# Patient Record
Sex: Male | Born: 1965 | Race: White | Hispanic: No | Marital: Single | Smoking: Never smoker
Health system: Southern US, Community
[De-identification: ages and names within clinical notes are randomized; demographics above are authoritative.]

## PROBLEM LIST (undated history)

## (undated) DIAGNOSIS — I10 Essential (primary) hypertension: Secondary | ICD-10-CM

## (undated) DIAGNOSIS — R569 Unspecified convulsions: Secondary | ICD-10-CM

## (undated) DIAGNOSIS — I639 Cerebral infarction, unspecified: Secondary | ICD-10-CM

## (undated) HISTORY — PX: NERVE SURGERY: SHX1016

## (undated) HISTORY — PX: FRACTURE SURGERY: SHX138

---

## 2006-12-08 ENCOUNTER — Ambulatory Visit: Payer: Self-pay | Admitting: Family Medicine

## 2006-12-13 ENCOUNTER — Ambulatory Visit: Payer: Self-pay | Admitting: Family Medicine

## 2009-11-28 ENCOUNTER — Ambulatory Visit: Payer: Self-pay | Admitting: Neurology

## 2009-12-11 ENCOUNTER — Ambulatory Visit: Payer: Self-pay | Admitting: Neurology

## 2010-07-16 ENCOUNTER — Emergency Department: Payer: Self-pay | Admitting: Unknown Physician Specialty

## 2010-09-09 ENCOUNTER — Ambulatory Visit: Payer: Self-pay | Admitting: Internal Medicine

## 2010-09-10 ENCOUNTER — Ambulatory Visit: Payer: Self-pay | Admitting: Internal Medicine

## 2011-09-06 ENCOUNTER — Emergency Department: Payer: Self-pay | Admitting: Emergency Medicine

## 2011-09-06 LAB — CARBAMAZEPINE LEVEL, TOTAL: Carbamazepine: 10.6 ug/mL (ref 4.0–12.0)

## 2011-12-21 ENCOUNTER — Emergency Department: Payer: Self-pay | Admitting: Emergency Medicine

## 2012-08-05 ENCOUNTER — Ambulatory Visit: Payer: Self-pay | Admitting: Internal Medicine

## 2015-01-27 ENCOUNTER — Other Ambulatory Visit: Payer: Self-pay | Admitting: Neurosurgery

## 2015-01-30 ENCOUNTER — Encounter (HOSPITAL_COMMUNITY): Payer: Self-pay

## 2015-01-30 ENCOUNTER — Encounter (HOSPITAL_COMMUNITY)
Admission: RE | Admit: 2015-01-30 | Discharge: 2015-01-30 | Disposition: A | Discharge status: Home / Self Care | Payer: Medicare Other | Source: Ambulatory Visit | Attending: Neurosurgery | Admitting: Neurosurgery

## 2015-01-30 DIAGNOSIS — Z79899 Other long term (current) drug therapy: Secondary | ICD-10-CM | POA: Diagnosis not present

## 2015-01-30 DIAGNOSIS — Z01818 Encounter for other preprocedural examination: Secondary | ICD-10-CM | POA: Diagnosis present

## 2015-01-30 DIAGNOSIS — G40209 Localization-related (focal) (partial) symptomatic epilepsy and epileptic syndromes with complex partial seizures, not intractable, without status epilepticus: Secondary | ICD-10-CM | POA: Insufficient documentation

## 2015-01-30 DIAGNOSIS — Z01812 Encounter for preprocedural laboratory examination: Secondary | ICD-10-CM | POA: Diagnosis not present

## 2015-01-30 HISTORY — DX: Unspecified convulsions: R56.9

## 2015-01-30 LAB — BASIC METABOLIC PANEL
Anion gap: 7 (ref 5–15)
BUN: 5 mg/dL — ABNORMAL LOW (ref 6–20)
CALCIUM: 9.3 mg/dL (ref 8.9–10.3)
CO2: 30 mmol/L (ref 22–32)
CREATININE: 0.69 mg/dL (ref 0.61–1.24)
Chloride: 91 mmol/L — ABNORMAL LOW (ref 101–111)
GFR calc non Af Amer: >60 mL/min (ref >60)
Glucose, Bld: 96 mg/dL (ref 65–99)
Potassium: 4.1 mmol/L (ref 3.5–5.1)
SODIUM: 128 mmol/L — AB (ref 135–145)

## 2015-01-30 LAB — CBC
HEMATOCRIT: 40.7 % (ref 39.0–52.0)
Hemoglobin: 13.8 g/dL (ref 13.0–17.0)
MCH: 29.7 pg (ref 26.0–34.0)
MCHC: 33.9 g/dL (ref 30.0–36.0)
MCV: 87.5 fL (ref 78.0–100.0)
PLATELETS: 354 10*3/uL (ref 150–400)
RBC: 4.65 MIL/uL (ref 4.22–5.81)
RDW: 13.1 % (ref 11.5–15.5)
WBC: 6.5 10*3/uL (ref 4.0–10.5)

## 2015-01-30 NOTE — Pre-Procedure Instructions (Signed)
    JARE BIGOS  01/30/2015      [facility], [place] [address] Phone: [phone] Fax: [phone]    Your procedure is scheduled on February 03, 2015  Report to [facility] Admitting at 9:30 A.M.  Call this number if you have problems the morning of surgery:  [phone]   Remember:  Do not eat food or drink liquids after midnight.  Take these medicines the morning of surgery with A SIP OF WATER :carbamazepine (TEGRETOL XR), lamoTRIgine (LAMICTAL)   STOP ASPIRIN, NSAIDS (ALEVE, ADVIL, IBUPROFEN) HERBAL MEDICATIONS ONE WEEK PRIOR TO SURGERY   Do not wear jewelry.  Do not wear lotions, powders, or colognes.  You may NOT wear deodorant.  Men may shave face and neck.  Do not bring valuables to the hospital.  [facility] is not responsible for any belongings or valuables.  Contacts, dentures or bridgework may not be worn into surgery.  Leave your suitcase in the car.  After surgery it may be brought to your room.  For patients admitted to the hospital, discharge time will be determined by your treatment team.  Patients discharged the day of surgery will not be allowed to drive home.   Name and phone number of your driver:    Special instructions:  "PREPARING FOR SURGERY"  Please read over the following fact sheets that you were given. Pain Booklet, Coughing and Deep Breathing and Surgical Site Infection Prevention
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
## 2015-02-03 ENCOUNTER — Ambulatory Visit (HOSPITAL_COMMUNITY): Payer: Medicare Other | Admitting: Vascular Surgery

## 2015-02-03 ENCOUNTER — Ambulatory Visit (HOSPITAL_COMMUNITY)
Admission: RE | Admit: 2015-02-03 | Discharge: 2015-02-03 | Disposition: A | Discharge status: Home / Self Care | Payer: Medicare Other | Source: Ambulatory Visit | Attending: Neurosurgery | Admitting: Neurosurgery

## 2015-02-03 ENCOUNTER — Encounter (HOSPITAL_COMMUNITY)
Admission: RE | Disposition: A | Discharge status: Home / Self Care | Payer: Self-pay | Source: Ambulatory Visit | Attending: Neurosurgery

## 2015-02-03 DIAGNOSIS — Z79899 Other long term (current) drug therapy: Secondary | ICD-10-CM | POA: Insufficient documentation

## 2015-02-03 DIAGNOSIS — G40919 Epilepsy, unspecified, intractable, without status epilepticus: Secondary | ICD-10-CM | POA: Insufficient documentation

## 2015-02-03 HISTORY — PX: VAGUS NERVE STIMULATOR INSERTION: SHX348

## 2015-02-03 LAB — POCT I-STAT 4, (NA,K, GLUC, HGB,HCT)
GLUCOSE: 94 mg/dL (ref 65–99)
HEMATOCRIT: 47 % (ref 39.0–52.0)
HEMOGLOBIN: 16 g/dL (ref 13.0–17.0)
Potassium: 3.7 mmol/L (ref 3.5–5.1)
Sodium: 129 mmol/L — ABNORMAL LOW (ref 135–145)

## 2015-02-03 SURGERY — VAGAL NERVE STIMULATOR IMPLANT
Anesthesia: General

## 2015-02-03 MED ORDER — PHENYLEPHRINE HCL 10 MG/ML IJ SOLN
10.0000 mg | INTRAMUSCULAR | Status: DC | PRN
  Administered 2015-02-03: 15 ug/min via INTRAVENOUS

## 2015-02-03 MED ORDER — BUPIVACAINE HCL 0.5 % IJ SOLN
INTRAMUSCULAR | Status: DC | PRN
  Administered 2015-02-03: 18 mL

## 2015-02-03 MED ORDER — OXYCODONE-ACETAMINOPHEN 5-325 MG PO TABS: 1.0000 | ORAL_TABLET | Freq: Four times a day (QID) | ORAL | Status: DC | PRN

## 2015-02-03 MED ORDER — THROMBIN 5000 UNITS EX SOLR
CUTANEOUS | Status: DC | PRN
  Administered 2015-02-03 (×2): 5000 [IU] via TOPICAL

## 2015-02-03 MED ORDER — ROCURONIUM BROMIDE 100 MG/10ML IV SOLN
INTRAVENOUS | Status: DC | PRN
  Administered 2015-02-03: 50 mg via INTRAVENOUS

## 2015-02-03 MED ORDER — CEFAZOLIN SODIUM-DEXTROSE 2-3 GM-% IV SOLR
2.0000 g | INTRAVENOUS | Status: AC
  Administered 2015-02-03: 2 g via INTRAVENOUS

## 2015-02-03 MED ORDER — SUGAMMADEX SODIUM 200 MG/2ML IV SOLN
2.0000 mg/kg | Freq: Once | INTRAVENOUS | Status: DC
  Filled 2015-02-03: qty 2

## 2015-02-03 MED ORDER — SUGAMMADEX SODIUM 200 MG/2ML IV SOLN
INTRAVENOUS | Status: DC | PRN
  Administered 2015-02-03: 180 mg via INTRAVENOUS

## 2015-02-03 MED ORDER — PROPOFOL 10 MG/ML IV BOLUS
INTRAVENOUS | Status: AC
  Filled 2015-02-03: qty 40

## 2015-02-03 MED ORDER — ARTIFICIAL TEARS OP OINT
TOPICAL_OINTMENT | OPHTHALMIC | Status: DC | PRN
  Administered 2015-02-03: 1 via OPHTHALMIC

## 2015-02-03 MED ORDER — PROPOFOL 10 MG/ML IV BOLUS
INTRAVENOUS | Status: DC | PRN
  Administered 2015-02-03: 160 mg via INTRAVENOUS

## 2015-02-03 MED ORDER — CEFAZOLIN SODIUM-DEXTROSE 2-3 GM-% IV SOLR
INTRAVENOUS | Status: AC
  Filled 2015-02-03: qty 50

## 2015-02-03 MED ORDER — OXYCODONE-ACETAMINOPHEN 5-325 MG PO TABS
ORAL_TABLET | ORAL | Status: AC
  Filled 2015-02-03: qty 1

## 2015-02-03 MED ORDER — FENTANYL CITRATE (PF) 100 MCG/2ML IJ SOLN
INTRAMUSCULAR | Status: DC | PRN
  Administered 2015-02-03: 150 ug via INTRAVENOUS
  Administered 2015-02-03 (×2): 50 ug via INTRAVENOUS

## 2015-02-03 MED ORDER — HEMOSTATIC AGENTS (NO CHARGE) OPTIME
TOPICAL | Status: DC | PRN
  Administered 2015-02-03: 1 via TOPICAL

## 2015-02-03 MED ORDER — SODIUM CHLORIDE 0.9 % IR SOLN
Status: DC | PRN
  Administered 2015-02-03: 13:00:00

## 2015-02-03 MED ORDER — FENTANYL CITRATE (PF) 250 MCG/5ML IJ SOLN
INTRAMUSCULAR | Status: AC
  Filled 2015-02-03: qty 5

## 2015-02-03 MED ORDER — MIDAZOLAM HCL 2 MG/2ML IJ SOLN
INTRAMUSCULAR | Status: AC
  Filled 2015-02-03: qty 2

## 2015-02-03 MED ORDER — LACTATED RINGERS IV SOLN
INTRAVENOUS | Status: DC
  Administered 2015-02-03 (×2): via INTRAVENOUS

## 2015-02-03 MED ORDER — PHENYLEPHRINE HCL 10 MG/ML IJ SOLN
INTRAMUSCULAR | Status: DC | PRN
  Administered 2015-02-03: 40 ug via INTRAVENOUS

## 2015-02-03 MED ORDER — LIDOCAINE-EPINEPHRINE 1 %-1:100000 IJ SOLN
INTRAMUSCULAR | Status: DC | PRN
  Administered 2015-02-03: 18 mL

## 2015-02-03 MED ORDER — ONDANSETRON HCL 4 MG/2ML IJ SOLN
INTRAMUSCULAR | Status: DC | PRN
  Administered 2015-02-03: 4 mg via INTRAVENOUS

## 2015-02-03 SURGICAL SUPPLY — 56 items
BAG DECANTER FOR FLEXI CONT (MISCELLANEOUS) ×3 IMPLANT
BENZOIN TINCTURE PRP APPL 2/3 (GAUZE/BANDAGES/DRESSINGS) IMPLANT
BLADE CLIPPER SURG (BLADE) IMPLANT
BLADE SURG 11 STRL SS (BLADE) ×3 IMPLANT
CANISTER SUCT 3000ML PPV (MISCELLANEOUS) ×3 IMPLANT
CLOSURE WOUND 1/2 X4 (GAUZE/BANDAGES/DRESSINGS)
DECANTER SPIKE VIAL GLASS SM (MISCELLANEOUS) ×3 IMPLANT
DRAPE LAPAROTOMY 100X72 PEDS (DRAPES) ×3 IMPLANT
DRAPE MICROSCOPE LEICA (MISCELLANEOUS) IMPLANT
DRAPE POUCH INSTRU U-SHP 10X18 (DRAPES) ×3 IMPLANT
DRAPE PROXIMA HALF (DRAPES) IMPLANT
DRSG OPSITE POSTOP 3X4 (GAUZE/BANDAGES/DRESSINGS) ×3 IMPLANT
DRSG OPSITE POSTOP 4X6 (GAUZE/BANDAGES/DRESSINGS) ×6 IMPLANT
DRSG TEGADERM 4X4.75 (GAUZE/BANDAGES/DRESSINGS) ×3 IMPLANT
DURAPREP 6ML APPLICATOR 50/CS (WOUND CARE) ×3 IMPLANT
ELECT COATED BLADE 2.86 ST (ELECTRODE) ×3 IMPLANT
ELECT REM PT RETURN 9FT ADLT (ELECTROSURGICAL) ×3
ELECTRODE REM PT RTRN 9FT ADLT (ELECTROSURGICAL) ×1 IMPLANT
GAUZE SPONGE 4X4 16PLY XRAY LF (GAUZE/BANDAGES/DRESSINGS) IMPLANT
GENERATOR MODEL 106 ASPIRE (Neuro Prosthesis/Implant) ×3 IMPLANT
GLOVE BIOGEL PI IND STRL 7.5 (GLOVE) ×1 IMPLANT
GLOVE BIOGEL PI INDICATOR 7.5 (GLOVE) ×2
GLOVE ECLIPSE 7.0 STRL STRAW (GLOVE) ×3 IMPLANT
GLOVE EXAM NITRILE LRG STRL (GLOVE) IMPLANT
GLOVE EXAM NITRILE MD LF STRL (GLOVE) IMPLANT
GLOVE EXAM NITRILE XL STR (GLOVE) IMPLANT
GLOVE EXAM NITRILE XS STR PU (GLOVE) IMPLANT
GOWN STRL REUS W/ TWL LRG LVL3 (GOWN DISPOSABLE) ×2 IMPLANT
GOWN STRL REUS W/ TWL XL LVL3 (GOWN DISPOSABLE) IMPLANT
GOWN STRL REUS W/TWL 2XL LVL3 (GOWN DISPOSABLE) IMPLANT
GOWN STRL REUS W/TWL LRG LVL3 (GOWN DISPOSABLE) ×4
GOWN STRL REUS W/TWL XL LVL3 (GOWN DISPOSABLE)
KIT BASIN OR (CUSTOM PROCEDURE TRAY) ×3 IMPLANT
KIT ROOM TURNOVER OR (KITS) ×3 IMPLANT
LEAD PERENNIAFLEX 2-3 304 (Neuro Prosthesis/Implant) ×3 IMPLANT
LIQUID BAND (GAUZE/BANDAGES/DRESSINGS) ×3 IMPLANT
LOOP VESSEL MAXI BLUE (MISCELLANEOUS) IMPLANT
LOOP VESSEL MINI RED (MISCELLANEOUS) ×6 IMPLANT
NEEDLE HYPO 25X1 1.5 SAFETY (NEEDLE) ×3 IMPLANT
NS IRRIG 1000ML POUR BTL (IV SOLUTION) ×3 IMPLANT
PACK LAMINECTOMY NEURO (CUSTOM PROCEDURE TRAY) ×3 IMPLANT
PAD ARMBOARD 7.5X6 YLW CONV (MISCELLANEOUS) ×9 IMPLANT
RUBBERBAND STERILE (MISCELLANEOUS) IMPLANT
SPONGE INTESTINAL PEANUT (DISPOSABLE) IMPLANT
SPONGE SURGIFOAM ABS GEL SZ50 (HEMOSTASIS) ×3 IMPLANT
STRIP CLOSURE SKIN 1/2X4 (GAUZE/BANDAGES/DRESSINGS) IMPLANT
SUT ETHILON 3 0 FSL (SUTURE) IMPLANT
SUT NURALON 4 0 TR CR/8 (SUTURE) ×3 IMPLANT
SUT SILK 2 0 (SUTURE)
SUT SILK 2-0 18XBRD TIE 12 (SUTURE) IMPLANT
SUT VIC AB 3-0 SH 8-18 (SUTURE) ×6 IMPLANT
SUT VICRYL 3-0 RB1 18 ABS (SUTURE) ×9 IMPLANT
TOWEL OR 17X24 6PK STRL BLUE (TOWEL DISPOSABLE) ×3 IMPLANT
TOWEL OR 17X26 10 PK STRL BLUE (TOWEL DISPOSABLE) ×3 IMPLANT
TUNNELING TOOL (MISCELLANEOUS) ×3 IMPLANT
WATER STERILE IRR 1000ML POUR (IV SOLUTION) ×3 IMPLANT

## 2015-02-03 NOTE — Anesthesia Procedure Notes (Signed)
Procedure Name: Intubation Date/Time: 02/03/2015 1:26 PM Performed by: Merdis Delay Pre-anesthesia Checklist: Patient identified, Timeout performed, Emergency Drugs available, Suction available and Patient being monitored Patient Re-evaluated:Patient Re-evaluated prior to inductionOxygen Delivery Method: Circle system utilized Preoxygenation: Pre-oxygenation with 100% oxygen Intubation Type: IV induction Ventilation: Mask ventilation without difficulty Laryngoscope Size: Mac and 3 Grade View: Grade I Tube type: Oral Tube size: 7.0 mm Number of attempts: 1 Airway Equipment and Method: Stylet Placement Confirmation: ETT inserted through vocal cords under direct vision,  breath sounds checked- equal and bilateral,  positive ETCO2 and CO2 detector Secured at: 22 cm Tube secured with: Tape Dental Injury: Teeth and Oropharynx as per pre-operative assessment

## 2015-02-03 NOTE — Transfer of Care (Signed)
Immediate Anesthesia Transfer of Care Note  Patient: Jared Grimes  Procedure(s) Performed: Procedure(s): VAGAL NERVE STIMULATOR IMPLANT LEFT SIDED (N/A)  Patient Location: PACU  Anesthesia Type:General  Level of Consciousness: awake, oriented, sedated, patient cooperative and responds to stimulation  Airway & Oxygen Therapy: Patient Spontanous Breathing and Patient connected to nasal cannula oxygen  Post-op Assessment: Report given to RN and Post -op Vital signs reviewed and stable  Post vital signs: Reviewed and stable  Last Vitals:  Filed Vitals:   02/03/15 1023 02/03/15 1024  BP: 0/0 170/103  Pulse:  71  Temp: 36.4 C   Resp:  18    Complications: No apparent anesthesia complications

## 2015-02-03 NOTE — Progress Notes (Signed)
Sodium and BP from today reported to Dr Ermalene Postin.

## 2015-02-03 NOTE — Discharge Summary (Signed)
  Physician Discharge Summary  Patient ID: Jared Grimes MRN: [id number] DOB/AGE: 1965/06/02 49 y.o.  Admit date: 02/03/2015 Discharge date: 02/03/2015  Admission Diagnoses: Intractable epilepsy  Discharge Diagnoses: Same Active Problems:   * No active hospital problems. *   Discharged Condition: Stable  Hospital Course:  Mrs. Jared Grimes is a 49 y.o. male who underwent uncomplicated placement of a left VNS. He was at baseline postoperatively, was recovered in the PACU, and discharged home in stable condition.  Treatments: Surgery - Placement of Left VNS  Discharge Exam: Blood pressure 149/98, pulse 80, temperature 97.2 F (36.2 C), temperature source Oral, resp. rate 8, weight 87.544 kg (193 lb), SpO2 98 %. Awake, alert, oriented Speech fluent, appropriate CN grossly intact 5/5 BUE/BLE Wound c/d/i  Disposition: 01-Home or Self Care     Medication List    TAKE these medications        calcium-vitamin D 250-100 MG-UNIT tablet  Take 1 tablet by mouth 2 (two) times daily.     carbamazepine 200 MG 12 hr tablet  Commonly known as:  TEGRETOL XR  Take 600 mg by mouth 2 (two) times daily.     lamoTRIgine 200 MG tablet  Commonly known as:  LAMICTAL  Take 200 mg by mouth 2 (two) times daily.     oxyCODONE-acetaminophen 5-325 MG tablet  Commonly known as:  ROXICET  Take 1 tablet by mouth every 6 (six) hours as needed for severe pain.           Follow-up Information    Follow up with [facility], Tomma Ehinger, C, MD In 2 weeks.   Specialty:  Neurosurgery   Contact information:   [address] [place] [address] [phone]       Signed: Jairo Ben 02/03/2015, 5:20 PM

## 2015-02-03 NOTE — Anesthesia Preprocedure Evaluation (Signed)
Anesthesia Evaluation  Patient identified by MRN, date of birth, ID band Patient awake    Reviewed: Allergy & Precautions, NPO status , Patient's Chart, lab work & pertinent test results  History of Anesthesia Complications Negative for: history of anesthetic complications  Airway Mallampati: II  TM Distance: >3 FB Neck ROM: Full    Dental no notable dental hx.    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   Neuromuscular disease negative psych ROS   GI/Hepatic negative GI ROS, Neg liver ROS,   Endo/Other  negative endocrine ROS  Renal/GU negative Renal ROS     Musculoskeletal negative musculoskeletal ROS (+)   Abdominal   Peds  Hematology negative hematology ROS (+)   Anesthesia Other Findings Healed trach site  Reproductive/Obstetrics                             Anesthesia Physical Anesthesia Plan  ASA: II  Anesthesia Plan: General   Post-op Pain Management:    Induction: Intravenous  Airway Management Planned: Oral ETT  Additional Equipment: None  Intra-op Plan:   Post-operative Plan: Extubation in OR  Informed Consent: I have reviewed the patients History and Physical, chart, labs and discussed the procedure including the risks, benefits and alternatives for the proposed anesthesia with the patient or authorized representative who has indicated his/her understanding and acceptance.   Dental advisory given  Plan Discussed with: CRNA and Surgeon  Anesthesia Plan Comments:         Anesthesia Quick Evaluation

## 2015-02-03 NOTE — Discharge Instructions (Signed)
Ok to Games developer. Remove transparent dressing after 48 hours. Slowly resume normal activities

## 2015-02-03 NOTE — Op Note (Signed)
PREOP DIAGNOSIS: Medically intractable epilepsy   POSTOP DIAGNOSIS: Same  PROCEDURE: 1. Placement of left Vagal nerve stimulator  SURGEON: Dr. Consuella Lose, MD  ASSISTANT: Dr. Cyndy Freeze, MD  ANESTHESIA: General Endotracheal  EBL: 50cc  SPECIMENS: None  DRAINS: None  COMPLICATIONS: None immediate  CONDITION: Hemodynamically stable to PACU  HISTORY: Jared Grimes is a 49 y.o. male who was initially seen in the outpatient clinic as a referral from the neurologist for medically intractable epilepsy. The patient appeared to be a good candidate for the procedure. The risks and benefits of the surgery were reviewed in detail. After all questions were answered, informed consent was obtained.  PROCEDURE IN DETAIL: After informed consent was obtained and witnessed, the patient was brought to the operating room. After induction of general anesthesia, the patient was positioned on the operative table in the supine position. All pressure points were meticulously padded. Skin incisions were then marked out and prepped and draped in the usual sterile fashion.  After timeout was conducted, skin incision was infiltrated with local anesthetic with epinephrine. Skin incision was then made sharply, and Bovie electrocautery was used to dissect the subcutaneous tissue. The platysma muscle was identified and incised. The platysma was then undermined. The medial border of the sternocleidomastoid muscle was identified, and dissection was carried out utilizing natural tissue planes of the neck until the carotid sheath was identified. The jugular vein was initially identified and the carotid sheath was incised. The vagus nerve was then identified running between the carotid and jugular. The vagus nerve was circumferentially dissected for length of a proximally 3-1/2 cm. The infraclavicular incision was then made, and Bovie electrocautery was used to dissect the subcutaneous tissue. A subcutaneous pocket  was then made. A subcutaneous tunneler was then passed from the neck to the infraclavicular incision. The vagal stimulator leads were then tunneled. The leads were then placed on the vagus nerve. A relaxing curl was then placed, and the leaves were tacked to the sternocleidomastoid muscle. The generator was then connected to the leads, and testing was done to confirm normal impedance, and good placement. The generator was then placed in the subcutaneous pocket. Wounds were then irrigated with copious amounts of normal saline irrigation. The platysma was closed using interrupted 3-0 Vicryl stitches. Subcutaneous layer in the infraclavicular incision was closed using interrupted 3-0 Vicryl stitches. Skin was then closed using interrupted 3-0 Vicryl, and a layer of Dermabond was applied. A final check of the system was again carried out which showed the system to be functioning normally, with good lead placement, and normal impedance.  At the end of the case all sponge, needle, and instrument counts were correct. The patient was then extubated, transferred to the stretcher, and taken to the postanesthesia care unit in stable hemodynamic condition.

## 2015-02-03 NOTE — Anesthesia Postprocedure Evaluation (Signed)
Anesthesia Post Note  Patient: Jared Grimes  Procedure(s) Performed: Procedure(s) (LRB): VAGAL NERVE STIMULATOR IMPLANT LEFT SIDED (N/A)  Patient location during evaluation: PACU Anesthesia Type: General Level of consciousness: awake Pain management: pain level controlled Vital Signs Assessment: post-procedure vital signs reviewed and stable Respiratory status: spontaneous breathing Cardiovascular status: stable Postop Assessment: no signs of nausea or vomiting Anesthetic complications: no    Last Vitals:  Filed Vitals:   02/03/15 1524 02/03/15 1530  BP:    Pulse: 97 83  Temp:    Resp: 10 11    Last Pain:  Filed Vitals:   02/03/15 1534  PainSc: 0-No pain                 Jared Grimes

## 2015-02-03 NOTE — H&P (Signed)
CC:  Medically intractable epilepsy  HPI: Jared Grimes is a 49 year old man I'm seeing at the request of Dr. Manuella Ghazi with medically intractable epilepsy. Unfortunately, the patient is not able to provide detailed history regarding his seizures, states that he has not had a seizure in more than a year. He is also unable to provide any real estimate of his seizure frequency. He does come in today with a friend, and history is obtained from him as well as records received from Dr. Trena Platt office. He apparently has recently undergone EEG which demonstrated multiple episodes of spike and wave activity during the short period of recording. He is currently maintained on Tegretol and lamotrigine. He does state that he has previously been seen at [facility], and was told that he was not really a candidate for an operation to cure his seizures.   PMH: Past Medical History  Diagnosis Date  . Seizures (HCC)     PSH: Past Surgical History  Procedure Laterality Date  . Nerve surgery      SH: Social History  Substance Use Topics  . Smoking status: Never Smoker   . Smokeless tobacco: Not on file  . Alcohol Use: No    MEDS: Prior to Admission medications   Medication Sig Start Date End Date Taking? Authorizing Provider  calcium-vitamin D 250-100 MG-UNIT tablet Take 1 tablet by mouth 2 (two) times daily.   Yes Historical Provider, MD  carbamazepine (TEGRETOL XR) 200 MG 12 hr tablet Take 600 mg by mouth 2 (two) times daily. 01/23/15  Yes Historical Provider, MD  lamoTRIgine (LAMICTAL) 200 MG tablet Take 200 mg by mouth 2 (two) times daily.   Yes Historical Provider, MD    ALLERGY: No Known Allergies  ROS: ROS  NEUROLOGIC EXAM: Awake, alert, oriented Memory and concentration grossly intact Speech fluent, appropriate CN grossly intact Motor exam: Upper Extremities Deltoid Bicep Tricep Grip  Right 5/5 5/5 5/5 5/5  Left 5/5 5/5 5/5 5/5   Lower Extremity IP Quad PF DF EHL  Right 5/5 5/5 5/5 5/5 5/5   Left 5/5 5/5 5/5 5/5 5/5   Sensation grossly intact to LT  IMPRESSION: 49 year old man with medically intractable complex partial seizures. He would be a candidate for vagal nerve similar replacement  PLAN: We will plan on proceeding with placement of a left vagal nerve stimulator   I have reviewed the procedure with the patient and his friend. Risks and benefits of the surgery were explained in detail including the risk of infection, vascular injury, neck hematoma, and nerve injury. With an understanding of these risks, the patient agreed to proceed. All questions were answered.

## 2015-02-04 ENCOUNTER — Encounter (HOSPITAL_COMMUNITY): Payer: Self-pay | Admitting: Neurosurgery

## 2015-10-21 ENCOUNTER — Encounter: Payer: Self-pay | Admitting: *Deleted

## 2015-10-22 ENCOUNTER — Ambulatory Visit: Payer: Medicare Other | Admitting: Certified Registered"

## 2015-10-22 ENCOUNTER — Ambulatory Visit
Admission: RE | Admit: 2015-10-22 | Discharge: 2015-10-22 | Disposition: A | Discharge status: Home / Self Care | Payer: Medicare Other | Source: Ambulatory Visit | Attending: Gastroenterology | Admitting: Gastroenterology

## 2015-10-22 ENCOUNTER — Encounter: Payer: Self-pay | Admitting: *Deleted

## 2015-10-22 ENCOUNTER — Encounter
Admission: RE | Disposition: A | Discharge status: Home / Self Care | Payer: Self-pay | Source: Ambulatory Visit | Attending: Gastroenterology

## 2015-10-22 DIAGNOSIS — Z79891 Long term (current) use of opiate analgesic: Secondary | ICD-10-CM | POA: Insufficient documentation

## 2015-10-22 DIAGNOSIS — Z791 Long term (current) use of non-steroidal anti-inflammatories (NSAID): Secondary | ICD-10-CM | POA: Diagnosis not present

## 2015-10-22 DIAGNOSIS — Z1211 Encounter for screening for malignant neoplasm of colon: Secondary | ICD-10-CM | POA: Insufficient documentation

## 2015-10-22 DIAGNOSIS — R569 Unspecified convulsions: Secondary | ICD-10-CM | POA: Insufficient documentation

## 2015-10-22 DIAGNOSIS — G540 Brachial plexus disorders: Secondary | ICD-10-CM | POA: Diagnosis not present

## 2015-10-22 DIAGNOSIS — Z87820 Personal history of traumatic brain injury: Secondary | ICD-10-CM | POA: Insufficient documentation

## 2015-10-22 DIAGNOSIS — D123 Benign neoplasm of transverse colon: Secondary | ICD-10-CM | POA: Insufficient documentation

## 2015-10-22 DIAGNOSIS — Z79899 Other long term (current) drug therapy: Secondary | ICD-10-CM | POA: Insufficient documentation

## 2015-10-22 DIAGNOSIS — I10 Essential (primary) hypertension: Secondary | ICD-10-CM | POA: Diagnosis not present

## 2015-10-22 HISTORY — PX: COLONOSCOPY WITH PROPOFOL: SHX5780

## 2015-10-22 HISTORY — DX: Essential (primary) hypertension: I10

## 2015-10-22 SURGERY — COLONOSCOPY WITH PROPOFOL
Anesthesia: General

## 2015-10-22 MED ORDER — PROPOFOL 10 MG/ML IV BOLUS
INTRAVENOUS | Status: DC | PRN
  Administered 2015-10-22: 30 mg via INTRAVENOUS
  Administered 2015-10-22: 70 mg via INTRAVENOUS

## 2015-10-22 MED ORDER — LIDOCAINE 2% (20 MG/ML) 5 ML SYRINGE
INTRAMUSCULAR | Status: DC | PRN
  Administered 2015-10-22: 50 mg via INTRAVENOUS

## 2015-10-22 MED ORDER — PROPOFOL 500 MG/50ML IV EMUL
INTRAVENOUS | Status: DC | PRN
  Administered 2015-10-22: 120 ug/kg/min via INTRAVENOUS

## 2015-10-22 MED ORDER — SODIUM CHLORIDE 0.9 % IV SOLN
INTRAVENOUS | Status: DC
  Administered 2015-10-22: 09:00:00 via INTRAVENOUS

## 2015-10-22 MED ORDER — SODIUM CHLORIDE 0.9 % IV SOLN: INTRAVENOUS | Status: DC

## 2015-10-22 MED ORDER — MIDAZOLAM HCL 5 MG/5ML IJ SOLN
INTRAMUSCULAR | Status: DC | PRN
  Administered 2015-10-22: 1 mg via INTRAVENOUS

## 2015-10-22 NOTE — H&P (Signed)
Outpatient short stay form Pre-procedure 10/22/2015 11:06 AM Lollie Sails MD  Primary Physician: Dr. Juluis Pitch  Reason for visit:  Colonoscopy  History of present illness:  Patient is a 50 year old male presenting today for his initial screening colonoscopy. He tolerated his prep well. He denies use of any aspirin or blood thinning agents.    Current Facility-Administered Medications:  .  0.9 %  sodium chloride infusion, , Intravenous, Continuous, Lollie Sails, MD, Last Rate: 20 mL/hr at 10/22/15 0924 .  0.9 %  sodium chloride infusion, , Intravenous, Continuous, Lollie Sails, MD  Prescriptions Prior to Admission  Medication Sig Dispense Refill Last Dose  . atorvastatin (LIPITOR) 20 MG tablet Take 20 mg by mouth daily.   10/21/2015 at Unknown time  . calcium-vitamin D 250-100 MG-UNIT tablet Take 1 tablet by mouth 2 (two) times daily.   10/21/2015 at Unknown time  . carbamazepine (TEGRETOL XR) 200 MG 12 hr tablet Take 600 mg by mouth 2 (two) times daily.  0 10/22/2015 at 0800  . HYDROcodone-acetaminophen (NORCO/VICODIN) 5-325 MG tablet Take 1 tablet by mouth every 8 (eight) hours as needed for moderate pain.     . meloxicam (MOBIC) 15 MG tablet Take 15 mg by mouth daily.   Past Week at Unknown time  . metoprolol (LOPRESSOR) 50 MG tablet Take 50 mg by mouth 2 (two) times daily.   10/21/2015 at Unknown time  . lamoTRIgine (LAMICTAL) 200 MG tablet Take 200 mg by mouth 2 (two) times daily.   02/03/2015 at Unknown time  . oxyCODONE-acetaminophen (ROXICET) 5-325 MG tablet Take 1 tablet by mouth every 6 (six) hours as needed for severe pain. 30 tablet 0      No Known Allergies   Past Medical History:  Diagnosis Date  . Hypertension   . Seizures ([place])     Review of systems:      Physical Exam    Heart and lungs: Regular rate and rhythm without rub or gallop, lungs are bilaterally clear.    HEENT: Normocephalic atraumatic eyes are anicteric    Other:    Pertinant exam for procedure: Soft nontender nondistended bowel sounds positive normoactive.    Planned proceedures: Colonoscopy and indicated procedures. I have discussed the risks benefits and complications of procedures to include not limited to bleeding, infection, perforation and the risk of sedation and the patient wishes to proceed.    Lollie Sails, MD Gastroenterology 10/22/2015  11:06 AM

## 2015-10-22 NOTE — Anesthesia Preprocedure Evaluation (Signed)
Anesthesia Evaluation  Patient identified by MRN, date of birth, ID band Patient awake    Reviewed: Allergy & Precautions, NPO status , Patient's Chart, lab work & pertinent test results, reviewed documented beta blocker date and time   History of Anesthesia Complications Negative for: history of anesthetic complications  Airway Mallampati: II  TM Distance: >3 FB Neck ROM: Full    Dental no notable dental hx.    Pulmonary neg pulmonary ROS,    breath sounds clear to auscultation       Cardiovascular hypertension, Pt. on medications and Pt. 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   Neuromuscular disease negative psych ROS   GI/Hepatic negative GI ROS, Neg liver ROS,   Endo/Other  negative endocrine ROS  Renal/GU negative Renal ROS  negative genitourinary   Musculoskeletal negative musculoskeletal ROS (+)   Abdominal   Peds negative pediatric ROS (+)  Hematology negative hematology ROS (+)   Anesthesia Other Findings Healed trach site  Reproductive/Obstetrics negative OB ROS                             Anesthesia Physical  Anesthesia Plan  ASA: II  Anesthesia Plan: General   Post-op Pain Management:    Induction: Intravenous  Airway Management Planned: Nasal Cannula  Additional Equipment: None  Intra-op Plan:   Post-operative Plan:   Informed Consent: I have reviewed the patients History and Physical, chart, labs and discussed the procedure including the risks, benefits and alternatives for the proposed anesthesia with the patient or authorized representative who has indicated his/her understanding and acceptance.   Dental advisory given  Plan Discussed with: CRNA and Surgeon  Anesthesia Plan Comments:          Anesthesia Quick Evaluation

## 2015-10-22 NOTE — Transfer of Care (Signed)
Immediate Anesthesia Transfer of Care Note  Patient: Jared Grimes  Procedure(s) Performed: Procedure(s): COLONOSCOPY WITH PROPOFOL (N/A)  Patient Location: Endoscopy Unit  Anesthesia Type:General  Level of Consciousness: awake and alert   Airway & Oxygen Therapy: Patient Spontanous Breathing and Patient connected to nasal cannula oxygen  Post-op Assessment: Report given to RN and Post -op Vital signs reviewed and stable  Post vital signs: Reviewed  Last Vitals:  Vitals:   10/22/15 0902 10/22/15 1140  BP: (!) 142/87 117/78  Pulse: 67 70  Resp: 18 12  Temp: (!) 35.6 C (!) 35.9 C    Last Pain:  Vitals:   10/22/15 0902  TempSrc: Tympanic         Complications: No apparent anesthesia complications

## 2015-10-22 NOTE — Anesthesia Postprocedure Evaluation (Signed)
Anesthesia Post Note  Patient: EURAL HOVORKA  Procedure(s) Performed: Procedure(s) (LRB): COLONOSCOPY WITH PROPOFOL (N/A)  Patient location during evaluation: PACU Anesthesia Type: General Level of consciousness: awake and alert and oriented Pain management: pain level controlled Vital Signs Assessment: post-procedure vital signs reviewed and stable Respiratory status: spontaneous breathing Cardiovascular status: blood pressure returned to baseline Anesthetic complications: no    Last Vitals:  Vitals:   10/22/15 1200 10/22/15 1210  BP: (!) 167/100 (!) 148/94  Pulse: 64 (!) 58  Resp: 12 18  Temp:      Last Pain:  Vitals:   10/22/15 1139  TempSrc: Tympanic                 Mayte Diers

## 2015-10-22 NOTE — Op Note (Signed)
[facility] Gastroenterology Patient Name: Jared Grimes Procedure Date: 10/22/2015 11:07 AM MRN: [id number] Account #: [id number] Date of Birth: 11-25-1965 Admit Type: Outpatient Age: 50 Room: [facility] ENDO ROOM 1 Gender: Male Note Status: Finalized Procedure:            Colonoscopy Indications:          Screening for colorectal malignant neoplasm, This is                        the patient's first colonoscopy Providers:            Lollie Sails, MD Referring MD:         Youlanda Roys. Lovie Macadamia, MD (Referring MD) Medicines:            Monitored Anesthesia Care Complications:        No immediate complications. Procedure:            Pre-Anesthesia Assessment:                       - ASA Grade Assessment: III - A patient with severe                        systemic disease.                       After obtaining informed consent, the colonoscope was                        passed under direct vision. Throughout the procedure,                        the patient's blood pressure, pulse, and oxygen                        saturations were monitored continuously. The                        Colonoscope was introduced through the anus and                        advanced to the the cecum, identified by appendiceal                        orifice and ileocecal valve. The colonoscopy was                        performed with moderate difficulty. Successful                        completion of the procedure was aided by using manual                        pressure. Findings:      A 3 mm polyp was found in the distal transverse colon. The polyp was       sessile. The polyp was removed with a cold biopsy forceps. Resection and       retrieval were complete.      A 5 mm polyp was found in the transverse colon. The polyp was       pedunculated. The polyp was removed with a cold snare. Resection and  retrieval were complete.      A 2 mm polyp was found in the transverse colon. The  polyp was sessile.       The polyp was removed with a cold biopsy forceps. Resection and       retrieval were complete.      The digital rectal exam was normal.      The retroflexed view of the distal rectum and anal verge was normal and       showed no anal or rectal abnormalities.      perirectal skin depigmentation, no overt rash or other abnormality noted Impression:           - One 3 mm polyp in the distal transverse colon,                        removed with a cold biopsy forceps. Resected and                        retrieved.                       - One 5 mm polyp in the transverse colon, removed with                        a cold snare. Resected and retrieved.                       - One 2 mm polyp in the transverse colon, removed with                        a cold biopsy forceps. Resected and retrieved.                       - The distal rectum and anal verge are normal on                        retroflexion view.                       - perirectal depigmentation Recommendation:       - Discharge patient to home.                       - Telephone GI clinic for pathology results in 1 week. Procedure Code(s):    --- Professional ---                       [phone], Colonoscopy, flexible; with removal of tumor(s),                        polyp(s), or other lesion(s) by snare technique                       45380, 43, Colonoscopy, flexible; with biopsy, single                        or multiple Diagnosis Code(s):    --- Professional ---                       Z12.11, Encounter for screening for malignant neoplasm  of colon                       D12.3, Benign neoplasm of transverse colon (hepatic                        flexure or splenic flexure) CPT copyright 2016 American Medical Association. All rights reserved. The codes documented in this report are preliminary and upon coder review may  be revised to meet current compliance requirements. Lollie Sails,  MD 10/22/2015 11:43:22 AM This report has been signed electronically. Number of Addenda: 0 Note Initiated On: 10/22/2015 11:07 AM Scope Withdrawal Time: 0 hours 8 minutes 12 seconds  Total Procedure Duration: 0 hours 23 minutes 25 seconds       [facility]

## 2015-10-23 ENCOUNTER — Encounter: Payer: Self-pay | Admitting: Gastroenterology

## 2015-10-23 LAB — SURGICAL PATHOLOGY

## 2016-02-07 ENCOUNTER — Encounter: Payer: Self-pay | Admitting: Medical Oncology

## 2016-02-07 ENCOUNTER — Emergency Department: Payer: Medicare Other

## 2016-02-07 ENCOUNTER — Observation Stay: Payer: Medicare Other

## 2016-02-07 ENCOUNTER — Observation Stay
Admission: EM | Admit: 2016-02-07 | Discharge: 2016-02-09 | Disposition: A | Discharge status: Home / Self Care | Payer: Medicare Other | Attending: Internal Medicine | Admitting: Internal Medicine

## 2016-02-07 DIAGNOSIS — E1165 Type 2 diabetes mellitus with hyperglycemia: Secondary | ICD-10-CM | POA: Insufficient documentation

## 2016-02-07 DIAGNOSIS — Z87820 Personal history of traumatic brain injury: Secondary | ICD-10-CM | POA: Diagnosis not present

## 2016-02-07 DIAGNOSIS — M6281 Muscle weakness (generalized): Secondary | ICD-10-CM

## 2016-02-07 DIAGNOSIS — Z7902 Long term (current) use of antithrombotics/antiplatelets: Secondary | ICD-10-CM | POA: Insufficient documentation

## 2016-02-07 DIAGNOSIS — I10 Essential (primary) hypertension: Secondary | ICD-10-CM | POA: Diagnosis not present

## 2016-02-07 DIAGNOSIS — G9389 Other specified disorders of brain: Secondary | ICD-10-CM | POA: Diagnosis not present

## 2016-02-07 DIAGNOSIS — I672 Cerebral atherosclerosis: Secondary | ICD-10-CM | POA: Insufficient documentation

## 2016-02-07 DIAGNOSIS — Z79899 Other long term (current) drug therapy: Secondary | ICD-10-CM | POA: Diagnosis not present

## 2016-02-07 DIAGNOSIS — E871 Hypo-osmolality and hyponatremia: Secondary | ICD-10-CM | POA: Diagnosis not present

## 2016-02-07 DIAGNOSIS — R29810 Facial weakness: Secondary | ICD-10-CM | POA: Diagnosis present

## 2016-02-07 DIAGNOSIS — G8314 Monoplegia of lower limb affecting left nondominant side: Secondary | ICD-10-CM | POA: Insufficient documentation

## 2016-02-07 DIAGNOSIS — R29898 Other symptoms and signs involving the musculoskeletal system: Secondary | ICD-10-CM

## 2016-02-07 DIAGNOSIS — I639 Cerebral infarction, unspecified: Secondary | ICD-10-CM

## 2016-02-07 DIAGNOSIS — R262 Difficulty in walking, not elsewhere classified: Secondary | ICD-10-CM

## 2016-02-07 DIAGNOSIS — Z8673 Personal history of transient ischemic attack (TIA), and cerebral infarction without residual deficits: Secondary | ICD-10-CM | POA: Insufficient documentation

## 2016-02-07 DIAGNOSIS — G40909 Epilepsy, unspecified, not intractable, without status epilepticus: Secondary | ICD-10-CM | POA: Diagnosis not present

## 2016-02-07 DIAGNOSIS — G459 Transient cerebral ischemic attack, unspecified: Principal | ICD-10-CM | POA: Insufficient documentation

## 2016-02-07 DIAGNOSIS — R531 Weakness: Secondary | ICD-10-CM | POA: Insufficient documentation

## 2016-02-07 DIAGNOSIS — Z7982 Long term (current) use of aspirin: Secondary | ICD-10-CM | POA: Diagnosis not present

## 2016-02-07 DIAGNOSIS — R739 Hyperglycemia, unspecified: Secondary | ICD-10-CM

## 2016-02-07 LAB — CBC WITH DIFFERENTIAL/PLATELET
BASOS ABS: 0 10*3/uL (ref 0–0.1)
Basophils Relative: 0 %
EOS PCT: 1 %
Eosinophils Absolute: 0.1 10*3/uL (ref 0–0.7)
HEMATOCRIT: 42.9 % (ref 40.0–52.0)
Hemoglobin: 14.7 g/dL (ref 13.0–18.0)
LYMPHS PCT: 12 %
Lymphs Abs: 1.3 10*3/uL (ref 1.0–3.6)
MCH: 31 pg (ref 26.0–34.0)
MCHC: 34.3 g/dL (ref 32.0–36.0)
MCV: 90.4 fL (ref 80.0–100.0)
MONO ABS: 1.4 10*3/uL — AB (ref 0.2–1.0)
MONOS PCT: 13 %
NEUTROS ABS: 7.7 10*3/uL — AB (ref 1.4–6.5)
Neutrophils Relative %: 74 %
PLATELETS: 257 10*3/uL (ref 150–440)
RBC: 4.75 MIL/uL (ref 4.40–5.90)
RDW: 13 % (ref 11.5–14.5)
WBC: 10.5 10*3/uL (ref 3.8–10.6)

## 2016-02-07 LAB — COMPREHENSIVE METABOLIC PANEL
ALBUMIN: 4.5 g/dL (ref 3.5–5.0)
ALK PHOS: 46 U/L (ref 38–126)
ALT: 29 U/L (ref 17–63)
AST: 30 U/L (ref 15–41)
Anion gap: 8 (ref 5–15)
BILIRUBIN TOTAL: 0.4 mg/dL (ref 0.3–1.2)
BUN: 13 mg/dL (ref 6–20)
CO2: 30 mmol/L (ref 22–32)
Calcium: 8.9 mg/dL (ref 8.9–10.3)
Chloride: 93 mmol/L — ABNORMAL LOW (ref 101–111)
Creatinine, Ser: 0.59 mg/dL — ABNORMAL LOW (ref 0.61–1.24)
GFR calc Af Amer: >60 mL/min (ref >60)
GFR calc non Af Amer: >60 mL/min (ref >60)
GLUCOSE: 109 mg/dL — AB (ref 65–99)
POTASSIUM: 4.1 mmol/L (ref 3.5–5.1)
SODIUM: 131 mmol/L — AB (ref 135–145)
TOTAL PROTEIN: 7.7 g/dL (ref 6.5–8.1)

## 2016-02-07 LAB — URINALYSIS, COMPLETE (UACMP) WITH MICROSCOPIC
BACTERIA UA: NONE SEEN
Bilirubin Urine: NEGATIVE
Glucose, UA: NEGATIVE mg/dL
Hgb urine dipstick: NEGATIVE
Ketones, ur: NEGATIVE mg/dL
Leukocytes, UA: NEGATIVE
Nitrite: NEGATIVE
PROTEIN: NEGATIVE mg/dL
SPECIFIC GRAVITY, URINE: 1.013 (ref 1.005–1.030)
SQUAMOUS EPITHELIAL / LPF: NONE SEEN
pH: 8 (ref 5.0–8.0)

## 2016-02-07 LAB — URINE DRUG SCREEN, QUALITATIVE (ARMC ONLY)
Amphetamines, Ur Screen: NOT DETECTED
BARBITURATES, UR SCREEN: NOT DETECTED
Benzodiazepine, Ur Scrn: NOT DETECTED
COCAINE METABOLITE, UR ~~LOC~~: NOT DETECTED
Cannabinoid 50 Ng, Ur ~~LOC~~: NOT DETECTED
MDMA (ECSTASY) UR SCREEN: NOT DETECTED
METHADONE SCREEN, URINE: NOT DETECTED
Opiate, Ur Screen: NOT DETECTED
Phencyclidine (PCP) Ur S: NOT DETECTED
TRICYCLIC, UR SCREEN: NOT DETECTED

## 2016-02-07 LAB — TROPONIN I: Troponin I: <0.03 ng/mL (ref <0.03)

## 2016-02-07 MED ORDER — PANTOPRAZOLE SODIUM 40 MG PO TBEC
40.0000 mg | DELAYED_RELEASE_TABLET | Freq: Every day | ORAL | Status: DC
  Administered 2016-02-07 – 2016-02-09 (×3): 40 mg via ORAL
  Filled 2016-02-07 (×3): qty 1

## 2016-02-07 MED ORDER — CARBAMAZEPINE ER 400 MG PO TB12: 800.0000 mg | ORAL_TABLET | Freq: Every day | ORAL | Status: DC

## 2016-02-07 MED ORDER — ZOLPIDEM TARTRATE 5 MG PO TABS
5.0000 mg | ORAL_TABLET | Freq: Every evening | ORAL | Status: DC | PRN
  Administered 2016-02-07 – 2016-02-08 (×2): 5 mg via ORAL
  Filled 2016-02-07 (×2): qty 1

## 2016-02-07 MED ORDER — CARBAMAZEPINE ER 200 MG PO TB12: 600.0000 mg | ORAL_TABLET | Freq: Two times a day (BID) | ORAL | Status: DC

## 2016-02-07 MED ORDER — ONDANSETRON HCL 4 MG PO TABS: 4.0000 mg | ORAL_TABLET | Freq: Four times a day (QID) | ORAL | Status: DC | PRN

## 2016-02-07 MED ORDER — ATORVASTATIN CALCIUM 20 MG PO TABS
20.0000 mg | ORAL_TABLET | Freq: Every day | ORAL | Status: DC
  Administered 2016-02-07: 20 mg via ORAL
  Filled 2016-02-07: qty 1

## 2016-02-07 MED ORDER — DOCUSATE SODIUM 100 MG PO CAPS
100.0000 mg | ORAL_CAPSULE | Freq: Two times a day (BID) | ORAL | Status: DC
  Administered 2016-02-07 – 2016-02-09 (×4): 100 mg via ORAL
  Filled 2016-02-07 (×4): qty 1

## 2016-02-07 MED ORDER — HYDROCODONE-ACETAMINOPHEN 5-325 MG PO TABS: 1.0000 | ORAL_TABLET | ORAL | Status: DC | PRN

## 2016-02-07 MED ORDER — HYDRALAZINE HCL 20 MG/ML IJ SOLN
10.0000 mg | INTRAMUSCULAR | Status: DC | PRN
  Administered 2016-02-07: 10 mg via INTRAVENOUS
  Filled 2016-02-07: qty 1

## 2016-02-07 MED ORDER — SODIUM CHLORIDE 0.9 % IV SOLN
INTRAVENOUS | Status: DC
  Administered 2016-02-07 – 2016-02-08 (×2): via INTRAVENOUS

## 2016-02-07 MED ORDER — ACETAMINOPHEN 325 MG PO TABS: 650.0000 mg | ORAL_TABLET | Freq: Four times a day (QID) | ORAL | Status: DC | PRN

## 2016-02-07 MED ORDER — ASPIRIN EC 81 MG PO TBEC
81.0000 mg | DELAYED_RELEASE_TABLET | Freq: Every day | ORAL | Status: DC
  Administered 2016-02-07 – 2016-02-09 (×3): 81 mg via ORAL
  Filled 2016-02-07 (×2): qty 1

## 2016-02-07 MED ORDER — ASPIRIN 81 MG PO CHEW
81.0000 mg | CHEWABLE_TABLET | Freq: Once | ORAL | Status: AC
  Administered 2016-02-07: 81 mg via ORAL
  Filled 2016-02-07: qty 1

## 2016-02-07 MED ORDER — CARBAMAZEPINE ER 200 MG PO TB12
600.0000 mg | ORAL_TABLET | Freq: Every day | ORAL | Status: DC
  Administered 2016-02-07 – 2016-02-08 (×2): 600 mg via ORAL
  Filled 2016-02-07 (×3): qty 3

## 2016-02-07 MED ORDER — ACETAMINOPHEN 650 MG RE SUPP: 650.0000 mg | Freq: Four times a day (QID) | RECTAL | Status: DC | PRN

## 2016-02-07 MED ORDER — LOSARTAN POTASSIUM 50 MG PO TABS
50.0000 mg | ORAL_TABLET | Freq: Every day | ORAL | Status: DC
  Administered 2016-02-07 – 2016-02-08 (×2): 50 mg via ORAL
  Filled 2016-02-07 (×2): qty 1

## 2016-02-07 MED ORDER — BISACODYL 10 MG RE SUPP: 10.0000 mg | Freq: Every day | RECTAL | Status: DC | PRN

## 2016-02-07 MED ORDER — HEPARIN SODIUM (PORCINE) 5000 UNIT/ML IJ SOLN
5000.0000 [IU] | Freq: Three times a day (TID) | INTRAMUSCULAR | Status: DC
  Administered 2016-02-07 – 2016-02-09 (×6): 5000 [IU] via SUBCUTANEOUS
  Filled 2016-02-07 (×6): qty 1

## 2016-02-07 MED ORDER — CARBAMAZEPINE ER 200 MG PO TB12
800.0000 mg | ORAL_TABLET | Freq: Every day | ORAL | Status: DC
  Administered 2016-02-08 – 2016-02-09 (×2): 800 mg via ORAL
  Filled 2016-02-07 (×2): qty 4

## 2016-02-07 MED ORDER — CARBAMAZEPINE ER 400 MG PO TB12
600.0000 mg | ORAL_TABLET | Freq: Every day | ORAL | Status: DC
  Filled 2016-02-07: qty 1

## 2016-02-07 MED ORDER — ONDANSETRON HCL 4 MG/2ML IJ SOLN: 4.0000 mg | Freq: Four times a day (QID) | INTRAMUSCULAR | Status: DC | PRN

## 2016-02-07 MED ORDER — METHYLPREDNISOLONE SODIUM SUCC 125 MG IJ SOLR
125.0000 mg | Freq: Once | INTRAMUSCULAR | Status: AC
  Administered 2016-02-07: 125 mg via INTRAVENOUS
  Filled 2016-02-07: qty 2

## 2016-02-07 MED ORDER — LAMOTRIGINE 100 MG PO TABS
200.0000 mg | ORAL_TABLET | Freq: Two times a day (BID) | ORAL | Status: DC
  Administered 2016-02-07 – 2016-02-09 (×4): 200 mg via ORAL
  Filled 2016-02-07 (×4): qty 2

## 2016-02-07 NOTE — ED Notes (Signed)
Patient transported to CT 

## 2016-02-07 NOTE — ED Notes (Signed)
Attending MD notified of BP 168/103. Orders received for 10mg  IV hydralazine Q4H PRN.

## 2016-02-07 NOTE — Progress Notes (Signed)
Pt c/o new numbness in left side of his face and was also unable to move the left leg. MD notified, received orders for Solu-Medrol IV. This RN gave the Solu-Medrol and about 15 minutes later the patient stated that numbness was better and could move his left leg again. Ammie Dalton, RN
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
## 2016-02-07 NOTE — ED Notes (Signed)
MRI assessment form completed. MRI called, notified pt has vagal nerve stimulator placed 01/2015. MRI unsure if pt will be able to receive test at this time. Tech states she will try to look up the specific implant device and call back.
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
## 2016-02-07 NOTE — ED Notes (Signed)
MRI tech called back and stated they cannot do the test with pt's stimulator implant. MD notified.
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
## 2016-02-07 NOTE — Care Management Obs Status (Signed)
[place] NOTIFICATION   Patient Details  Name: Jared Grimes MRN: [id number] Date of Birth: 09-10-1965   Medicare Observation Status Notification Given:  Yes    Ival Bible, RN 02/07/2016, 12:30 PM

## 2016-02-07 NOTE — Progress Notes (Signed)
Chaplain received a consult to visit pt in room 113. Provided information regarding an advanced directive.    02/07/16 1900  Clinical Encounter Type  Visited With Patient;Patient and family together  Visit Type Initial  Referral From Nurse  Consult/Referral To Chaplain  Spiritual Encounters  Spiritual Needs Other (Comment)
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
## 2016-02-08 ENCOUNTER — Observation Stay: Payer: Medicare Other

## 2016-02-08 ENCOUNTER — Observation Stay (HOSPITAL_BASED_OUTPATIENT_CLINIC_OR_DEPARTMENT_OTHER)
Admit: 2016-02-08 | Discharge: 2016-02-08 | Disposition: A | Discharge status: Home / Self Care | Payer: Medicare Other | Attending: Family Medicine | Admitting: Family Medicine

## 2016-02-08 DIAGNOSIS — G459 Transient cerebral ischemic attack, unspecified: Secondary | ICD-10-CM

## 2016-02-08 DIAGNOSIS — G40909 Epilepsy, unspecified, not intractable, without status epilepticus: Secondary | ICD-10-CM | POA: Diagnosis not present

## 2016-02-08 DIAGNOSIS — E871 Hypo-osmolality and hyponatremia: Secondary | ICD-10-CM

## 2016-02-08 DIAGNOSIS — R29898 Other symptoms and signs involving the musculoskeletal system: Secondary | ICD-10-CM | POA: Diagnosis not present

## 2016-02-08 DIAGNOSIS — R739 Hyperglycemia, unspecified: Secondary | ICD-10-CM

## 2016-02-08 LAB — COMPREHENSIVE METABOLIC PANEL
ALBUMIN: 3.6 g/dL (ref 3.5–5.0)
ALK PHOS: 44 U/L (ref 38–126)
ALT: 25 U/L (ref 17–63)
AST: 22 U/L (ref 15–41)
Anion gap: 7 (ref 5–15)
BILIRUBIN TOTAL: 0.5 mg/dL (ref 0.3–1.2)
BUN: 15 mg/dL (ref 6–20)
CALCIUM: 8.1 mg/dL — AB (ref 8.9–10.3)
CO2: 24 mmol/L (ref 22–32)
CREATININE: 0.53 mg/dL — AB (ref 0.61–1.24)
Chloride: 99 mmol/L — ABNORMAL LOW (ref 101–111)
GFR calc Af Amer: >60 mL/min (ref >60)
GLUCOSE: 105 mg/dL — AB (ref 65–99)
POTASSIUM: 3.9 mmol/L (ref 3.5–5.1)
Sodium: 130 mmol/L — ABNORMAL LOW (ref 135–145)
TOTAL PROTEIN: 6.7 g/dL (ref 6.5–8.1)

## 2016-02-08 LAB — LIPID PANEL
Cholesterol: 165 mg/dL (ref 0–200)
HDL: 62 mg/dL (ref >40)
LDL CALC: 87 mg/dL (ref 0–99)
Total CHOL/HDL Ratio: 2.7 RATIO
Triglycerides: 81 mg/dL (ref <150)
VLDL: 16 mg/dL (ref 0–40)

## 2016-02-08 LAB — CBC
HEMATOCRIT: 39.8 % — AB (ref 40.0–52.0)
Hemoglobin: 13.7 g/dL (ref 13.0–18.0)
MCH: 30.5 pg (ref 26.0–34.0)
MCHC: 34.3 g/dL (ref 32.0–36.0)
MCV: 88.9 fL (ref 80.0–100.0)
PLATELETS: 270 10*3/uL (ref 150–440)
RBC: 4.48 MIL/uL (ref 4.40–5.90)
RDW: 13.1 % (ref 11.5–14.5)
WBC: 6.9 10*3/uL (ref 3.8–10.6)

## 2016-02-08 LAB — OSMOLALITY, URINE: OSMOLALITY UR: 362 mosm/kg (ref 300–900)

## 2016-02-08 MED ORDER — ASPIRIN 81 MG PO TBEC: 81.0000 mg | DELAYED_RELEASE_TABLET | Freq: Every day | ORAL | 5 refills | Status: DC

## 2016-02-08 MED ORDER — ATORVASTATIN CALCIUM 20 MG PO TABS
40.0000 mg | ORAL_TABLET | Freq: Every day | ORAL | Status: DC
  Administered 2016-02-08: 40 mg via ORAL
  Filled 2016-02-08: qty 2
  Filled 2016-02-08: qty 1

## 2016-02-08 MED ORDER — DOCUSATE SODIUM 100 MG PO CAPS: 100.0000 mg | ORAL_CAPSULE | Freq: Two times a day (BID) | ORAL | 0 refills | Status: DC

## 2016-02-08 MED ORDER — CLOPIDOGREL BISULFATE 75 MG PO TABS
75.0000 mg | ORAL_TABLET | Freq: Every day | ORAL | Status: DC
  Administered 2016-02-08 – 2016-02-09 (×2): 75 mg via ORAL
  Filled 2016-02-08 (×2): qty 1

## 2016-02-08 MED ORDER — HYDRALAZINE HCL 20 MG/ML IJ SOLN: 10.0000 mg | INTRAMUSCULAR | Status: DC | PRN

## 2016-02-08 MED ORDER — ATORVASTATIN CALCIUM 40 MG PO TABS: 40.0000 mg | ORAL_TABLET | Freq: Every day | ORAL | 5 refills | Status: DC

## 2016-02-08 MED ORDER — LOSARTAN POTASSIUM 50 MG PO TABS: 50.0000 mg | ORAL_TABLET | Freq: Every day | ORAL | 6 refills | Status: DC
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
## 2016-02-08 NOTE — Care Management (Signed)
Admitted to this facility with the diagnosis of left leg weakness. Son Unknown Foley (age 51 ) is in the home. Mother is Assunta Gambles [phone]). Home Health in the past, doesn't remember name of agency. No skilled nursing. No home oxygen. Uses no aids for ambulation. Takes care of all basic activities of daily living himself, doesn't drive. Parents and son helps with errands. No falls. Good appetite. Prescriptions are filled at [facility] in [place].  Physical therapy evaluation completed. Recommends home with home health and physical therapy. Discussed home health agencies. [place]. Floydene Flock, Advanced representative updated. No speech needs Possible discharge to home today per Dr. Ether Griffins. Shelbie Ammons RN MSN CCM Care Management
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
## 2016-02-08 NOTE — Progress Notes (Signed)
SLP Cancellation Note  Patient Details Name: Jared Grimes MRN: [id number] DOB: [birth year]   Cancelled treatment:       Reason Eval/Treat Not Completed: SLP screened, no needs identified, will sign off (reviewed chart notes; consulted NSG then met w/ pt.). Pt and family denied any speech or swallowing deficits at this time; pt denied need for any skilled ST services at this time. Encouraged the pt and family to contact NSG if any needs or questions. NSG to reconsult if any change in status while admitted.  Recommend general aspiration precautions w/ current regular diet. Pt agreed.    Orinda Kenner, MS, CCC-SLP [place] Gaul 02/08/2016, 11:27 AM
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
## 2016-02-09 ENCOUNTER — Observation Stay: Payer: Medicare Other

## 2016-02-09 DIAGNOSIS — G40909 Epilepsy, unspecified, not intractable, without status epilepticus: Secondary | ICD-10-CM

## 2016-02-09 DIAGNOSIS — G459 Transient cerebral ischemic attack, unspecified: Secondary | ICD-10-CM | POA: Diagnosis not present

## 2016-02-09 DIAGNOSIS — R29898 Other symptoms and signs involving the musculoskeletal system: Secondary | ICD-10-CM | POA: Diagnosis not present

## 2016-02-09 LAB — ECHOCARDIOGRAM COMPLETE
CHL CUP MV DEC (S): 222
E decel time: 222 msec
E/e' ratio: 8.58
FS: 42 % (ref 28–44)
HEIGHTINCHES: 67 in
IV/PV OW: 1.05
LA ID, A-P, ES: 40 mm
LA diam index: 2.04 cm/m2
LA vol A4C: 41.9 ml
LAVOL: 60.5 mL
LAVOLIN: 30.9 mL/m2
LEFT ATRIUM END SYS DIAM: 40 mm
LV E/e'average: 8.58
LVEEMED: 8.58
LVELAT: 12 cm/s
MV Peak grad: 4 mmHg
MVPKAVEL: 60.1 m/s
MVPKEVEL: 103 m/s
PW: 10.6 mm — AB (ref 0.6–1.1)
RV LATERAL S' VELOCITY: 14 cm/s
TAPSE: 25.2 mm
TDI e' lateral: 12
TDI e' medial: 9.25
WEIGHTICAEL: 2809.6 [oz_av]

## 2016-02-09 LAB — CARBAMAZEPINE LEVEL, TOTAL: Carbamazepine Lvl: 13.4 ug/mL — ABNORMAL HIGH (ref 4.0–12.0)

## 2016-02-09 LAB — HEMOGLOBIN A1C
Hgb A1c MFr Bld: 5 % (ref 4.8–5.6)
Mean Plasma Glucose: 97 mg/dL

## 2016-02-09 LAB — TSH: TSH: 2.431 u[IU]/mL (ref 0.350–4.500)

## 2016-02-09 LAB — SODIUM: Sodium: 129 mmol/L — ABNORMAL LOW (ref 135–145)

## 2016-02-09 MED ORDER — LOSARTAN POTASSIUM 100 MG PO TABS: 100.0000 mg | ORAL_TABLET | Freq: Every day | ORAL | 6 refills | Status: DC

## 2016-02-09 MED ORDER — CLOPIDOGREL BISULFATE 75 MG PO TABS: 75.0000 mg | ORAL_TABLET | Freq: Every day | ORAL | 6 refills | Status: DC

## 2016-02-09 MED ORDER — LOSARTAN POTASSIUM 50 MG PO TABS
100.0000 mg | ORAL_TABLET | Freq: Every day | ORAL | Status: DC
  Administered 2016-02-09: 11:00:00 100 mg via ORAL
  Filled 2016-02-09: qty 2

## 2016-02-09 NOTE — Progress Notes (Signed)
PT Cancellation Note  Patient Details Name: Jared Grimes MRN: [id number] DOB: [birth year]   Cancelled Treatment:    Reason Eval/Treat Not Completed: Patient at procedure or test/unavailable. Treatment attempted this morn; pt unavailable. Re attempt treatment later today.   Larae Grooms, PTA 02/09/2016, 11:19 AM

## 2016-02-09 NOTE — Care Management (Signed)
Discharge to home today per Dr. Ether Griffins. Will be followed by [place]. Family will transport. Varney Baas Brya Simerly RN MSN CCM Care Management
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
## 2016-02-09 NOTE — Progress Notes (Signed)
Call placed to pt's neurologist Dr. Manuella Ghazi at [facility], made them aware of Dr. Ether Griffins would like for Dr. Manuella Ghazi to order outpt MRI that is safe for pt's brain stimulator ASAP and then have pt follow up with Dr. Manuella Ghazi once MRI results are back.  Per receptionist, message sent to Dr. Manuella Ghazi and they will get back in touch with pt.  Clarise Cruz, RN

## 2016-02-09 NOTE — Progress Notes (Signed)
Discussed discharge instructions and medications with pt. IV removed. All questions addressed. Pt transported home via car by his mother.

## 2016-02-16 DIAGNOSIS — Z9689 Presence of other specified functional implants: Secondary | ICD-10-CM

## 2016-02-17 NOTE — Progress Notes (Signed)
[facility]  Patient Status: Patient refused [facility] services, Jared Grimes, CM & Marshell Garfinkel, CM made aware.    If patient discharges after hours, please call [phone].   Jared Grimes 02/17/2016, 3:02 PM

## 2016-02-18 ENCOUNTER — Encounter: Payer: Self-pay | Admitting: Emergency Medicine

## 2016-02-18 ENCOUNTER — Emergency Department: Payer: Medicare Other

## 2016-02-18 ENCOUNTER — Observation Stay
Admission: EM | Admit: 2016-02-18 | Discharge: 2016-02-20 | Disposition: A | Discharge status: Home / Self Care | Payer: Medicare Other | Attending: Internal Medicine | Admitting: Internal Medicine

## 2016-02-18 DIAGNOSIS — Z79899 Other long term (current) drug therapy: Secondary | ICD-10-CM | POA: Insufficient documentation

## 2016-02-18 DIAGNOSIS — Z8673 Personal history of transient ischemic attack (TIA), and cerebral infarction without residual deficits: Secondary | ICD-10-CM | POA: Diagnosis not present

## 2016-02-18 DIAGNOSIS — Z7902 Long term (current) use of antithrombotics/antiplatelets: Secondary | ICD-10-CM | POA: Insufficient documentation

## 2016-02-18 DIAGNOSIS — R531 Weakness: Secondary | ICD-10-CM

## 2016-02-18 DIAGNOSIS — E785 Hyperlipidemia, unspecified: Secondary | ICD-10-CM | POA: Diagnosis not present

## 2016-02-18 DIAGNOSIS — E871 Hypo-osmolality and hyponatremia: Secondary | ICD-10-CM | POA: Insufficient documentation

## 2016-02-18 DIAGNOSIS — I10 Essential (primary) hypertension: Secondary | ICD-10-CM | POA: Diagnosis not present

## 2016-02-18 DIAGNOSIS — R112 Nausea with vomiting, unspecified: Secondary | ICD-10-CM

## 2016-02-18 DIAGNOSIS — G5690 Unspecified mononeuropathy of unspecified upper limb: Secondary | ICD-10-CM | POA: Diagnosis not present

## 2016-02-18 DIAGNOSIS — Z7982 Long term (current) use of aspirin: Secondary | ICD-10-CM | POA: Insufficient documentation

## 2016-02-18 DIAGNOSIS — B349 Viral infection, unspecified: Principal | ICD-10-CM | POA: Insufficient documentation

## 2016-02-18 DIAGNOSIS — G40909 Epilepsy, unspecified, not intractable, without status epilepticus: Secondary | ICD-10-CM | POA: Diagnosis present

## 2016-02-18 DIAGNOSIS — R892 Abnormal level of other drugs, medicaments and biological substances in specimens from other organs, systems and tissues: Secondary | ICD-10-CM | POA: Diagnosis not present

## 2016-02-18 HISTORY — DX: Cerebral infarction, unspecified: I63.9

## 2016-02-18 LAB — COMPREHENSIVE METABOLIC PANEL
ALT: 35 U/L (ref 17–63)
ANION GAP: 11 (ref 5–15)
AST: 30 U/L (ref 15–41)
Albumin: 4 g/dL (ref 3.5–5.0)
Alkaline Phosphatase: 38 U/L (ref 38–126)
BUN: 14 mg/dL (ref 6–20)
CHLORIDE: 90 mmol/L — AB (ref 101–111)
CO2: 22 mmol/L (ref 22–32)
CREATININE: 0.53 mg/dL — AB (ref 0.61–1.24)
Calcium: 8.3 mg/dL — ABNORMAL LOW (ref 8.9–10.3)
Glucose, Bld: 109 mg/dL — ABNORMAL HIGH (ref 65–99)
POTASSIUM: 4 mmol/L (ref 3.5–5.1)
SODIUM: 123 mmol/L — AB (ref 135–145)
Total Bilirubin: <0.1 mg/dL — ABNORMAL LOW (ref 0.3–1.2)
Total Protein: 6.9 g/dL (ref 6.5–8.1)

## 2016-02-18 LAB — CBC WITH DIFFERENTIAL/PLATELET
Basophils Absolute: 0 10*3/uL (ref 0–0.1)
Basophils Relative: 0 %
EOS ABS: 0 10*3/uL (ref 0–0.7)
EOS PCT: 1 %
HCT: 42.4 % (ref 40.0–52.0)
Hemoglobin: 14.7 g/dL (ref 13.0–18.0)
LYMPHS ABS: 0.5 10*3/uL — AB (ref 1.0–3.6)
LYMPHS PCT: 9 %
MCH: 30.5 pg (ref 26.0–34.0)
MCHC: 34.6 g/dL (ref 32.0–36.0)
MCV: 87.9 fL (ref 80.0–100.0)
MONO ABS: 0.6 10*3/uL (ref 0.2–1.0)
Monocytes Relative: 10 %
Neutro Abs: 4.8 10*3/uL (ref 1.4–6.5)
Neutrophils Relative %: 80 %
PLATELETS: 322 10*3/uL (ref 150–440)
RBC: 4.82 MIL/uL (ref 4.40–5.90)
RDW: 13 % (ref 11.5–14.5)
WBC: 5.9 10*3/uL (ref 3.8–10.6)

## 2016-02-18 LAB — URINALYSIS, ROUTINE W REFLEX MICROSCOPIC
Bilirubin Urine: NEGATIVE
GLUCOSE, UA: NEGATIVE mg/dL
Hgb urine dipstick: NEGATIVE
KETONES UR: 5 mg/dL — AB
LEUKOCYTES UA: NEGATIVE
NITRITE: NEGATIVE
PROTEIN: NEGATIVE mg/dL
Specific Gravity, Urine: 1.012 (ref 1.005–1.030)
pH: 7 (ref 5.0–8.0)

## 2016-02-18 LAB — MAGNESIUM: MAGNESIUM: 2 mg/dL (ref 1.7–2.4)

## 2016-02-18 LAB — CARBAMAZEPINE LEVEL, TOTAL: CARBAMAZEPINE LVL: 15.8 ug/mL — AB (ref 4.0–12.0)

## 2016-02-18 LAB — INFLUENZA PANEL BY PCR (TYPE A & B)
INFLBPCR: NEGATIVE
Influenza A By PCR: NEGATIVE

## 2016-02-18 MED ORDER — SODIUM CHLORIDE 0.9 % IV SOLN
INTRAVENOUS | Status: DC
  Administered 2016-02-18 – 2016-02-19 (×3): via INTRAVENOUS

## 2016-02-18 MED ORDER — ACETAMINOPHEN 325 MG PO TABS
650.0000 mg | ORAL_TABLET | Freq: Four times a day (QID) | ORAL | Status: DC | PRN
  Administered 2016-02-19: 08:00:00 650 mg via ORAL
  Filled 2016-02-18: qty 2

## 2016-02-18 MED ORDER — CALCIUM CARBONATE-VITAMIN D 500-200 MG-UNIT PO TABS
1.0000 | ORAL_TABLET | Freq: Two times a day (BID) | ORAL | Status: DC
  Administered 2016-02-18 – 2016-02-20 (×4): 1 via ORAL
  Filled 2016-02-18 (×4): qty 1

## 2016-02-18 MED ORDER — GUAIFENESIN-DM 100-10 MG/5ML PO SYRP
5.0000 mL | ORAL_SOLUTION | ORAL | Status: DC | PRN
  Administered 2016-02-19 – 2016-02-20 (×3): 5 mL via ORAL
  Filled 2016-02-18 (×3): qty 5

## 2016-02-18 MED ORDER — ATORVASTATIN CALCIUM 20 MG PO TABS
40.0000 mg | ORAL_TABLET | Freq: Every day | ORAL | Status: DC
  Administered 2016-02-18 – 2016-02-19 (×2): 40 mg via ORAL
  Filled 2016-02-18 (×2): qty 2

## 2016-02-18 MED ORDER — ONDANSETRON HCL 4 MG PO TABS: 4.0000 mg | ORAL_TABLET | Freq: Four times a day (QID) | ORAL | Status: DC | PRN

## 2016-02-18 MED ORDER — DOCUSATE SODIUM 100 MG PO CAPS
100.0000 mg | ORAL_CAPSULE | Freq: Two times a day (BID) | ORAL | Status: DC
  Administered 2016-02-19 – 2016-02-20 (×2): 100 mg via ORAL
  Filled 2016-02-18 (×2): qty 1

## 2016-02-18 MED ORDER — ENOXAPARIN SODIUM 40 MG/0.4ML ~~LOC~~ SOLN
40.0000 mg | SUBCUTANEOUS | Status: DC
  Administered 2016-02-18 – 2016-02-19 (×2): 40 mg via SUBCUTANEOUS
  Filled 2016-02-18 (×2): qty 0.4

## 2016-02-18 MED ORDER — CLOPIDOGREL BISULFATE 75 MG PO TABS
75.0000 mg | ORAL_TABLET | Freq: Every day | ORAL | Status: DC
  Administered 2016-02-19 – 2016-02-20 (×2): 75 mg via ORAL
  Filled 2016-02-18 (×2): qty 1

## 2016-02-18 MED ORDER — LAMOTRIGINE 100 MG PO TABS
200.0000 mg | ORAL_TABLET | Freq: Two times a day (BID) | ORAL | Status: DC
  Administered 2016-02-18 – 2016-02-20 (×4): 200 mg via ORAL
  Filled 2016-02-18 (×4): qty 2

## 2016-02-18 MED ORDER — GUAIFENESIN 100 MG/5ML PO SOLN
15.0000 mL | ORAL | Status: DC | PRN
  Administered 2016-02-19 (×3): 300 mg via ORAL
  Filled 2016-02-18 (×3): qty 20
  Filled 2016-02-18: qty 15

## 2016-02-18 MED ORDER — ASPIRIN EC 81 MG PO TBEC
81.0000 mg | DELAYED_RELEASE_TABLET | Freq: Every day | ORAL | Status: DC
  Administered 2016-02-19 – 2016-02-20 (×2): 81 mg via ORAL
  Filled 2016-02-18 (×2): qty 1

## 2016-02-18 MED ORDER — ACETAMINOPHEN 650 MG RE SUPP: 650.0000 mg | Freq: Four times a day (QID) | RECTAL | Status: DC | PRN

## 2016-02-18 MED ORDER — ONDANSETRON HCL 4 MG/2ML IJ SOLN: 4.0000 mg | Freq: Four times a day (QID) | INTRAMUSCULAR | Status: DC | PRN

## 2016-02-18 MED ORDER — LOSARTAN POTASSIUM 50 MG PO TABS
100.0000 mg | ORAL_TABLET | Freq: Every day | ORAL | Status: DC
  Administered 2016-02-19 – 2016-02-20 (×2): 100 mg via ORAL
  Filled 2016-02-18 (×2): qty 2

## 2016-02-18 MED ORDER — SODIUM CHLORIDE 0.9 % IV BOLUS (SEPSIS)
500.0000 mL | INTRAVENOUS | Status: AC
  Administered 2016-02-18: 500 mL via INTRAVENOUS

## 2016-02-18 NOTE — ED Notes (Signed)
Patient is unable to void at this time. Patient did stand at the bedside and try.

## 2016-02-18 NOTE — H&P (Signed)
[facility] [place] NAME: Jared Grimes    MR#:  [id number]  DATE OF BIRTH:  1965-11-08  DATE OF ADMISSION:  02/18/2016  PRIMARY CARE PHYSICIAN: Juluis Pitch, MD   REQUESTING/REFERRING PHYSICIAN: Karma Greaser MD  CHIEF COMPLAINT:   Chief Complaint  Patient presents with  . Emesis  . Headache  . Fever    HISTORY OF PRESENT ILLNESS: Jared Grimes  is a 50 y.o. male with a known history of Hypertension, seizure possible recent CVA has a vagal nerve stimulator in place who is presenting with complaining of having a headache fever and emesis for the past few days. He felt that he had flulike symptoms. Patient has chronic hyponatremia. Sodium is noted to be lower than baseline. And has he also has a history of seizure disorder . To note patient was recently hospitalized for possible CVA due to left leg weakness however due to his vagal nerve stimulator he was unable to have an MRI.  He denies any fevers chills no chest pain or shortness of breath.    AST MEDICAL HISTORY:   Past Medical History:  Diagnosis Date  . Hypertension   . Seizures ([place])   . Stroke [facility])     PAST SURGICAL HISTORY: Past Surgical History:  Procedure Laterality Date  . COLONOSCOPY WITH PROPOFOL N/A 10/22/2015   Procedure: COLONOSCOPY WITH PROPOFOL;  Surgeon: Lollie Sails, MD;  Location: [facility] ENDOSCOPY;  Service: Endoscopy;  Laterality: N/A;  . NERVE SURGERY    . VAGUS NERVE STIMULATOR INSERTION N/A 02/03/2015   Procedure: VAGAL NERVE STIMULATOR IMPLANT LEFT SIDED;  Surgeon: Consuella Lose, MD;  Location: [facility] NEURO ORS;  Service: Neurosurgery;  Laterality: N/A;    SOCIAL HISTORY:  Social History  Substance Use Topics  . Smoking status: Never Smoker  . Smokeless tobacco: Never Used  . Alcohol use No    FAMILY HISTORY: No family history on file.  DRUG ALLERGIES: No Known Allergies  REVIEW OF SYSTEMS:   CONSTITUTIONAL:  +fever, fatigue or  + weakness.  EYES:  No blurred or double vision.  EARS, NOSE, AND THROAT: No tinnitus or ear pain. + congestion RESPIRATORY: No cough, shortness of breath, wheezing or hemoptysis.  CARDIOVASCULAR: No chest pain, orthopnea, edema.  GASTROINTESTINAL: No nausea, vomiting, diarrhea or abdominal pain.  GENITOURINARY: No dysuria, hematuria.  ENDOCRINE: No polyuria, nocturia,  HEMATOLOGY: No anemia, easy bruising or bleeding SKIN: No rash or lesion. MUSCULOSKELETAL: No joint pain or arthritis.   NEUROLOGIC: No tingling, numbness, weakness.  PSYCHIATRY: No anxiety or depression.   MEDICATIONS AT HOME:  Prior to Admission medications   Medication Sig Start Date End Date Taking? Authorizing Provider  aspirin EC 81 MG EC tablet Take 1 tablet (81 mg total) by mouth daily. 02/09/16   Theodoro Grist, MD  atorvastatin (LIPITOR) 40 MG tablet Take 1 tablet (40 mg total) by mouth at bedtime. 02/08/16   Theodoro Grist, MD  Calcium Carbonate-Vitamin D (CALCIUM 600+D) 600-200 MG-UNIT TABS Take 1 tablet by mouth 2 (two) times daily.    Historical Provider, MD  carbamazepine (TEGRETOL XR) 200 MG 12 hr tablet Take 600-800 mg by mouth 2 (two) times daily. 800 mg every morning and 600 mg at bedtime    Historical Provider, MD  clopidogrel (PLAVIX) 75 MG tablet Take 1 tablet (75 mg total) by mouth daily. 02/10/16   Theodoro Grist, MD  docusate sodium (COLACE) 100 MG capsule Take 1 capsule (100 mg total) by mouth 2 (two)  times daily. 02/08/16   Theodoro Grist, MD  lamoTRIgine (LAMICTAL) 200 MG tablet Take 200 mg by mouth 2 (two) times daily.    Historical Provider, MD  losartan (COZAAR) 100 MG tablet Take 1 tablet (100 mg total) by mouth daily. 02/10/16   Theodoro Grist, MD      PHYSICAL EXAMINATION:   VITAL SIGNS: Blood pressure (!) 155/92, pulse 70, temperature 97.6 F (36.4 C), temperature source Oral, resp. rate 18, height 5\' 7"  (1.702 m), weight 171 lb (77.6 kg), SpO2 98 %.  GENERAL:  50 y.o.-year-old patient lying in the bed with  no acute distress.  EYES: Pupils equal, round, reactive to light and accommodation. No scleral icterus. Extraocular muscles intact.  HEENT: Head atraumatic, normocephalic. Oropharynx and nasopharynx clear.  NECK:  Supple, no jugular venous distention. No thyroid enlargement, no tenderness.  LUNGS: Normal breath sounds bilaterally, no wheezing, rales,rhonchi or crepitation. No use of accessory muscles of respiration.  CARDIOVASCULAR: S1, S2 normal. No murmurs, rubs, or gallops.  ABDOMEN: Soft, nontender, nondistended. Bowel sounds present. No organomegaly or mass.  EXTREMITIES: No pedal edema, cyanosis, or clubbing.  NEUROLOGIC: Cranial nerves II through XII are intact. Left upper extremity paralyzed chronic left lower extremity with the mild weakness. Sensation intact. Gait not checked.  PSYCHIATRIC: The patient is alert and oriented x 3.  SKIN: No obvious rash, lesion, or ulcer.   LABORATORY PANEL:   CBC  Recent Labs Lab 02/18/16 1440  WBC 5.9  HGB 14.7  HCT 42.4  PLT 322  MCV 87.9  MCH 30.5  MCHC 34.6  RDW 13.0  LYMPHSABS 0.5*  MONOABS 0.6  EOSABS 0.0  BASOSABS 0.0   ------------------------------------------------------------------------------------------------------------------  Chemistries   Recent Labs Lab 02/18/16 1440  NA 123*  K 4.0  CL 90*  CO2 22  GLUCOSE 109*  BUN 14  CREATININE 0.53*  CALCIUM 8.3*  AST 30  ALT 35  ALKPHOS 38  BILITOT <0.1*   ------------------------------------------------------------------------------------------------------------------ estimated creatinine clearance is 102.1 mL/min (by C-G formula based on SCr of 0.53 mg/dL (L)). ------------------------------------------------------------------------------------------------------------------ No results for input(s): TSH, T4TOTAL, T3FREE, THYROIDAB in the last 72 hours.  Invalid input(s): FREET3   Coagulation profile No results for input(s): INR, PROTIME in the last 168  hours. ------------------------------------------------------------------------------------------------------------------- No results for input(s): DDIMER in the last 72 hours. -------------------------------------------------------------------------------------------------------------------  Cardiac Enzymes No results for input(s): CKMB, TROPONINI, MYOGLOBIN in the last 168 hours.  Invalid input(s): CK ------------------------------------------------------------------------------------------------------------------ Invalid input(s): POCBNP  ---------------------------------------------------------------------------------------------------------------  Urinalysis    Component Value Date/Time   COLORURINE YELLOW (A) 02/07/2016 1048   APPEARANCEUR HAZY (A) 02/07/2016 1048   LABSPEC 1.013 02/07/2016 1048   PHURINE 8.0 02/07/2016 1048   GLUCOSEU NEGATIVE 02/07/2016 1048   HGBUR NEGATIVE 02/07/2016 1048   BILIRUBINUR NEGATIVE 02/07/2016 1048   KETONESUR NEGATIVE 02/07/2016 1048   PROTEINUR NEGATIVE 02/07/2016 1048   NITRITE NEGATIVE 02/07/2016 1048   LEUKOCYTESUR NEGATIVE 02/07/2016 1048     RADIOLOGY: Dg Chest 2 View  Result Date: 02/18/2016 CLINICAL DATA:  Cough, subjective fevers, flu-like symptoms EXAM: CHEST  2 VIEW COMPARISON:  None. FINDINGS: Lungs are clear.  No pleural effusion or pneumothorax. The heart is normal in size. Stimulator overlying the left chest. Degenerative changes of the visualized thoracolumbar spine. IMPRESSION: No evidence of acute cardiopulmonary disease. Electronically Signed   By: Julian Hy M.D.   On: 02/18/2016 18:05    EKG: Orders placed or performed during the hospital encounter of 02/07/16  . EKG 12-Lead  . EKG 12-Lead  .  EKG    IMPRESSION AND PLAN: Patient is a 50 year old presenting with generalized weakness worsening hyponatremia  1. Hyponatremia during recent evaluation he was thought to have possible SIADH however he has had  nausea and vomiting so will hydrate him with the normal saline and follow his sodium level  2. Fever headache flow will be ruled out supportive therapy  3. History of seizure disorder continue his carbamazepine and Lamictal  4. Recent TIA/CVA continue therapy with Plavix  5. Hyperlipidemia unspecified continue simvastatin  6. Essential hypertension continue losartan All the records are reviewed and case discussed with ED provider. Management plans discussed with the patient, family and they are in agreement.  CODE STATUS: Code Status History    Date Active Date Inactive Code Status Order ID Comments User Context   02/07/2016  2:09 PM 02/09/2016  7:43 PM Full Code [id number]  Idelle Crouch, MD Inpatient       TOTAL TIME TAKING CARE OF THIS PATIENT: 55 minutes.    Dustin Flock M.D on 02/18/2016 at 6:37 PM  Between 7am to 6pm - Pager - [phone]  After 6pm go to [email or web address] - password [email or web address] [facility]  Office  [phone]  CC: Primary care physician; Juluis Pitch, MD
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
## 2016-02-18 NOTE — ED Triage Notes (Addendum)
Patient to ER for c/o flu-like symptoms. Patient reports having headache, N/V, fever since yesterday. Denies any abdominal pain. Patient appears fatigued. Patient was admitted earlier this month for CVA.

## 2016-02-18 NOTE — Progress Notes (Signed)
Tegretol level little high I will hold his meds for now

## 2016-02-19 DIAGNOSIS — B349 Viral infection, unspecified: Secondary | ICD-10-CM | POA: Diagnosis not present

## 2016-02-19 LAB — CBC
HCT: 42.1 % (ref 40.0–52.0)
HEMOGLOBIN: 14.6 g/dL (ref 13.0–18.0)
MCH: 30.1 pg (ref 26.0–34.0)
MCHC: 34.6 g/dL (ref 32.0–36.0)
MCV: 87.1 fL (ref 80.0–100.0)
Platelets: 310 10*3/uL (ref 150–440)
RBC: 4.84 MIL/uL (ref 4.40–5.90)
RDW: 12.7 % (ref 11.5–14.5)
WBC: 4.8 10*3/uL (ref 3.8–10.6)

## 2016-02-19 LAB — BASIC METABOLIC PANEL
ANION GAP: 7 (ref 5–15)
BUN: 10 mg/dL (ref 6–20)
CHLORIDE: 95 mmol/L — AB (ref 101–111)
CO2: 25 mmol/L (ref 22–32)
CREATININE: 0.63 mg/dL (ref 0.61–1.24)
Calcium: 8.5 mg/dL — ABNORMAL LOW (ref 8.9–10.3)
GFR calc non Af Amer: >60 mL/min (ref >60)
Glucose, Bld: 95 mg/dL (ref 65–99)
POTASSIUM: 4 mmol/L (ref 3.5–5.1)
SODIUM: 127 mmol/L — AB (ref 135–145)

## 2016-02-19 LAB — CARBAMAZEPINE LEVEL, TOTAL: CARBAMAZEPINE LVL: 9.8 ug/mL (ref 4.0–12.0)

## 2016-02-19 MED ORDER — CARBAMAZEPINE ER 200 MG PO TB12
600.0000 mg | ORAL_TABLET | Freq: Two times a day (BID) | ORAL | Status: DC
  Administered 2016-02-19 – 2016-02-20 (×2): 600 mg via ORAL
  Filled 2016-02-19 (×2): qty 3

## 2016-02-19 MED ORDER — ZOLPIDEM TARTRATE 5 MG PO TABS
5.0000 mg | ORAL_TABLET | Freq: Once | ORAL | Status: AC
  Administered 2016-02-19: 22:00:00 5 mg via ORAL
  Filled 2016-02-19: qty 1

## 2016-02-19 MED ORDER — HYDROCOD POLST-CPM POLST ER 10-8 MG/5ML PO SUER
5.0000 mL | Freq: Once | ORAL | Status: AC
  Administered 2016-02-20: 5 mL via ORAL
  Filled 2016-02-19: qty 5

## 2016-02-19 MED ORDER — BENZONATATE 100 MG PO CAPS
100.0000 mg | ORAL_CAPSULE | Freq: Three times a day (TID) | ORAL | Status: DC | PRN
  Administered 2016-02-19: 100 mg via ORAL
  Filled 2016-02-19: qty 1

## 2016-02-19 NOTE — Progress Notes (Signed)
Jared Grimes at [place] NAME: Jared Grimes    MR#:  [id number]  DATE OF BIRTH:  12/27/1965  SUBJECTIVE:  Patient reports feeling much better today. He is able to eat. Ambulating himself in the room. Family at bedside.  REVIEW OF SYSTEMS:   Review of Systems  Constitutional: Negative for chills, fever and weight loss.  HENT: Negative for ear discharge, ear pain and nosebleeds.   Eyes: Negative for blurred vision, pain and discharge.  Respiratory: Negative for sputum production, shortness of breath, wheezing and stridor.   Cardiovascular: Negative for chest pain, palpitations, orthopnea and PND.  Gastrointestinal: Negative for abdominal pain, diarrhea, nausea and vomiting.  Genitourinary: Negative for frequency and urgency.  Musculoskeletal: Negative for back pain and joint pain.  Neurological: Positive for weakness. Negative for sensory change, speech change and focal weakness.  Psychiatric/Behavioral: Negative for depression and hallucinations. The patient is not nervous/anxious.    Tolerating Diet: Yes Tolerating PT: Ambulates by himself  DRUG ALLERGIES:  No Known Allergies  VITALS:  Blood pressure (!) 144/80, pulse 70, temperature 97.6 F (36.4 C), temperature source Oral, resp. rate 20, height 5\' 7"  (1.702 m), weight 78.3 kg (172 lb 9.6 oz), SpO2 97 %.  PHYSICAL EXAMINATION:   Physical Exam  GENERAL:  50 y.o.-year-old patient lying in the bed with no acute distress.  EYES: Pupils equal, round, reactive to light and accommodation. No scleral icterus. Extraocular muscles intact.  HEENT: Head atraumatic, normocephalic. Oropharynx and nasopharynx clear.  NECK:  Supple, no jugular venous distention. No thyroid enlargement, no tenderness.  LUNGS: Normal breath sounds bilaterally, no wheezing, rales, rhonchi. No use of accessory muscles of respiration.  CARDIOVASCULAR: S1, S2 normal. No murmurs, rubs, or gallops.  ABDOMEN: Soft,  nontender, nondistended. Bowel sounds present. No organomegaly or mass.  EXTREMITIES: No cyanosis, clubbing or edema b/l.    NEUROLOGIC: Cranial nerves II through XII are intact. No focal Motor or sensory deficits b/l.   PSYCHIATRIC:  patient is alert and oriented x 3.  SKIN: No obvious rash, lesion, or ulcer.   LABORATORY PANEL:  CBC  Recent Labs Lab 02/19/16 0446  WBC 4.8  HGB 14.6  HCT 42.1  PLT 310    Chemistries   Recent Labs Lab 02/18/16 1440 02/18/16 1802 02/19/16 0446  NA 123*  --  127*  K 4.0  --  4.0  CL 90*  --  95*  CO2 22  --  25  GLUCOSE 109*  --  95  BUN 14  --  10  CREATININE 0.53*  --  0.63  CALCIUM 8.3*  --  8.5*  MG  --  2.0  --   AST 30  --   --   ALT 35  --   --   ALKPHOS 38  --   --   BILITOT <0.1*  --   --    Cardiac Enzymes No results for input(s): TROPONINI in the last 168 hours. RADIOLOGY:  Dg Chest 2 View  Result Date: 02/18/2016 CLINICAL DATA:  Cough, subjective fevers, flu-like symptoms EXAM: CHEST  2 VIEW COMPARISON:  None. FINDINGS: Lungs are clear.  No pleural effusion or pneumothorax. The heart is normal in size. Stimulator overlying the left chest. Degenerative changes of the visualized thoracolumbar spine. IMPRESSION: No evidence of acute cardiopulmonary disease. Electronically Signed   By: Julian Hy M.D.   On: 02/18/2016 18:05   ASSESSMENT AND PLAN:  50 year old presenting with generalized weakness worsening  hyponatremia  1. Hyponatremia during recent evaluation he was thought to have possible SIADH however he has had nausea and vomiting so will hydrate him with the normal saline and follow his sodium level Came in with sodium of 123--- 127 -Feels much better  2. Fever headache flow will be ruled out supportive therapy -Resolved 3. History of seizure disorder continue his carbamazepine and Lamictal -Carbamazepine to elevated levels. We'll decrease dose to 600 twice a day -Level is now therapeutic  4. Recent  TIA/CVA continue therapy with Plavix  5. Hyperlipidemia unspecified continue simvastatin  6. Essential hypertension continue losartan  Overall much improved. We'll continue IV hydration for 1 more day.  Case discussed with Care Management/Social Worker. Management plans discussed with the patient, family and they are in agreement.  CODE STATUS: full  DVT Prophylaxis: lovenox TOTAL TIME TAKING CARE OF THIS PATIENT: 30 minutes.  >50% time spent on counselling and coordination of care  POSSIBLE D/C IN 1 DAYS, DEPENDING ON CLINICAL CONDITION.  Note: This dictation was prepared with Dragon dictation along with smaller phrase technology. Any transcriptional errors that result from this process are unintentional.  Elison Worrel M.D on 02/19/2016 at 3:40 PM  Between 7am to 6pm - Pager - [phone]  After 6pm go to [email or web address] - password [email or web address] [facility] [facility]  Office  [phone]  CC: Primary care physician; Juluis Pitch, MD
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
## 2016-02-19 NOTE — Care Management Obs Status (Signed)
[facility] NOTIFICATION   Patient Details  Name: Jared Grimes MRN: [id number] Date of Birth: 11/09/1965   Medicare Observation Status Notification Given:  Yes    Shelbie Ammons, RN 02/19/2016, 2:21 PM

## 2016-02-19 NOTE — Care Management (Signed)
Admitted to this facility with the diagnosis of weakness under observation status. Mother is Assunta Gambles [phone]). Son lives Mr. Rajaram. Prescriptions are filled at [facility]. Takes care of all basic activities of daily living himself. Last seen Dr. Lovie Macadamia 2 months ago.  No needs identified at this time. Shelbie Ammons RN MSN CCM Care Management

## 2016-02-19 NOTE — Progress Notes (Signed)
PT Cancellation Note  Patient Details Name: Jared Grimes MRN: [id number] DOB: May 01, 1965   Cancelled Treatment:    Reason Eval/Treat Not Completed: Patient declined, no reason specified.  Pt refused to work with PT for the second time today.  Will attempt to see pt again tomorrow.   Collie Siad PT, DPT 02/19/2016, 1:55 PM

## 2016-02-20 DIAGNOSIS — B349 Viral infection, unspecified: Secondary | ICD-10-CM | POA: Diagnosis not present

## 2016-02-20 MED ORDER — GUAIFENESIN-DM 100-10 MG/5ML PO SYRP: 5.0000 mL | ORAL_SOLUTION | ORAL | 0 refills | Status: DC | PRN

## 2016-02-20 MED ORDER — CARBAMAZEPINE ER 200 MG PO TB12: 600.0000 mg | ORAL_TABLET | Freq: Two times a day (BID) | ORAL | 0 refills | Status: AC

## 2016-02-20 NOTE — Progress Notes (Signed)
PT Cancellation Note  Patient Details Name: Jared Grimes MRN: [id number] DOB: 10-15-1965   Cancelled Treatment:    Reason Eval/Treat Not Completed:  (Pt has already discharged from PT).  Has discharged from building before PT came to see him.   Ramond Dial 02/20/2016, 11:07 AM    Mee Hives, PT MS Acute Rehab Dept. Number: [place] [facility] [place]

## 2016-02-20 NOTE — Progress Notes (Signed)
Patient discharged home per MD order. All discharge instructions given and all questions answered. 

## 2016-02-20 NOTE — Discharge Summary (Signed)
[facility] [place] [place] NAME: Jared Grimes    MR#:  [id number]  DATE OF BIRTH:  02-Aug-1965  DATE OF ADMISSION:  02/18/2016 ADMITTING PHYSICIAN: Dustin Flock, MD  DATE OF DISCHARGE: 02/20/16  PRIMARY CARE PHYSICIAN: Juluis Pitch, MD    ADMISSION DIAGNOSIS:  Hyponatremia [E87.1] Viral syndrome [B34.9] Seizure disorder (HCC) [G40.909] Generalized weakness [R53.1] Non-intractable vomiting with nausea, unspecified vomiting type [R11.2]  DISCHARGE DIAGNOSIS:  Viral syndrome -acute hyponatremia-improved due to clinical dehydraiton Elevated tegretol levels -now stable SECONDARY DIAGNOSIS:   Past Medical History:  Diagnosis Date  . Hypertension   . Seizures ([place])   . Stroke [facility])     HOSPITAL COURSE:   50 year old presenting with generalized weakness worsening hyponatremia  1. Hyponatremia during recent evaluation he was thought to have possible SIADH however he has had nausea and vomiting so will hydrate him with the normal saline and follow his sodium level Came in with sodium of 123--- 127 -Feels much better  2. Viral syndrome -improved -came in with Fever and headache  -cont supportive therapy  3. History of seizure disorder continue his carbamazepine and Lamictal -Carbamazepine to elevated levels. We'll decrease dose to 600 twice a day -Level is now therapeutic  4. Recent TIA/CVA continue therapy with Plavix  5. Hyperlipidemia unspecified continue simvastatin  6. Essential hypertension continue losartan  Overall stable for d/c  CONSULTS OBTAINED:    DRUG ALLERGIES:  No Known Allergies  DISCHARGE MEDICATIONS:   Current Discharge Medication List    START taking these medications   Details  guaiFENesin-dextromethorphan (ROBITUSSIN DM) 100-10 MG/5ML syrup Take 5 mLs by mouth every 4 (four) hours as needed for cough (chest congestion). Qty: 118 mL, Refills: 0      CONTINUE these medications  which have CHANGED   Details  carbamazepine (TEGRETOL XR) 200 MG 12 hr tablet Take 3 tablets (600 mg total) by mouth 2 (two) times daily. 800 mg every morning and 600 mg at bedtime Qty: 60 tablet, Refills: 0      CONTINUE these medications which have NOT CHANGED   Details  aspirin EC 81 MG EC tablet Take 1 tablet (81 mg total) by mouth daily. Qty: 30 tablet, Refills: 5    atorvastatin (LIPITOR) 40 MG tablet Take 1 tablet (40 mg total) by mouth at bedtime. Qty: 30 tablet, Refills: 5    Calcium Carbonate-Vitamin D (CALCIUM 600+D) 600-200 MG-UNIT TABS Take 1 tablet by mouth 2 (two) times daily.    clopidogrel (PLAVIX) 75 MG tablet Take 1 tablet (75 mg total) by mouth daily. Qty: 30 tablet, Refills: 6    lamoTRIgine (LAMICTAL) 200 MG tablet Take 200 mg by mouth 2 (two) times daily.    losartan (COZAAR) 100 MG tablet Take 1 tablet (100 mg total) by mouth daily. Qty: 30 tablet, Refills: 6      STOP taking these medications     docusate sodium (COLACE) 100 MG capsule         If you experience worsening of your admission symptoms, develop shortness of breath, life threatening emergency, suicidal or homicidal thoughts you must seek medical attention immediately by calling 911 or calling your MD immediately  if symptoms less severe.  You Must read complete instructions/literature along with all the possible adverse reactions/side effects for all the Medicines you take and that have been prescribed to you. Take any new Medicines after you have completely understood and accept all the possible adverse reactions/side effects.  Please note  You were cared for by a hospitalist during your hospital stay. If you have any questions about your discharge medications or the care you received while you were in the hospital after you are discharged, you can call the unit and asked to speak with the hospitalist on call if the hospitalist that took care of you is not available. Once you are discharged,  your primary care physician will handle any further medical issues. Please note that NO REFILLS for any discharge medications will be authorized once you are discharged, as it is imperative that you return to your primary care physician (or establish a relationship with a primary care physician if you do not have one) for your aftercare needs so that they can reassess your need for medications and monitor your lab values. Today   SUBJECTIVE   Doing ok  Some dry cough VITAL SIGNS:  Blood pressure (!) 154/81, pulse 68, temperature 98 F (36.7 C), temperature source Oral, resp. rate 20, height 5\' 7"  (1.702 m), weight 78.3 kg (172 lb 9.6 oz), SpO2 99 %.  I/O:   Intake/Output Summary (Last 24 hours) at 02/20/16 0853 Last data filed at 02/20/16 0600  Gross per 24 hour  Intake             3420 ml  Output             1350 ml  Net             2070 ml    PHYSICAL EXAMINATION:  GENERAL:  50 y.o.-year-old patient lying in the bed with no acute distress.  EYES: Pupils equal, round, reactive to light and accommodation. No scleral icterus. Extraocular muscles intact.  HEENT: Head atraumatic, normocephalic. Oropharynx and nasopharynx clear.  NECK:  Supple, no jugular venous distention. No thyroid enlargement, no tenderness.  LUNGS: Normal breath sounds bilaterally, no wheezing, rales,rhonchi or crepitation. No use of accessory muscles of respiration.  CARDIOVASCULAR: S1, S2 normal. No murmurs, rubs, or gallops.  ABDOMEN: Soft, non-tender, non-distended. Bowel sounds present. No organomegaly or mass.  EXTREMITIES: No pedal edema, cyanosis, or clubbing.  NEUROLOGIC: Cranial nerves II through XII are intact. Muscle strength 5/5 in all extremities. Sensation intact. Gait not checked.  PSYCHIATRIC: The patient is alert and oriented x 3.  SKIN: No obvious rash, lesion, or ulcer.   DATA REVIEW:   CBC   Recent Labs Lab 02/19/16 0446  WBC 4.8  HGB 14.6  HCT 42.1  PLT 310    Chemistries    Recent Labs Lab 02/18/16 1440 02/18/16 1802 02/19/16 0446  NA 123*  --  127*  K 4.0  --  4.0  CL 90*  --  95*  CO2 22  --  25  GLUCOSE 109*  --  95  BUN 14  --  10  CREATININE 0.53*  --  0.63  CALCIUM 8.3*  --  8.5*  MG  --  2.0  --   AST 30  --   --   ALT 35  --   --   ALKPHOS 38  --   --   BILITOT <0.1*  --   --     Microbiology Results   No results found for this or any previous visit (from the past 240 hour(s)).  RADIOLOGY:  Dg Chest 2 View  Result Date: 02/18/2016 CLINICAL DATA:  Cough, subjective fevers, flu-like symptoms EXAM: CHEST  2 VIEW COMPARISON:  None. FINDINGS: Lungs are clear.  No pleural effusion or  pneumothorax. The heart is normal in size. Stimulator overlying the left chest. Degenerative changes of the visualized thoracolumbar spine. IMPRESSION: No evidence of acute cardiopulmonary disease. Electronically Signed   By: Julian Hy M.D.   On: 02/18/2016 18:05     Management plans discussed with the patient, family and they are in agreement.  CODE STATUS:     Code Status Orders        Start     Ordered   02/18/16 2005  Full code  Continuous     02/18/16 2004    Code Status History    Date Active Date Inactive Code Status Order ID Comments User Context   02/07/2016  2:09 PM 02/09/2016  7:43 PM Full Code [id number]  Idelle Crouch, MD Inpatient      TOTAL TIME TAKING CARE OF THIS PATIENT: 40 minutes.    Jared Grimes M.D on 02/20/2016 at 8:53 AM  Between 7am to 6pm - Pager - [phone] After 6pm go to [email or web address] - password [email or web address] [facility]  Office  [phone]  CC: Primary care physician; Juluis Pitch, MD

## 2017-03-18 ENCOUNTER — Emergency Department
Admission: EM | Admit: 2017-03-18 | Discharge: 2017-03-18 | Disposition: A | Discharge status: Home / Self Care | Payer: Medicare Other | Attending: Emergency Medicine | Admitting: Emergency Medicine

## 2017-03-18 ENCOUNTER — Emergency Department: Payer: Medicare Other

## 2017-03-18 DIAGNOSIS — Z7982 Long term (current) use of aspirin: Secondary | ICD-10-CM | POA: Insufficient documentation

## 2017-03-18 DIAGNOSIS — Z8673 Personal history of transient ischemic attack (TIA), and cerebral infarction without residual deficits: Secondary | ICD-10-CM | POA: Diagnosis not present

## 2017-03-18 DIAGNOSIS — I10 Essential (primary) hypertension: Secondary | ICD-10-CM | POA: Insufficient documentation

## 2017-03-18 DIAGNOSIS — S42215A Unspecified nondisplaced fracture of surgical neck of left humerus, initial encounter for closed fracture: Secondary | ICD-10-CM | POA: Diagnosis not present

## 2017-03-18 DIAGNOSIS — Y998 Other external cause status: Secondary | ICD-10-CM | POA: Insufficient documentation

## 2017-03-18 DIAGNOSIS — Y929 Unspecified place or not applicable: Secondary | ICD-10-CM | POA: Insufficient documentation

## 2017-03-18 DIAGNOSIS — Z79899 Other long term (current) drug therapy: Secondary | ICD-10-CM | POA: Insufficient documentation

## 2017-03-18 DIAGNOSIS — Z7902 Long term (current) use of antithrombotics/antiplatelets: Secondary | ICD-10-CM | POA: Insufficient documentation

## 2017-03-18 DIAGNOSIS — G40909 Epilepsy, unspecified, not intractable, without status epilepticus: Secondary | ICD-10-CM | POA: Diagnosis not present

## 2017-03-18 DIAGNOSIS — S4992XA Unspecified injury of left shoulder and upper arm, initial encounter: Secondary | ICD-10-CM | POA: Diagnosis present

## 2017-03-18 DIAGNOSIS — W010XXA Fall on same level from slipping, tripping and stumbling without subsequent striking against object, initial encounter: Secondary | ICD-10-CM | POA: Diagnosis not present

## 2017-03-18 DIAGNOSIS — Y9389 Activity, other specified: Secondary | ICD-10-CM | POA: Insufficient documentation

## 2017-03-18 MED ORDER — OXYCODONE-ACETAMINOPHEN 5-325 MG PO TABS: 1.0000 | ORAL_TABLET | Freq: Four times a day (QID) | ORAL | 0 refills | Status: AC | PRN

## 2017-03-18 MED ORDER — OXYCODONE-ACETAMINOPHEN 5-325 MG PO TABS
1.0000 | ORAL_TABLET | Freq: Once | ORAL | Status: AC
  Administered 2017-03-18: 1 via ORAL
  Filled 2017-03-18: qty 1
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
## 2017-03-18 NOTE — Discharge Instructions (Signed)
Wear the shoulder immobilizer at all times except when showering.  You should follow-up with Dr. Sabra Heck from orthopedics in 5-7 days.  You can take the Percocet prescribed today for pain.  Return to the emergency department immediately for new or worsening pain, pain not controlled by the medicine given, worsening swelling, numbness or weakness, or recurrent seizures.  You should follow-up with Dr. Manuella Ghazi for your seizures.
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
## 2017-04-07 ENCOUNTER — Emergency Department: Payer: Medicare Other

## 2017-04-07 ENCOUNTER — Encounter: Payer: Self-pay | Admitting: Emergency Medicine

## 2017-04-07 ENCOUNTER — Emergency Department
Admission: EM | Admit: 2017-04-07 | Discharge: 2017-04-07 | Disposition: A | Discharge status: Home / Self Care | Payer: Medicare Other | Attending: Emergency Medicine | Admitting: Emergency Medicine

## 2017-04-07 DIAGNOSIS — Y999 Unspecified external cause status: Secondary | ICD-10-CM | POA: Insufficient documentation

## 2017-04-07 DIAGNOSIS — Y929 Unspecified place or not applicable: Secondary | ICD-10-CM | POA: Diagnosis not present

## 2017-04-07 DIAGNOSIS — S42202K Unspecified fracture of upper end of left humerus, subsequent encounter for fracture with nonunion: Secondary | ICD-10-CM | POA: Diagnosis not present

## 2017-04-07 DIAGNOSIS — I10 Essential (primary) hypertension: Secondary | ICD-10-CM | POA: Insufficient documentation

## 2017-04-07 DIAGNOSIS — Z7902 Long term (current) use of antithrombotics/antiplatelets: Secondary | ICD-10-CM | POA: Insufficient documentation

## 2017-04-07 DIAGNOSIS — Z7982 Long term (current) use of aspirin: Secondary | ICD-10-CM | POA: Insufficient documentation

## 2017-04-07 DIAGNOSIS — Y9389 Activity, other specified: Secondary | ICD-10-CM | POA: Diagnosis not present

## 2017-04-07 DIAGNOSIS — S4992XD Unspecified injury of left shoulder and upper arm, subsequent encounter: Secondary | ICD-10-CM | POA: Diagnosis present

## 2017-04-07 MED ORDER — OXYCODONE-ACETAMINOPHEN 5-325 MG PO TABS
2.0000 | ORAL_TABLET | Freq: Once | ORAL | Status: AC
  Administered 2017-04-07: 2 via ORAL
  Filled 2017-04-07: qty 2

## 2017-04-07 MED ORDER — OXYCODONE-ACETAMINOPHEN 5-325 MG PO TABS: 1.0000 | ORAL_TABLET | Freq: Four times a day (QID) | ORAL | 0 refills | Status: DC | PRN
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
## 2017-04-07 NOTE — ED Notes (Signed)
Pt offered a sling for immobilization and a bag of ice but patients reports he does not need either that he keeps his hand in his pocket for comfort.

## 2017-04-07 NOTE — Discharge Instructions (Signed)
As we discussed, your bones have shifted and this needs to be evaluated by Dr. Sabra Heck.
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
## 2017-04-07 NOTE — ED Notes (Signed)
Arm sling applied to left arm with instructions on use given and pt verbalized understanding.

## 2018-11-25 ENCOUNTER — Other Ambulatory Visit: Payer: Self-pay

## 2018-11-25 ENCOUNTER — Encounter: Payer: Self-pay | Admitting: Emergency Medicine

## 2018-11-25 ENCOUNTER — Emergency Department: Payer: Medicare Other

## 2018-11-25 ENCOUNTER — Emergency Department
Admission: EM | Admit: 2018-11-25 | Discharge: 2018-11-25 | Disposition: A | Discharge status: Home / Self Care | Payer: Medicare Other | Attending: Emergency Medicine | Admitting: Emergency Medicine

## 2018-11-25 DIAGNOSIS — I10 Essential (primary) hypertension: Secondary | ICD-10-CM | POA: Diagnosis not present

## 2018-11-25 DIAGNOSIS — Z8673 Personal history of transient ischemic attack (TIA), and cerebral infarction without residual deficits: Secondary | ICD-10-CM | POA: Insufficient documentation

## 2018-11-25 DIAGNOSIS — W109XXA Fall (on) (from) unspecified stairs and steps, initial encounter: Secondary | ICD-10-CM | POA: Insufficient documentation

## 2018-11-25 DIAGNOSIS — S8992XA Unspecified injury of left lower leg, initial encounter: Secondary | ICD-10-CM | POA: Diagnosis present

## 2018-11-25 DIAGNOSIS — Z79899 Other long term (current) drug therapy: Secondary | ICD-10-CM | POA: Insufficient documentation

## 2018-11-25 DIAGNOSIS — Y929 Unspecified place or not applicable: Secondary | ICD-10-CM | POA: Insufficient documentation

## 2018-11-25 DIAGNOSIS — Z7901 Long term (current) use of anticoagulants: Secondary | ICD-10-CM | POA: Diagnosis not present

## 2018-11-25 DIAGNOSIS — Z7982 Long term (current) use of aspirin: Secondary | ICD-10-CM | POA: Diagnosis not present

## 2018-11-25 DIAGNOSIS — Y9389 Activity, other specified: Secondary | ICD-10-CM | POA: Insufficient documentation

## 2018-11-25 DIAGNOSIS — Y999 Unspecified external cause status: Secondary | ICD-10-CM | POA: Diagnosis not present

## 2018-11-25 DIAGNOSIS — W19XXXA Unspecified fall, initial encounter: Secondary | ICD-10-CM

## 2018-11-25 DIAGNOSIS — M25562 Pain in left knee: Secondary | ICD-10-CM

## 2018-11-25 DIAGNOSIS — S8002XA Contusion of left knee, initial encounter: Secondary | ICD-10-CM

## 2018-11-25 MED ORDER — HYDROCODONE-ACETAMINOPHEN 5-325 MG PO TABS: 1.0000 | ORAL_TABLET | Freq: Four times a day (QID) | ORAL | 0 refills | Status: DC | PRN

## 2018-11-25 MED ORDER — HYDROCODONE-ACETAMINOPHEN 5-325 MG PO TABS
1.0000 | ORAL_TABLET | ORAL | Status: AC
  Administered 2018-11-25: 14:00:00 1 via ORAL
  Filled 2018-11-25: qty 1

## 2018-11-25 NOTE — ED Notes (Signed)
Pt wheeled to truck.

## 2018-11-25 NOTE — Discharge Instructions (Signed)
Please use knee immobilizer as needed for comfort over the next few days.  If no improvement follow-up with orthopedic office.  Please rest ice and elevate the knee.  Take Norco as needed for severe pain.  Return to the ER for any worsening symptoms or urgent changes in your health.

## 2018-11-25 NOTE — ED Provider Notes (Signed)
[place] EMERGENCY DEPARTMENT Provider Note   CSN: [id number] Arrival date & time: 11/25/18  1208     History   Chief Complaint Chief Complaint  Patient presents with  . Knee Pain    HPI Jared Grimes is a 53 y.o. male presents to the emergency department evaluation of left knee pain.  Patient states 5 days ago he fell forward going up steps, landed on his left anterior knee.  Developed some bruising.  He has been ambulatory with no assistive devices, is having pain and swelling to the anterior aspect of the left knee.  Denies any hip pain, ankle pain.  No head injury or any other injury to his body.  His pain is moderate located along the anterior knee.  He is able to straight leg raise.  Denies any back pain numbness tingling radicular symptoms.  No groin pain.  He has not had any medications for pain.    HPI  Past Medical History:  Diagnosis Date  . Hypertension   . Seizures ([place])   . Stroke [facility])     Patient Active Problem List   Diagnosis Date Noted  . Weakness 02/18/2016  . TIA (transient ischemic attack) 02/08/2016  . Hyponatremia 02/08/2016  . Hyperglycemia 02/08/2016  . Left leg weakness 02/07/2016  . HTN, goal below 140/80 02/07/2016  . Ambulatory dysfunction 02/07/2016  . Seizure disorder ([place]) 02/07/2016    Past Surgical History:  Procedure Laterality Date  . COLONOSCOPY WITH PROPOFOL N/A 10/22/2015   Procedure: COLONOSCOPY WITH PROPOFOL;  Surgeon: Lollie Sails, MD;  Location: [facility] ENDOSCOPY;  Service: Endoscopy;  Laterality: N/A;  . NERVE SURGERY    . VAGUS NERVE STIMULATOR INSERTION N/A 02/03/2015   Procedure: VAGAL NERVE STIMULATOR IMPLANT LEFT SIDED;  Surgeon: Consuella Lose, MD;  Location: [place] NEURO ORS;  Service: Neurosurgery;  Laterality: N/A;        Home Medications    Prior to Admission medications   Medication Sig Start Date End Date Taking? Authorizing Provider  aspirin EC 81 MG EC tablet Take 1 tablet (81 mg  total) by mouth daily. 02/09/16   Theodoro Grist, MD  atorvastatin (LIPITOR) 40 MG tablet Take 1 tablet (40 mg total) by mouth at bedtime. 02/08/16   Theodoro Grist, MD  Calcium Carbonate-Vitamin D (CALCIUM 600+D) 600-200 MG-UNIT TABS Take 1 tablet by mouth 2 (two) times daily.    [provider]  carbamazepine (TEGRETOL XR) 200 MG 12 hr tablet Take 3 tablets (600 mg total) by mouth 2 (two) times daily. 800 mg every morning and 600 mg at bedtime 02/20/16   Fritzi Mandes, MD  clopidogrel (PLAVIX) 75 MG tablet Take 1 tablet (75 mg total) by mouth daily. 02/10/16   Theodoro Grist, MD  guaiFENesin-dextromethorphan (ROBITUSSIN DM) 100-10 MG/5ML syrup Take 5 mLs by mouth every 4 (four) hours as needed for cough (chest congestion). 02/20/16   Fritzi Mandes, MD  HYDROcodone-acetaminophen (NORCO) 5-325 MG tablet Take 1 tablet by mouth every 6 (six) hours as needed for moderate pain. 11/25/18   Duanne Guess, PA-C  lamoTRIgine (LAMICTAL) 200 MG tablet Take 200 mg by mouth 2 (two) times daily.    [provider]  losartan (COZAAR) 100 MG tablet Take 1 tablet (100 mg total) by mouth daily. 02/10/16   Theodoro Grist, MD  oxyCODONE-acetaminophen (PERCOCET) 5-325 MG tablet Take 1 tablet by mouth every 6 (six) hours as needed for severe pain. 04/07/17   Lavonia Drafts, MD    Family  History No family history on file.  Social History Social History   Tobacco Use  . Smoking status: Never Smoker  . Smokeless tobacco: Never Used  Substance Use Topics  . Alcohol use: No  . Drug use: No     Allergies   Patient has no known allergies.   Review of Systems Review of Systems  Constitutional: Negative for fever.  Musculoskeletal: Positive for arthralgias and joint swelling. Negative for back pain, gait problem, myalgias and neck pain.  Neurological: Negative for dizziness and headaches.     Physical Exam Updated Vital Signs BP 134/82 (BP Location: Left Arm)   Pulse 86   Temp 98.6 F (37  C) (Oral)   Resp 18   Ht 5\' 7"  (1.702 m)   Wt 87.5 kg   SpO2 100%   BMI 30.23 kg/m   Physical Exam Constitutional:      Appearance: He is well-developed.  HENT:     Head: Normocephalic and atraumatic.  Eyes:     Conjunctiva/sclera: Conjunctivae normal.  Neck:     Musculoskeletal: Normal range of motion.  Cardiovascular:     Rate and Rhythm: Normal rate.  Pulmonary:     Effort: Pulmonary effort is normal. No respiratory distress.  Musculoskeletal:     Comments: Left lower extremity shows ecchymosis along the anterior knee along the tibial tubercle.  Patient is able to actively straight leg raise and full extension.  No swelling or tenderness to the patella.  No quads tendon tenderness or defect.  Tender along the patellar tendon slightly.  Knee is stable to valgus and varus stress testing.  Previous medial knee incision site is intact and healed with no signs of infection.  No swelling or edema throughout the left leg.  Negative Homans sign bilaterally.  Skin:    General: Skin is warm.     Findings: No rash.  Neurological:     Mental Status: He is alert and oriented to person, place, and time.  Psychiatric:        Behavior: Behavior normal.        Thought Content: Thought content normal.      ED Treatments / Results  Labs (all labs ordered are listed, but only abnormal results are displayed) Labs Reviewed - No data to display  EKG None  Radiology Dg Knee Complete 4 Views Left  Result Date: 11/25/2018 CLINICAL DATA:  [place] 5 days ago.  Persistent knee pain. EXAM: LEFT KNEE - COMPLETE 4+ VIEW COMPARISON:  None. FINDINGS: The joint spaces are maintained. No acute fractures identified. No osteochondral lesion. Mild/early degenerative changes. Remote postoperative changes with 2 large bone staples in the proximal tibia. No definite knee joint effusion. IMPRESSION: Mild degenerative changes and remote postoperative changes but no acute bony findings or joint effusion.  Electronically Signed   By: Marijo Sanes M.D.   On: 11/25/2018 14:11    Procedures Procedures (including critical care time)  Medications Ordered in ED Medications  HYDROcodone-acetaminophen (NORCO/VICODIN) 5-325 MG per tablet 1 tablet (1 tablet Oral Given 11/25/18 1354)     Initial Impression / Assessment and Plan / ED Course  I have reviewed the triage vital signs and the nursing notes.  Pertinent labs & imaging results that were available during my care of the patient were reviewed by me and considered in my medical decision making (see chart for details).        53 year old male with left knee pain.  X-rays show no evidence of acute bony abnormality.  Patient able to straight leg raise, no sign of any tendon rupture or ligamentous laxity.  Patient placed into a knee immobilizer.  He will rest ice elevate.  He is given Norco for pain.  He will follow-up with orthopedics if no improvement in the next 3 to 4 days.  Final Clinical Impressions(s) / ED Diagnoses   Final diagnoses:  Contusion of left knee, initial encounter  Fall, initial encounter  Acute pain of left knee    ED Discharge Orders         Ordered    HYDROcodone-acetaminophen (NORCO) 5-325 MG tablet  Every 6 hours PRN     11/25/18 1350           Renata Caprice 11/25/18 1416    Vanessa [place], MD 11/26/18 [phone]

## 2018-11-25 NOTE — ED Notes (Signed)
First Nurse Note: Pt ambulatory into ED c/o left knee pain. Pt is in NAD.

## 2018-11-25 NOTE — ED Notes (Addendum)
See triage note. Pt fell on L knee Tuesday night. Pt can bend knee and straighten leg to certain extent. States he can normally bend it and straighten it a little better than this. Swollen per pt. Denies numbness/tingling in leg/foot. Pt can apply some weight to knee but states not comfortable to apply full weight. Pulse/color/warmth appropriate.

## 2018-11-25 NOTE — ED Triage Notes (Addendum)
Pt arrived via POV with reports of tripping and falling onto left knee, pt states has hx of knee pain. Pt walking with a limp.  Pt states he has been taking aspirin for pain as well as other prescribed pain medication but is unable to recall the name.

## 2019-01-18 ENCOUNTER — Other Ambulatory Visit: Payer: Self-pay | Admitting: Ophthalmology

## 2019-01-18 DIAGNOSIS — H34211 Partial retinal artery occlusion, right eye: Secondary | ICD-10-CM

## 2019-01-28 ENCOUNTER — Ambulatory Visit: Payer: Medicare Other

## 2019-02-14 ENCOUNTER — Other Ambulatory Visit: Payer: Self-pay

## 2019-02-14 ENCOUNTER — Ambulatory Visit
Admission: RE | Admit: 2019-02-14 | Discharge: 2019-02-14 | Disposition: A | Discharge status: Home / Self Care | Payer: Medicare Other | Source: Ambulatory Visit | Attending: Ophthalmology | Admitting: Ophthalmology

## 2019-02-14 DIAGNOSIS — H34211 Partial retinal artery occlusion, right eye: Secondary | ICD-10-CM | POA: Insufficient documentation

## 2019-03-03 ENCOUNTER — Emergency Department
Admission: EM | Admit: 2019-03-03 | Discharge: 2019-03-03 | Disposition: A | Discharge status: Home / Self Care | Payer: Medicare Other | Attending: Emergency Medicine | Admitting: Emergency Medicine

## 2019-03-03 ENCOUNTER — Other Ambulatory Visit: Payer: Self-pay

## 2019-03-03 ENCOUNTER — Encounter: Payer: Self-pay | Admitting: Emergency Medicine

## 2019-03-03 ENCOUNTER — Emergency Department: Payer: Medicare Other

## 2019-03-03 DIAGNOSIS — Z7982 Long term (current) use of aspirin: Secondary | ICD-10-CM | POA: Insufficient documentation

## 2019-03-03 DIAGNOSIS — Z8673 Personal history of transient ischemic attack (TIA), and cerebral infarction without residual deficits: Secondary | ICD-10-CM | POA: Insufficient documentation

## 2019-03-03 DIAGNOSIS — Z79899 Other long term (current) drug therapy: Secondary | ICD-10-CM | POA: Diagnosis not present

## 2019-03-03 DIAGNOSIS — U071 COVID-19: Secondary | ICD-10-CM | POA: Insufficient documentation

## 2019-03-03 DIAGNOSIS — I10 Essential (primary) hypertension: Secondary | ICD-10-CM | POA: Diagnosis not present

## 2019-03-03 DIAGNOSIS — R0789 Other chest pain: Secondary | ICD-10-CM

## 2019-03-03 LAB — BASIC METABOLIC PANEL
Anion gap: 10 (ref 5–15)
BUN: 13 mg/dL (ref 6–20)
CO2: 30 mmol/L (ref 22–32)
Calcium: 8.9 mg/dL (ref 8.9–10.3)
Chloride: 91 mmol/L — ABNORMAL LOW (ref 98–111)
Creatinine, Ser: 0.86 mg/dL (ref 0.61–1.24)
GFR calc Af Amer: >60 mL/min (ref >60)
GFR calc non Af Amer: >60 mL/min (ref >60)
Glucose, Bld: 76 mg/dL (ref 70–99)
Potassium: 4 mmol/L (ref 3.5–5.1)
Sodium: 131 mmol/L — ABNORMAL LOW (ref 135–145)

## 2019-03-03 LAB — CBC
HCT: 43.4 % (ref 39.0–52.0)
Hemoglobin: 14.2 g/dL (ref 13.0–17.0)
MCH: 29.6 pg (ref 26.0–34.0)
MCHC: 32.7 g/dL (ref 30.0–36.0)
MCV: 90.6 fL (ref 80.0–100.0)
Platelets: 279 10*3/uL (ref 150–400)
RBC: 4.79 MIL/uL (ref 4.22–5.81)
RDW: 12.8 % (ref 11.5–15.5)
WBC: 4.8 10*3/uL (ref 4.0–10.5)
nRBC: 0 % (ref 0.0–0.2)

## 2019-03-03 LAB — TROPONIN I (HIGH SENSITIVITY): Troponin I (High Sensitivity): 6 ng/L (ref <18)

## 2019-03-03 NOTE — Discharge Instructions (Addendum)
Return to the ER for new, worsening, or persistent severe chest pain, difficulty breathing, high fever, persistent vomiting, weakness, or any other new or worsening symptoms that concern you.  You may take over-the-counter ibuprofen or Tylenol as needed for the pain.

## 2019-03-03 NOTE — ED Notes (Signed)
Pt verbalized understanding of discharge instructions. NAD at this time. 

## 2019-03-03 NOTE — ED Provider Notes (Signed)
[facility] Emergency Department Provider Note ____________________________________________   First MD Initiated Contact with Patient 03/03/19 0827     (approximate)  I have reviewed the triage vital signs and the nursing notes.   HISTORY  Chief Complaint Chest Pain and Other (COVID +)    HPI Jared Grimes is a 54 y.o. male with PMH as noted below who presents with chest pain, acute onset last night, persistent since then, and described as a soreness.  He states it is substernal and nonradiating.  He states it feels like a sore throat, but in his chest.  He denies any shortness of breath, significant cough, fever, vomiting or diarrhea.  He has no abdominal or back pain.  The patient was diagnosed with COVID-19 on 12/30.  Past Medical History:  Diagnosis Date  . Hypertension   . Seizures ([place])   . Stroke [facility])     Patient Active Problem List   Diagnosis Date Noted  . Weakness 02/18/2016  . TIA (transient ischemic attack) 02/08/2016  . Hyponatremia 02/08/2016  . Hyperglycemia 02/08/2016  . Left leg weakness 02/07/2016  . HTN, goal below 140/80 02/07/2016  . Ambulatory dysfunction 02/07/2016  . Seizure disorder ([place]) 02/07/2016    Past Surgical History:  Procedure Laterality Date  . COLONOSCOPY WITH PROPOFOL N/A 10/22/2015   Procedure: COLONOSCOPY WITH PROPOFOL;  Surgeon: Lollie Sails, MD;  Location: [facility] ENDOSCOPY;  Service: Endoscopy;  Laterality: N/A;  . NERVE SURGERY    . VAGUS NERVE STIMULATOR INSERTION N/A 02/03/2015   Procedure: VAGAL NERVE STIMULATOR IMPLANT LEFT SIDED;  Surgeon: Consuella Lose, MD;  Location: [place] NEURO ORS;  Service: Neurosurgery;  Laterality: N/A;    Prior to Admission medications   Medication Sig Start Date End Date Taking? Authorizing Provider  aspirin EC 81 MG EC tablet Take 1 tablet (81 mg total) by mouth daily. 02/09/16   Theodoro Grist, MD  atorvastatin (LIPITOR) 40 MG tablet Take 1 tablet (40 mg total) by  mouth at bedtime. 02/08/16   Theodoro Grist, MD  Calcium Carbonate-Vitamin D (CALCIUM 600+D) 600-200 MG-UNIT TABS Take 1 tablet by mouth 2 (two) times daily.    [provider]  carbamazepine (TEGRETOL XR) 200 MG 12 hr tablet Take 3 tablets (600 mg total) by mouth 2 (two) times daily. 800 mg every morning and 600 mg at bedtime 02/20/16   Fritzi Mandes, MD  clopidogrel (PLAVIX) 75 MG tablet Take 1 tablet (75 mg total) by mouth daily. 02/10/16   Theodoro Grist, MD  guaiFENesin-dextromethorphan (ROBITUSSIN DM) 100-10 MG/5ML syrup Take 5 mLs by mouth every 4 (four) hours as needed for cough (chest congestion). 02/20/16   Fritzi Mandes, MD  HYDROcodone-acetaminophen (NORCO) 5-325 MG tablet Take 1 tablet by mouth every 6 (six) hours as needed for moderate pain. 11/25/18   Duanne Guess, PA-C  lamoTRIgine (LAMICTAL) 200 MG tablet Take 200 mg by mouth 2 (two) times daily.    [provider]  losartan (COZAAR) 100 MG tablet Take 1 tablet (100 mg total) by mouth daily. 02/10/16   Theodoro Grist, MD  oxyCODONE-acetaminophen (PERCOCET) 5-325 MG tablet Take 1 tablet by mouth every 6 (six) hours as needed for severe pain. 04/07/17   Lavonia Drafts, MD    Allergies Patient has no known allergies.  History reviewed. No pertinent family history.  Social History Social History   Tobacco Use  . Smoking status: Never Smoker  . Smokeless tobacco: Never Used  Substance Use Topics  . Alcohol use:  No  . Drug use: No    Review of Systems  Constitutional: No fever. Eyes: No visual changes. ENT: Positive for sore throat. Cardiovascular: Positive for chest pain. Respiratory: Denies shortness of breath. Gastrointestinal: No vomiting or diarrhea.  Genitourinary: Negative for flank pain.  Musculoskeletal: Negative for back pain. Skin: Negative for rash. Neurological: Negative for headache.   ____________________________________________   PHYSICAL EXAM:  VITAL SIGNS: ED Triage Vitals   Enc Vitals Group     BP 03/03/19 0527 (!) 146/99     Pulse Rate 03/03/19 0527 77     Resp 03/03/19 0527 18     Temp 03/03/19 0527 98.1 F (36.7 C)     Temp Source 03/03/19 0527 Oral     SpO2 03/03/19 0527 97 %     Weight 03/03/19 0534 180 lb (81.6 kg)     Height 03/03/19 0534 5\' 7"  (1.702 m)     Head Circumference --      Peak Flow --      Pain Score 03/03/19 0534 1     Pain Loc --      Pain Edu? --      Excl. in [place]? --     Constitutional: Alert and oriented. Well appearing and in no acute distress. Eyes: Conjunctivae are normal.  Head: Atraumatic. Nose: No congestion/rhinnorhea. Mouth/Throat: Mucous membranes are moist.   Neck: Normal range of motion.  Cardiovascular:   Good peripheral circulation. Respiratory: Normal respiratory effort.  No retractions.  Gastrointestinal: No distention.  Musculoskeletal: No lower extremity edema.  No calf or popliteal swelling or tenderness.  Extremities warm and well perfused.  Neurologic:  Normal speech and language. No gross focal neurologic deficits are appreciated.  Skin:  Skin is warm and dry. No rash noted. Psychiatric: Mood and affect are normal. Speech and behavior are normal.  ____________________________________________   LABS (all labs ordered are listed, but only abnormal results are displayed)  Labs Reviewed  BASIC METABOLIC PANEL - Abnormal; Notable for the following components:      Result Value   Sodium 131 (*)    Chloride 91 (*)    All other components within normal limits  CBC  TROPONIN I (HIGH SENSITIVITY)  TROPONIN I (HIGH SENSITIVITY)   ____________________________________________  EKG  ED ECG REPORT I, Arta Silence, the attending physician, personally viewed and interpreted this ECG.  Date: 03/03/2019 EKG Time: 0522 Rate: 74 Rhythm: normal sinus rhythm QRS Axis: normal Intervals: normal ST/T Wave abnormalities: Nonspecific abnormalities anteriorly Narrative Interpretation: no evidence of  acute ischemia; no significant change when compared to EKG of 03/18/2017  ____________________________________________  RADIOLOGY  CXR: Bilateral hazy opacities at the lung bases consistent with COVID-19  ____________________________________________   PROCEDURES  Procedure(s) performed: No  Procedures  Critical Care performed: No ____________________________________________   INITIAL IMPRESSION / ASSESSMENT AND PLAN / ED COURSE  Pertinent labs & imaging results that were available during my care of the patient were reviewed by me and considered in my medical decision making (see chart for details).  54 year old male with PMH as noted above including hypertension and seizure disorder presents with atypical and nonexertional chest pain since last night, with no significant associated symptoms.  The patient was diagnosed with COVID-19 5 days ago.  He states he has generally been doing well as far as the COVID-19, with no shortness of breath, significant cough, or high fevers.  He mainly reports a sore throat.  On exam, the patient is well-appearing.  His vital signs are  normal except for hypertension.  He has no increased work of breathing or respiratory distress and his O2 saturation is in the high 90s on room air.  The remainder of the exam is unremarkable.  EKG shows no significant findings, and is unchanged from a prior EKG in 2019.  Lab work-up was obtained on arrival, and is within normal limits.  The troponin is negative.  Chest x-ray shows mild bilateral hazy opacities consistent with COVID-19.  Overall I suspect that the chest pain is either related to the lung inflammation, reflux, or musculoskeletal pain.  There is no evidence of ACS.  The patient has no signs or symptoms to suggest DVT or PE, and there is no evidence of aortic dissection or other vascular etiology.  Given the duration of the pain now greater than 3 hours, there is no indication for a repeat troponin.  The  patient feels comfortable and would like to go home.  I counseled him on the results of the work-up.  Return precautions given, and he expressed understanding.  _____________________________  Jared Grimes was evaluated in Emergency Department on 03/03/2019 for the symptoms described in the history of present illness. He was evaluated in the context of the global COVID-19 pandemic, which necessitated consideration that the patient might be at risk for infection with the SARS-CoV-2 virus that causes COVID-19. Institutional protocols and algorithms that pertain to the evaluation of patients at risk for COVID-19 are in a state of rapid change based on information released by regulatory bodies including the CDC and federal and state organizations. These policies and algorithms were followed during the patient's care in the ED. ____________________________________________   FINAL CLINICAL IMPRESSION(S) / ED DIAGNOSES  Final diagnoses:  COVID-19  Atypical chest pain      NEW MEDICATIONS STARTED DURING THIS VISIT:  New Prescriptions   No medications on file     Note:  This document was prepared using Dragon voice recognition software and may include unintentional dictation errors.   Arta Silence, MD 03/03/19 [phone]

## 2019-03-03 NOTE — ED Triage Notes (Signed)
Pt to triage with slow steady gait. Pt reports Dx'd with COVID on 02/27/19 at [facility]. This morning pt reports mid upper chest pain that started about 1 hour ago. Denies shortness of breath, N/V or diaphoresis.

## 2019-03-28 ENCOUNTER — Other Ambulatory Visit: Payer: Self-pay | Admitting: Neurology

## 2019-03-28 ENCOUNTER — Other Ambulatory Visit (HOSPITAL_COMMUNITY): Payer: Self-pay | Admitting: Neurology

## 2019-03-28 DIAGNOSIS — R29810 Facial weakness: Secondary | ICD-10-CM

## 2019-04-08 ENCOUNTER — Other Ambulatory Visit: Payer: Self-pay

## 2019-04-08 ENCOUNTER — Ambulatory Visit
Admission: RE | Admit: 2019-04-08 | Discharge: 2019-04-08 | Disposition: A | Discharge status: Home / Self Care | Payer: Medicare Other | Source: Ambulatory Visit | Attending: Neurology | Admitting: Neurology

## 2019-04-08 DIAGNOSIS — R29810 Facial weakness: Secondary | ICD-10-CM | POA: Diagnosis present

## 2019-04-08 MED ORDER — GADOBUTROL 1 MMOL/ML IV SOLN
8.0000 mL | Freq: Once | INTRAVENOUS | Status: AC | PRN
  Administered 2019-04-08: 8 mL via INTRAVENOUS

## 2020-08-25 ENCOUNTER — Emergency Department: Payer: Medicare Other

## 2020-08-25 ENCOUNTER — Other Ambulatory Visit: Payer: Self-pay

## 2020-08-25 ENCOUNTER — Encounter: Payer: Self-pay | Admitting: *Deleted

## 2020-08-25 ENCOUNTER — Emergency Department
Admission: EM | Admit: 2020-08-25 | Discharge: 2020-08-25 | Disposition: A | Discharge status: Home / Self Care | Payer: Medicare Other | Attending: Emergency Medicine | Admitting: Emergency Medicine

## 2020-08-25 DIAGNOSIS — S32019A Unspecified fracture of first lumbar vertebra, initial encounter for closed fracture: Secondary | ICD-10-CM | POA: Diagnosis not present

## 2020-08-25 DIAGNOSIS — Z79899 Other long term (current) drug therapy: Secondary | ICD-10-CM | POA: Diagnosis not present

## 2020-08-25 DIAGNOSIS — Z7982 Long term (current) use of aspirin: Secondary | ICD-10-CM | POA: Diagnosis not present

## 2020-08-25 DIAGNOSIS — G40909 Epilepsy, unspecified, not intractable, without status epilepticus: Secondary | ICD-10-CM | POA: Diagnosis not present

## 2020-08-25 DIAGNOSIS — I10 Essential (primary) hypertension: Secondary | ICD-10-CM | POA: Diagnosis not present

## 2020-08-25 DIAGNOSIS — S32010A Wedge compression fracture of first lumbar vertebra, initial encounter for closed fracture: Secondary | ICD-10-CM

## 2020-08-25 DIAGNOSIS — W19XXXA Unspecified fall, initial encounter: Secondary | ICD-10-CM | POA: Diagnosis not present

## 2020-08-25 DIAGNOSIS — S3992XA Unspecified injury of lower back, initial encounter: Secondary | ICD-10-CM | POA: Diagnosis present

## 2020-08-25 DIAGNOSIS — R569 Unspecified convulsions: Secondary | ICD-10-CM

## 2020-08-25 LAB — CBC
HCT: 39.5 % (ref 39.0–52.0)
Hemoglobin: 13.8 g/dL (ref 13.0–17.0)
MCH: 30.2 pg (ref 26.0–34.0)
MCHC: 34.9 g/dL (ref 30.0–36.0)
MCV: 86.4 fL (ref 80.0–100.0)
Platelets: 361 10*3/uL (ref 150–400)
RBC: 4.57 MIL/uL (ref 4.22–5.81)
RDW: 12.3 % (ref 11.5–15.5)
WBC: 9.8 10*3/uL (ref 4.0–10.5)
nRBC: 0 % (ref 0.0–0.2)

## 2020-08-25 LAB — BASIC METABOLIC PANEL
Anion gap: 8 (ref 5–15)
BUN: 19 mg/dL (ref 6–20)
CO2: 26 mmol/L (ref 22–32)
Calcium: 9.6 mg/dL (ref 8.9–10.3)
Chloride: 92 mmol/L — ABNORMAL LOW (ref 98–111)
Creatinine, Ser: 0.74 mg/dL (ref 0.61–1.24)
GFR, Estimated: >60 mL/min (ref >60)
Glucose, Bld: 97 mg/dL (ref 70–99)
Potassium: 4.1 mmol/L (ref 3.5–5.1)
Sodium: 126 mmol/L — ABNORMAL LOW (ref 135–145)

## 2020-08-25 LAB — CARBAMAZEPINE LEVEL, TOTAL: Carbamazepine Lvl: 9.3 ug/mL (ref 4.0–12.0)

## 2020-08-25 MED ORDER — ACETAMINOPHEN 500 MG PO TABS
1000.0000 mg | ORAL_TABLET | Freq: Once | ORAL | Status: AC
  Administered 2020-08-25: 1000 mg via ORAL
  Filled 2020-08-25: qty 2

## 2020-08-25 MED ORDER — LIDOCAINE 5 % EX PTCH
1.0000 | MEDICATED_PATCH | CUTANEOUS | Status: DC
  Administered 2020-08-25: 1 via TRANSDERMAL
  Filled 2020-08-25: qty 1

## 2020-08-25 MED ORDER — LACTATED RINGERS IV BOLUS
1000.0000 mL | Freq: Once | INTRAVENOUS | Status: AC
  Administered 2020-08-25: 1000 mL via INTRAVENOUS

## 2020-08-25 NOTE — ED Triage Notes (Signed)
Pt reports hx of seizures, takes tegretol and lamictal (taking as prescribed). States he had 2 seizures today. For the first seizure he says he woke up on the floor of his bedroom and the second he woke up on the floor of the living room. He c/o pain in the lower back.
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
## 2020-08-27 LAB — LAMOTRIGINE LEVEL: Lamotrigine Lvl: 3.9 ug/mL (ref 2.0–20.0)

## 2020-09-07 ENCOUNTER — Encounter: Payer: Self-pay | Admitting: Emergency Medicine

## 2020-09-07 ENCOUNTER — Other Ambulatory Visit: Payer: Self-pay

## 2020-09-07 ENCOUNTER — Inpatient Hospital Stay
Admission: EM | Admit: 2020-09-07 | Discharge: 2020-09-10 | DRG: 378 | Disposition: A | Discharge status: Home / Self Care | Payer: Medicare Other | Attending: Internal Medicine | Admitting: Internal Medicine

## 2020-09-07 ENCOUNTER — Emergency Department: Payer: Medicare Other

## 2020-09-07 DIAGNOSIS — D62 Acute posthemorrhagic anemia: Secondary | ICD-10-CM | POA: Diagnosis present

## 2020-09-07 DIAGNOSIS — E871 Hypo-osmolality and hyponatremia: Secondary | ICD-10-CM | POA: Diagnosis not present

## 2020-09-07 DIAGNOSIS — K254 Chronic or unspecified gastric ulcer with hemorrhage: Principal | ICD-10-CM | POA: Diagnosis present

## 2020-09-07 DIAGNOSIS — Z79899 Other long term (current) drug therapy: Secondary | ICD-10-CM

## 2020-09-07 DIAGNOSIS — K922 Gastrointestinal hemorrhage, unspecified: Secondary | ICD-10-CM | POA: Diagnosis present

## 2020-09-07 DIAGNOSIS — I10 Essential (primary) hypertension: Secondary | ICD-10-CM | POA: Diagnosis present

## 2020-09-07 DIAGNOSIS — R296 Repeated falls: Secondary | ICD-10-CM

## 2020-09-07 DIAGNOSIS — Z9689 Presence of other specified functional implants: Secondary | ICD-10-CM

## 2020-09-07 DIAGNOSIS — I959 Hypotension, unspecified: Secondary | ICD-10-CM | POA: Diagnosis present

## 2020-09-07 DIAGNOSIS — Z8673 Personal history of transient ischemic attack (TIA), and cerebral infarction without residual deficits: Secondary | ICD-10-CM

## 2020-09-07 DIAGNOSIS — G40909 Epilepsy, unspecified, not intractable, without status epilepticus: Secondary | ICD-10-CM | POA: Diagnosis present

## 2020-09-07 DIAGNOSIS — K921 Melena: Secondary | ICD-10-CM

## 2020-09-07 DIAGNOSIS — Z7902 Long term (current) use of antithrombotics/antiplatelets: Secondary | ICD-10-CM

## 2020-09-07 DIAGNOSIS — Z20822 Contact with and (suspected) exposure to covid-19: Secondary | ICD-10-CM | POA: Diagnosis present

## 2020-09-07 DIAGNOSIS — Z9181 History of falling: Secondary | ICD-10-CM | POA: Diagnosis not present

## 2020-09-07 DIAGNOSIS — R55 Syncope and collapse: Secondary | ICD-10-CM

## 2020-09-07 DIAGNOSIS — E222 Syndrome of inappropriate secretion of antidiuretic hormone: Secondary | ICD-10-CM | POA: Diagnosis present

## 2020-09-07 DIAGNOSIS — R262 Difficulty in walking, not elsewhere classified: Secondary | ICD-10-CM | POA: Diagnosis present

## 2020-09-07 DIAGNOSIS — Z7982 Long term (current) use of aspirin: Secondary | ICD-10-CM

## 2020-09-07 LAB — CBC
HCT: 25.9 % — ABNORMAL LOW (ref 39.0–52.0)
Hemoglobin: 8.7 g/dL — ABNORMAL LOW (ref 13.0–17.0)
MCH: 30.1 pg (ref 26.0–34.0)
MCHC: 33.6 g/dL (ref 30.0–36.0)
MCV: 89.6 fL (ref 80.0–100.0)
Platelets: 269 10*3/uL (ref 150–400)
RBC: 2.89 MIL/uL — ABNORMAL LOW (ref 4.22–5.81)
RDW: 13 % (ref 11.5–15.5)
WBC: 7.2 10*3/uL (ref 4.0–10.5)
nRBC: 0 % (ref 0.0–0.2)

## 2020-09-07 LAB — HEPATIC FUNCTION PANEL
ALT: 19 U/L (ref 0–44)
AST: 22 U/L (ref 15–41)
Albumin: 4 g/dL (ref 3.5–5.0)
Alkaline Phosphatase: 51 U/L (ref 38–126)
Bilirubin, Direct: <0.1 mg/dL (ref 0.0–0.2)
Total Bilirubin: 0.6 mg/dL (ref 0.3–1.2)
Total Protein: 6.5 g/dL (ref 6.5–8.1)

## 2020-09-07 LAB — BASIC METABOLIC PANEL
Anion gap: 4 — ABNORMAL LOW (ref 5–15)
BUN: 53 mg/dL — ABNORMAL HIGH (ref 6–20)
CO2: 27 mmol/L (ref 22–32)
Calcium: 8.2 mg/dL — ABNORMAL LOW (ref 8.9–10.3)
Chloride: 98 mmol/L (ref 98–111)
Creatinine, Ser: 0.76 mg/dL (ref 0.61–1.24)
GFR, Estimated: >60 mL/min (ref >60)
Glucose, Bld: 111 mg/dL — ABNORMAL HIGH (ref 70–99)
Potassium: 4.8 mmol/L (ref 3.5–5.1)
Sodium: 129 mmol/L — ABNORMAL LOW (ref 135–145)

## 2020-09-07 LAB — URINALYSIS, COMPLETE (UACMP) WITH MICROSCOPIC
Bacteria, UA: NONE SEEN
Bilirubin Urine: NEGATIVE
Glucose, UA: NEGATIVE mg/dL
Hgb urine dipstick: NEGATIVE
Ketones, ur: NEGATIVE mg/dL
Leukocytes,Ua: NEGATIVE
Nitrite: NEGATIVE
Protein, ur: NEGATIVE mg/dL
Specific Gravity, Urine: 1.017 (ref 1.005–1.030)
Squamous Epithelial / HPF: NONE SEEN (ref 0–5)
pH: 5 (ref 5.0–8.0)

## 2020-09-07 LAB — RESP PANEL BY RT-PCR (FLU A&B, COVID) ARPGX2
Influenza A by PCR: NEGATIVE
Influenza B by PCR: NEGATIVE
SARS Coronavirus 2 by RT PCR: NEGATIVE

## 2020-09-07 LAB — PROTIME-INR
INR: 1 (ref 0.8–1.2)
Prothrombin Time: 12.9 seconds (ref 11.4–15.2)

## 2020-09-07 LAB — ETHANOL: Alcohol, Ethyl (B): <10 mg/dL (ref <10)

## 2020-09-07 MED ORDER — HYDROCODONE-ACETAMINOPHEN 5-325 MG PO TABS
1.0000 | ORAL_TABLET | ORAL | Status: DC | PRN
  Administered 2020-09-07 – 2020-09-08 (×3): 2 via ORAL
  Filled 2020-09-07 (×3): qty 2

## 2020-09-07 MED ORDER — LAMOTRIGINE 100 MG PO TABS
300.0000 mg | ORAL_TABLET | Freq: Two times a day (BID) | ORAL | Status: DC
  Administered 2020-09-07 – 2020-09-10 (×6): 300 mg via ORAL
  Filled 2020-09-07 (×6): qty 3

## 2020-09-07 MED ORDER — ACETAMINOPHEN 325 MG PO TABS
650.0000 mg | ORAL_TABLET | Freq: Four times a day (QID) | ORAL | Status: DC | PRN
  Administered 2020-09-08 – 2020-09-09 (×2): 650 mg via ORAL
  Filled 2020-09-07 (×2): qty 2

## 2020-09-07 MED ORDER — SODIUM CHLORIDE 0.9 % IV SOLN: INTRAVENOUS | Status: DC

## 2020-09-07 MED ORDER — CARBAMAZEPINE ER 200 MG PO TB12
800.0000 mg | ORAL_TABLET | Freq: Every morning | ORAL | Status: DC
  Administered 2020-09-08 – 2020-09-10 (×3): 800 mg via ORAL
  Filled 2020-09-07 (×3): qty 4

## 2020-09-07 MED ORDER — TRAZODONE HCL 100 MG PO TABS
200.0000 mg | ORAL_TABLET | Freq: Every evening | ORAL | Status: DC | PRN
  Administered 2020-09-07 – 2020-09-09 (×3): 200 mg via ORAL
  Filled 2020-09-07 (×3): qty 2

## 2020-09-07 MED ORDER — PANTOPRAZOLE SODIUM 40 MG IV SOLR
40.0000 mg | Freq: Once | INTRAVENOUS | Status: AC
  Administered 2020-09-07: 40 mg via INTRAVENOUS
  Filled 2020-09-07: qty 40

## 2020-09-07 MED ORDER — ONDANSETRON HCL 4 MG PO TABS: 4.0000 mg | ORAL_TABLET | Freq: Four times a day (QID) | ORAL | Status: DC | PRN

## 2020-09-07 MED ORDER — CARBAMAZEPINE ER 200 MG PO TB12: 600.0000 mg | ORAL_TABLET | Freq: Two times a day (BID) | ORAL | Status: DC

## 2020-09-07 MED ORDER — CARBAMAZEPINE ER 200 MG PO TB12
600.0000 mg | ORAL_TABLET | Freq: Every day | ORAL | Status: DC
  Administered 2020-09-07 – 2020-09-09 (×3): 600 mg via ORAL
  Filled 2020-09-07 (×4): qty 3

## 2020-09-07 MED ORDER — THIAMINE HCL 100 MG/ML IJ SOLN
500.0000 mg | Freq: Once | INTRAMUSCULAR | Status: AC
  Administered 2020-09-07: 500 mg via INTRAVENOUS
  Filled 2020-09-07: qty 6

## 2020-09-07 MED ORDER — PANTOPRAZOLE SODIUM 40 MG IV SOLR: 40.0000 mg | INTRAVENOUS | Status: DC

## 2020-09-07 MED ORDER — LACTATED RINGERS IV BOLUS
1000.0000 mL | Freq: Once | INTRAVENOUS | Status: AC
  Administered 2020-09-07: 1000 mL via INTRAVENOUS

## 2020-09-07 MED ORDER — ONDANSETRON HCL 4 MG/2ML IJ SOLN: 4.0000 mg | Freq: Four times a day (QID) | INTRAMUSCULAR | Status: DC | PRN

## 2020-09-07 MED ORDER — ACETAMINOPHEN 650 MG RE SUPP: 650.0000 mg | Freq: Four times a day (QID) | RECTAL | Status: DC | PRN

## 2020-09-07 MED ORDER — DULOXETINE HCL 20 MG PO CPEP
20.0000 mg | ORAL_CAPSULE | Freq: Every day | ORAL | Status: DC
  Administered 2020-09-08 – 2020-09-10 (×3): 20 mg via ORAL
  Filled 2020-09-07 (×3): qty 1

## 2020-09-07 NOTE — H&P (Signed)
History and Physical    Jared Grimes UEK:[id number] DOB: Jul 08, 1965 DOA: 09/07/2020  PCP: Juluis Pitch, MD   Patient coming from: Home  I have personally briefly reviewed patient's old medical records in [place]  Chief Complaint: Syncope  HPI: Jared Grimes is a 55 y.o. 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  Patient went to check in and suddenly fell backwards hitting his face and was sent to the emergency room to be evaluated.  He lost consciousness for only a few seconds.  Patient apparently has had several falls over the past few weeks.  He has had no recent acute illness, no recent fevers, cough, shortness of breath, vomiting or diarrhea or blood in the stool.  No abdominal pain or change in bowel habit or dysuria.  He denied chest pain or shortness of breath  ED course: On arrival awake and alert, BP 90/67, pulse 94 respiration 17, afebrile, O2 sat 100% on room air.  Blood work significant for hemoglobin of 8.7 down from 13.8 a couple weeks prior.  Rectal exam in the ED revealed black stool which was guaiac positive..  Sodium 129 which is his baseline, LFTs WNL, EtOH less than 10.  Urinalysis normal  EKG, personally viewed and interpreted: Normal sinus rhythm at 96 with no acute ST-T wave changes  Imaging: CT head and C-spine without acute injury.  DG lumbar spine with no acute findings.  Similar mild compression deformity superior endplate of the L1 vertebral body  Patient given an IV fluid bolus and given IV Protonix.  Hospitalist consulted for admission.  Review of Systems: As per HPI otherwise all other systems on review of systems negative.    Past Medical History:  Diagnosis Date   Hypertension     Seizures ([place])    Stroke [facility])     Past Surgical History:  Procedure Laterality Date   COLONOSCOPY WITH PROPOFOL N/A 10/22/2015   Procedure: COLONOSCOPY WITH PROPOFOL;  Surgeon: Lollie Sails, MD;  Location: [facility] ENDOSCOPY;  Service: Endoscopy;  Laterality: N/A;   NERVE SURGERY     VAGUS NERVE STIMULATOR INSERTION N/A 02/03/2015   Procedure: VAGAL NERVE STIMULATOR IMPLANT LEFT SIDED;  Surgeon: Consuella Lose, MD;  Location: [facility] NEURO ORS;  Service: Neurosurgery;  Laterality: N/A;     reports that he has never smoked. He has never used smokeless tobacco. He reports that he does not drink alcohol and does not use drugs.  No Known Allergies  History reviewed. No pertinent family history.    Prior to Admission medications   Medication Sig Start Date End Date Taking? Authorizing Provider  aspirin EC 81 MG EC tablet Take 1 tablet (81 mg total) by mouth daily. 02/09/16   Theodoro Grist, MD  atorvastatin (LIPITOR) 40 MG tablet Take 1 tablet (40 mg total) by mouth at bedtime. 02/08/16   Theodoro Grist, MD  Calcium Carbonate-Vitamin D (CALCIUM 600+D) 600-200 MG-UNIT TABS Take 1 tablet by mouth 2 (two) times daily.    [provider]  carbamazepine (TEGRETOL XR) 200 MG 12 hr tablet Take 3 tablets (600 mg total) by mouth 2 (two) times daily. 800 mg every morning and 600 mg at bedtime 02/20/16   Fritzi Mandes, MD  clopidogrel (PLAVIX) 75 MG tablet Take 1 tablet (75  mg total) by mouth daily. 02/10/16   Theodoro Grist, MD  guaiFENesin-dextromethorphan (ROBITUSSIN DM) 100-10 MG/5ML syrup Take 5 mLs by mouth every 4 (four) hours as needed for cough (chest congestion). 02/20/16   Fritzi Mandes, MD  HYDROcodone-acetaminophen (NORCO) 5-325 MG tablet Take 1 tablet by mouth every 6 (six) hours as needed for moderate pain. 11/25/18   Duanne Guess, PA-C  lamoTRIgine (LAMICTAL) 200 MG tablet Take 200 mg by mouth 2 (two) times daily.    [provider]  losartan (COZAAR) 100 MG tablet Take 1  tablet (100 mg total) by mouth daily. 02/10/16   Theodoro Grist, MD  oxyCODONE-acetaminophen (PERCOCET) 5-325 MG tablet Take 1 tablet by mouth every 6 (six) hours as needed for severe pain. 04/07/17   Lavonia Drafts, MD    Physical Exam: Vitals:   09/07/20 1644 09/07/20 1754 09/07/20 1804 09/07/20 1900  BP: 105/61 114/81 119/82 138/83  Pulse: 80 78 78 79  Resp: 20  17 12   Temp:      TempSrc:      SpO2: 100% 100% 94% 100%  Weight:      Height:         Vitals:   09/07/20 1644 09/07/20 1754 09/07/20 1804 09/07/20 1900  BP: 105/61 114/81 119/82 138/83  Pulse: 80 78 78 79  Resp: 20  17 12   Temp:      TempSrc:      SpO2: 100% 100% 94% 100%  Weight:      Height:          Constitutional: Alert and oriented x 3 . Not in any apparent distress HEENT:      Head: Normocephalic, old right periorbital ecchymosis from prior fall         Eyes: PERLA, EOMI, Conjunctivae are normal. Sclera is non-icteric.       Mouth/Throat: Mucous membranes are moist.       Neck: Supple with no signs of meningismus. Cardiovascular: Regular rate and rhythm. No murmurs, gallops, or rubs. 2+ symmetrical distal pulses are present . No JVD. No LE edema Respiratory: Respiratory effort normal .Lungs sounds clear bilaterally. No wheezes, crackles, or rhonchi.  Gastrointestinal: Soft, non tender, and non distended with positive bowel sounds.  Genitourinary: No CVA tenderness. Musculoskeletal: Nontender with normal range of motion in all extremities. No cyanosis, or erythema of extremities. Neurologic:  Face is symmetric. Moving all extremities. No gross focal neurologic deficits . Skin: Skin is warm, dry.  Healing bruise right upper arm Psychiatric: Mood and affect are normal    Labs on Admission: I have personally reviewed following labs and imaging studies  CBC: Recent Labs  Lab 09/07/20 1530  WBC 7.2  HGB 8.7*  HCT 25.9*  MCV 89.6  PLT 791   Basic Metabolic Panel: Recent Labs  Lab 09/07/20 1530   NA 129*  K 4.8  CL 98  CO2 27  GLUCOSE 111*  BUN 53*  CREATININE 0.76  CALCIUM 8.2*   GFR: Estimated Creatinine Clearance: 105.4 mL/min (by C-G formula based on SCr of 0.76 mg/dL). Liver Function Tests: Recent Labs  Lab 09/07/20 1648  AST 22  ALT 19  ALKPHOS 51  BILITOT 0.6  PROT 6.5  ALBUMIN 4.0   No results for input(s): LIPASE, AMYLASE in the last 168 hours. No results for input(s): AMMONIA in the last 168 hours. Coagulation Profile: Recent Labs  Lab 09/07/20 1648  INR 1.0   Cardiac Enzymes: No results for input(s): CKTOTAL, CKMB, CKMBINDEX, TROPONINI in the  last 168 hours. BNP (last 3 results) No results for input(s): PROBNP in the last 8760 hours. HbA1C: No results for input(s): HGBA1C in the last 72 hours. CBG: No results for input(s): GLUCAP in the last 168 hours. Lipid Profile: No results for input(s): CHOL, HDL, LDLCALC, TRIG, CHOLHDL, LDLDIRECT in the last 72 hours. Thyroid Function Tests: No results for input(s): TSH, T4TOTAL, FREET4, T3FREE, THYROIDAB in the last 72 hours. Anemia Panel: No results for input(s): VITAMINB12, FOLATE, FERRITIN, TIBC, IRON, RETICCTPCT in the last 72 hours. Urine analysis:    Component Value Date/Time   COLORURINE YELLOW (A) 09/07/2020 1648   APPEARANCEUR CLEAR (A) 09/07/2020 1648   LABSPEC 1.017 09/07/2020 1648   PHURINE 5.0 09/07/2020 1648   GLUCOSEU NEGATIVE 09/07/2020 1648   HGBUR NEGATIVE 09/07/2020 1648   BILIRUBINUR NEGATIVE 09/07/2020 1648   KETONESUR NEGATIVE 09/07/2020 1648   PROTEINUR NEGATIVE 09/07/2020 1648   NITRITE NEGATIVE 09/07/2020 1648   LEUKOCYTESUR NEGATIVE 09/07/2020 1648    Radiological Exams on Admission: DG Lumbar Spine 2-3 Views  Result Date: 09/07/2020 CLINICAL DATA:  Lower back pain EXAM: LUMBAR SPINE - 2-3 VIEW COMPARISON:  August 25, 2020 FINDINGS: Similar mild compression deformity of the superior endplate of the L1 vertebral body. No subluxation. Alignment is normal. Multilevel  degenerative changes spine. IMPRESSION: Similar mild compression deformity of the superior endplate of the L1 vertebral body. Electronically Signed   By: Dahlia Bailiff MD   On: 09/07/2020 19:09   CT Head Wo Contrast  Result Date: 09/07/2020 CLINICAL DATA:  Head trauma EXAM: CT HEAD WITHOUT CONTRAST TECHNIQUE: Contiguous axial images were obtained from the base of the skull through the vertex without intravenous contrast. COMPARISON:  August 25, 2020 and September 06, 2011 FINDINGS: Brain: Stable encephalomalacia in the bilateral frontal lobes. Similar mild age related global parenchymal volume loss. Stable burden of chronic ischemic microvascular white matter disease. No evidence of acute large vascular territory infarction, hemorrhage, hydrocephalus, extra-axial collection or mass lesion/mass effect. Vascular: No hyperdense vessel. Atherosclerotic calcifications of the internal carotid arteries at the skull base. Skull: Soft tissue swelling over the left occiput. Negative for fracture or focal lesion. Sinuses/Orbits: Visualized portions of the paranasal sinuses and mastoid air cells are predominantly clear. Orbits are unremarkable. Other: None IMPRESSION: 1. Soft tissue swelling over the left occiput without underlying fracture or acute intracranial abnormality. 2. Stable encephalomalacia in the bilateral frontal lobes. 3. Stable burden of chronic ischemic microvascular white matter disease and global parenchymal volume loss. Electronically Signed   By: Dahlia Bailiff MD   On: 09/07/2020 18:56   CT Cervical Spine Wo Contrast  Result Date: 09/07/2020 CLINICAL DATA:  Head trauma with loss of consciousness EXAM: CT CERVICAL SPINE WITHOUT CONTRAST TECHNIQUE: Multidetector CT imaging of the cervical spine was performed without intravenous contrast. Multiplanar CT image reconstructions were also generated. COMPARISON:  08/25/2020 FINDINGS: Alignment: Facet joints are aligned without dislocation or traumatic listhesis.  Dens and lateral masses are aligned. Skull base and vertebrae: No acute fracture. No primary bone lesion or focal pathologic process. Soft tissues and spinal canal: No prevertebral fluid or swelling. No visible canal hematoma. Disc levels: Degenerative disc disease most pronounced at C5-6. Facet arthropathy most pronounced on the left at C3-4 and C4-5. No appreciable change from prior. Upper chest: Included lung apices are clear. Other: None. IMPRESSION: No acute fracture or traumatic listhesis of the cervical spine. Electronically Signed   By: Davina Poke D.O.   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  Holding both - Serial H&H and transfuse if necessary - N.p.o. except for ice chips - GI consult     Seizure disorder ([place])   Status post placement of VNS (vagus nerve stimulation) device - Continue Lamictal - Fall and seizure precautions    Frequent falls and ambulatory dysfunction - Fall precautions - Trauma work-up negative in the ED with negative CT head and C-spine    History of TIA (transient ischemic attack) - Holding Plavix due to GI bleed - Continue atorvastatin    Chronic hyponatremia secondary to SIADH - Sodium at baseline at 129  DVT prophylaxis: SCDs Code Status: full code  Family Communication:  none  Disposition Plan: Back to previous home environment Consults called: GI status:At the time of admission, it appears that the  appropriate admission status for this patient is INPATIENT. This is judged to be reasonable and necessary in order to provide the required intensity of service to ensure the patient's safety given the presenting symptoms, physical exam findings, and initial radiographic and laboratory data in the context of their  Comorbid conditions.   Patient requires inpatient status due to high intensity of service, high risk for further deterioration and high frequency of surveillance required.   I certify that at the point of admission it is my clinical judgment that the patient will require inpatient hospital care spanning beyond [place] MD [facility]     09/07/2020, 7:27 PM

## 2020-09-07 NOTE — ED Notes (Signed)
[place] sent to lab

## 2020-09-07 NOTE — ED Triage Notes (Signed)
Pt comes vis EMS from [facility] with c/o LOC. Pt has hematoma noted to head. Pt got 500 fluids, 18 g r hand.  BP-109/74 97 SR 96%

## 2020-09-07 NOTE — ED Notes (Signed)
Patient transported to CT 

## 2020-09-07 NOTE — ED Provider Notes (Signed)
[facility] Emergency Department Provider Note   ____________________________________________   Event Date/Time   First MD Initiated Contact with Patient 09/07/20 1639     (approximate)  I have reviewed the triage vital signs and the nursing notes.   HISTORY  Chief Complaint Syncope    HPI Jared Grimes is a 55 y.o. male with past medical history of hypertension, seizures, and stroke who presents to the ED following syncopal episode.  Patient arrives via EMS from [place] clinic, where he reportedly came up to the front desk but then had a syncopal episode and fell backwards, striking his head.  Patient seemed to only lose consciousness for a few seconds and denies any associated chest pain or shortness of breath.  His sister states that he has had numerous falls over the past couple of weeks, has also had issues with seizures despite being compliant with Lamictal and having vagal nerve stimulator.  Patient has bruising along his right arm and right cheek, states these were from prior falls and denies significant extremity pain, chest pain, or abdominal pain.  Sister does state that he has been dealing with significant back pain and he was coming to [place] clinic today to have imaging of his back.  She is concerned that he has been weaker than usual although he denies any fevers, cough, shortness of breath, vomiting, diarrhea, or blood in his stool.  He currently takes Plavix due to recent stroke.        Past Medical History:  Diagnosis Date   Hypertension    Seizures ([place])    Stroke [facility])     Patient Active Problem List   Diagnosis Date Noted   Syncope and collapse 09/07/2020   Frequent falls 09/07/2020   Acute blood loss anemia 09/07/2020   Melena 09/07/2020   ABLA (acute blood loss anemia) 09/07/2020   Weakness 02/18/2016   Status post placement of VNS (vagus nerve stimulation) device 02/16/2016   TIA (transient ischemic attack) 02/08/2016    Chronic hyponatremia 02/08/2016   Hyperglycemia 02/08/2016   Left leg weakness 02/07/2016   HTN, goal below 140/80 02/07/2016   Ambulatory dysfunction 02/07/2016   Seizure disorder ([place]) 02/07/2016    Past Surgical History:  Procedure Laterality Date   COLONOSCOPY WITH PROPOFOL N/A 10/22/2015   Procedure: COLONOSCOPY WITH PROPOFOL;  Surgeon: Lollie Sails, MD;  [facility] END[facility]COPY;  Service: Endoscopy;  Laterality: N/A;   NERVE SURGERY     VAGUS NERVE STIMULATOR INSERTION N/A 02/03/2015   Procedure: VAGAL NERVE STIMULATOR IMPLANT LEFT SIDED;  Surgeon: Consuella Lose, MD;  Location: [place] NEURO ORS;  Service: Neurosurgery;  Laterality: N/A;    Prior to Admission medications   Medication Sig Start Date End Date Taking? Authorizing Provider  aspirin EC 81 MG EC tablet Take 1 tablet (81 mg total) by mouth daily. 02/09/16   Theodoro Grist, MD  atorvastatin (LIPITOR) 40 MG tablet Take 1 tablet (40 mg total) by mouth at bedtime. 02/08/16   Theodoro Grist, MD  Calcium Carbonate-Vitamin D (CALCIUM 600+D) 600-200 MG-UNIT TABS Take 1 tablet by mouth 2 (two) times daily.    [provider]  carbamazepine (TEGRETOL XR) 200 MG 12 hr tablet Take 3 tablets (600 mg total) by mouth 2 (two) times daily. 800 mg every morning and 600 mg at bedtime 02/20/16   Fritzi Mandes, MD  clopidogrel (PLAVIX) 75 MG tablet Take 1 tablet (75 mg total) by mouth daily. 02/10/16   Theodoro Grist, MD  guaiFENesin-dextromethorphan (  ROBITUSSIN DM) 100-10 MG/5ML syrup Take 5 mLs by mouth every 4 (four) hours as needed for cough (chest congestion). 02/20/16   Fritzi Mandes, MD  HYDROcodone-acetaminophen (NORCO) 5-325 MG tablet Take 1 tablet by mouth every 6 (six) hours as needed for moderate pain. 11/25/18   Duanne Guess, PA-C  lamoTRIgine (LAMICTAL) 200 MG tablet Take 200 mg by mouth 2 (two) times daily.    [provider]  losartan (COZAAR) 100 MG tablet Take 1 tablet (100 mg total) by mouth daily.  02/10/16   Theodoro Grist, MD  oxyCODONE-acetaminophen (PERCOCET) 5-325 MG tablet Take 1 tablet by mouth every 6 (six) hours as needed for severe pain. 04/07/17   Lavonia Drafts, MD    Allergies Patient has no known allergies.  No family history on file.  Social History Social History   Tobacco Use   Smoking status: Never   Smokeless tobacco: Never  Substance Use Topics   Alcohol use: No   Drug use: No    Review of Systems  Constitutional: No fever/chills.  Positive for generalized weakness and multiple falls. Eyes: No visual changes. ENT: No sore throat. Cardiovascular: Denies chest pain.  Positive for syncope. Respiratory: Denies shortness of breath. Gastrointestinal: No abdominal pain.  No nausea, no vomiting.  No diarrhea.  No constipation. Genitourinary: Negative for dysuria. Musculoskeletal: Negative for back pain. Skin: Negative for rash. Neurological: Negative for headaches, focal weakness or numbness.  ____________________________________________   PHYSICAL EXAM:  VITAL SIGNS: ED Triage Vitals  Enc Vitals Group     BP 09/07/20 1500 90/67     Pulse Rate 09/07/20 1500 94     Resp 09/07/20 1500 17     Temp 09/07/20 1500 97.8 F (36.6 C)     Temp Source 09/07/20 1500 Oral     SpO2 09/07/20 1500 100 %     Weight 09/07/20 1513 179 lb 14.3 oz (81.6 kg)     Height 09/07/20 1513 5\' 7"  (1.702 m)     Head Circumference --      Peak Flow --      Pain Score 09/07/20 1513 0     Pain Loc --      Pain Edu? --      Excl. in [place]? --     Constitutional: Alert and oriented. Eyes: Conjunctivae are normal. Head: Atraumatic. Nose: No congestion/rhinnorhea. Mouth/Throat: Mucous membranes are moist. Neck: Normal ROM, no midline cervical spine tenderness to palpation. Cardiovascular: Normal rate, regular rhythm. Grossly normal heart sounds.  2+ radial pulses bilaterally. Respiratory: Normal respiratory effort.  No retractions. Lungs CTAB. Gastrointestinal: Soft and  nontender. No distention.  Rectal exam with dark brown guaiac positive stool. Genitourinary: deferred Musculoskeletal: Ecchymosis to right upper extremity with no bony tenderness, range of motion intact without pain.  No lower extremity tenderness nor edema.  Multiple areas of ecchymosis to lower back with midline lumbar spinal tenderness to palpation. Neurologic:  Normal speech and language.  Left upper extremity flaccid paralysis, chronic per patient.  Otherwise no focal neurologic deficits. Skin:  Skin is warm, dry and intact. No rash noted. Psychiatric: Mood and affect are normal. Speech and behavior are normal.  ____________________________________________   LABS (all labs ordered are listed, but only abnormal results are displayed)  Labs Reviewed  BASIC METABOLIC PANEL - Abnormal; Notable for the following components:      Result Value   Sodium 129 (*)    Glucose, Bld 111 (*)    BUN 53 (*)  Calcium 8.2 (*)    Anion gap 4 (*)    All other components within normal limits  CBC - Abnormal; Notable for the following components:   RBC 2.89 (*)    Hemoglobin 8.7 (*)    HCT 25.9 (*)    All other components within normal limits  URINALYSIS, COMPLETE (UACMP) WITH MICROSCOPIC - Abnormal; Notable for the following components:   Color, Urine YELLOW (*)    APPearance CLEAR (*)    All other components within normal limits  RESP PANEL BY RT-PCR (FLU A&B, COVID) ARPGX2  PROTIME-INR  HEPATIC FUNCTION PANEL  ETHANOL  CBG MONITORING, ED  TYPE AND SCREEN   ____________________________________________  EKG  ED ECG REPORT I, Blake Divine, the attending physician, personally viewed and interpreted this ECG.   Date: 09/07/2020  EKG Time: 15:06  Rate: 96  Rhythm: normal sinus rhythm  Axis: Normal  Intervals:none  ST&T Change: None   PROCEDURES  Procedure(s) performed (including Critical Care):  Procedures   ____________________________________________   INITIAL  IMPRESSION / ASSESSMENT AND PLAN / ED COURSE      55 year old male with past medical history of hypertension, seizure, and stroke with flaccid left upper extremity presents to the ED complaining of syncopal episode occurring at [facility] clinic earlier today.  He denies any associated chest pain or shortness of breath, EKG shows no arrhythmia or ischemia.  Low suspicion for cardiac etiology of syncope, labs remarkable for significant drop in hemoglobin from prior obtained just a couple of weeks ago.  Rectal exam is guaiac positive and patient's acute anemia could have contributed to syncope, we will hydrate with IV fluids and hold off on transfusion for now given hemoglobin remains greater than 8.  We will give dose of IV Protonix, additional labs are unremarkable.  CT head and cervical spine along with x-ray of lumbar spine to be performed to rule out traumatic injury.  CT head and cervical spine are negative for acute process, x-ray of lumbar spine is also unremarkable.  Patient remains hemodynamically stable and was given a dose of IV Protonix.  Case discussed with hospitalist for admission.      ____________________________________________   FINAL CLINICAL IMPRESSION(S) / ED DIAGNOSES  Final diagnoses:  Upper GI bleed  Syncope, unspecified syncope type     ED Discharge Orders     None        Note:  This document was prepared using Dragon voice recognition software and may include unintentional dictation errors.    Blake Divine, MD 09/07/20 1919

## 2020-09-08 ENCOUNTER — Encounter
Admission: EM | Disposition: A | Discharge status: Home / Self Care | Payer: Self-pay | Source: Home / Self Care | Attending: Internal Medicine

## 2020-09-08 ENCOUNTER — Inpatient Hospital Stay: Payer: Medicare Other | Admitting: Anesthesiology

## 2020-09-08 ENCOUNTER — Encounter: Payer: Self-pay | Admitting: Internal Medicine

## 2020-09-08 DIAGNOSIS — D62 Acute posthemorrhagic anemia: Secondary | ICD-10-CM | POA: Diagnosis not present

## 2020-09-08 DIAGNOSIS — K922 Gastrointestinal hemorrhage, unspecified: Secondary | ICD-10-CM

## 2020-09-08 HISTORY — PX: ESOPHAGOGASTRODUODENOSCOPY (EGD) WITH PROPOFOL: SHX5813

## 2020-09-08 LAB — HEMOGLOBIN
Hemoglobin: 7.8 g/dL — ABNORMAL LOW (ref 13.0–17.0)
Hemoglobin: 8.1 g/dL — ABNORMAL LOW (ref 13.0–17.0)

## 2020-09-08 LAB — HIV ANTIBODY (ROUTINE TESTING W REFLEX): HIV Screen 4th Generation wRfx: NONREACTIVE

## 2020-09-08 SURGERY — ESOPHAGOGASTRODUODENOSCOPY (EGD) WITH PROPOFOL
Anesthesia: General

## 2020-09-08 MED ORDER — PROPOFOL 500 MG/50ML IV EMUL
INTRAVENOUS | Status: DC | PRN
  Administered 2020-09-08: 150 ug/kg/min via INTRAVENOUS

## 2020-09-08 MED ORDER — PROPOFOL 10 MG/ML IV BOLUS
INTRAVENOUS | Status: DC | PRN
  Administered 2020-09-08: 80 mg via INTRAVENOUS

## 2020-09-08 MED ORDER — SODIUM CHLORIDE 0.9 % IV SOLN
INTRAVENOUS | Status: DC
  Administered 2020-09-08: 1000 mL via INTRAVENOUS

## 2020-09-08 MED ORDER — LIDOCAINE HCL (CARDIAC) PF 100 MG/5ML IV SOSY
PREFILLED_SYRINGE | INTRAVENOUS | Status: DC | PRN
  Administered 2020-09-08: 40 mg via INTRAVENOUS

## 2020-09-08 MED ORDER — PANTOPRAZOLE SODIUM 40 MG PO TBEC
40.0000 mg | DELAYED_RELEASE_TABLET | Freq: Two times a day (BID) | ORAL | Status: DC
  Administered 2020-09-08 – 2020-09-10 (×4): 40 mg via ORAL
  Filled 2020-09-08 (×4): qty 1

## 2020-09-08 NOTE — Progress Notes (Signed)
PT Cancellation Note  Patient Details Name: Jared Grimes MRN: [id number] DOB: Sep 16, 1965   Cancelled Treatment:    Reason Eval/Treat Not Completed: Patient at procedure or test/unavailable (Consult received and chart reviewed. Patient currently off unit for endoscopy.  Will re-attempt at later time/date as medically appropriate and available.)  Danice Dippolito H. Owens Shark, PT, DPT, NCS 09/08/20, 1:59 PM [phone]

## 2020-09-08 NOTE — Progress Notes (Signed)
OT Cancellation Note  Patient Details Name: Jared Grimes MRN: [id number] DOB: 07/20/1965   Cancelled Treatment:    Reason Eval/Treat Not Completed: Patient at procedure or test/ unavailable (Pt. is out of his room for an Endoscopy for evaluation of Esophagitis. Will continue to monitor, and perform the initial OT evaluation at a later date/time.)  Harrel Carina, MS, OTR/L 09/08/2020, 1:37 PM

## 2020-09-08 NOTE — Op Note (Signed)
[facility] Patient Name: Jared Grimes Procedure Date: 09/08/2020 1:12 PM MRN: [id number] Account #: [id number] Date of Birth: [birth year] Admit Type: Inpatient Age: 55 Room: [facility] ENDO ROOM 2 Gender: Male Note Status: Finalized Procedure:             Upper GI endoscopy Indications:           Melena Providers:             [name] MD, MD Referring MD:          Youlanda Roys. Lovie Macadamia, MD (Referring MD) Medicines:             Monitored Anesthesia Care Complications:         No immediate complications. Procedure:             Pre-Anesthesia Assessment:                        - Prior to the procedure, a History and Physical was                         performed, and patient medications, allergies and                         sensitivities were reviewed. The patient's tolerance                         of previous anesthesia was reviewed.                        - The risks and benefits of the procedure and the                         sedation options and risks were discussed with the                         patient. All questions were answered and informed                         consent was obtained.                        - ASA Grade Assessment: II - A patient with mild                         systemic disease.                        After obtaining informed consent, the endoscope was                         passed under direct vision. Throughout the procedure,                         the patient's blood pressure, pulse, and oxygen                         saturations were monitored continuously. The Endoscope                         was introduced through the mouth, and advanced  to the                         third part of duodenum. The upper GI endoscopy was                         accomplished with ease. The patient tolerated the                         procedure well. Findings:      The examined duodenum was normal.      The esophagus was normal.       Two non-bleeding superficial gastric ulcers with a clean ulcer base       (Forrest Class III) were found in the gastric antrum. The largest lesion       was 10 mm in largest dimension.      The exam was otherwise without abnormality.      The cardia and gastric fundus were normal on retroflexion. Impression:            - Normal examined duodenum.                        - Normal esophagus.                        - Non-bleeding gastric ulcers with a clean ulcer base                         (Forrest Class III).                        - The examination was otherwise normal.                        - No specimens collected. Recommendation:        - Return patient to hospital ward for ongoing care.                        - Full liquid diet.                        - Continue present medications.                        - Repeat upper endoscopy in 6 weeks to evaluate the                         response to therapy.                        - Use Prilosec (omeprazole) 40 mg PO BID for 3 months. Procedure Code(s):     --- Professional ---                        [phone], Esophagogastroduodenoscopy, flexible,                         transoral; diagnostic, including collection of                         specimen(s) by brushing or washing, when performed                         (  separate procedure) Diagnosis Code(s):     --- Professional ---                        K25.9, Gastric ulcer, unspecified as acute or chronic,                         without hemorrhage or perforation                        K92.1, Melena (includes Hematochezia) CPT copyright 2019 American [employer] Association. All rights reserved. The codes documented in this report are preliminary and upon coder review may  be revised to meet current compliance requirements. [name], MD [name] MD, MD 09/08/2020 1:30:45 PM This report has been signed electronically. Number of Addenda: 0 Note Initiated On: 09/08/2020 1:12 PM Estimated Blood  Loss:  Estimated blood loss: none.      [facility]

## 2020-09-08 NOTE — Anesthesia Procedure Notes (Signed)
Date/Time: 09/08/2020 12:43 PM Performed by: Doreen Salvage, CRNA Pre-anesthesia Checklist: Patient identified, Patient being monitored, Timeout performed, Emergency Drugs available and Suction available Patient Re-evaluated:Patient Re-evaluated prior to induction Oxygen Delivery Method: Simple face mask Preoxygenation: Pre-oxygenation with 100% oxygen Induction Type: IV induction Grade View: Grade I Tube type: Oral Tube size: 7.0 mm Number of attempts: 1 Airway Equipment and Method: Stylet Placement Confirmation: ETT inserted through vocal cords under direct vision, positive ETCO2 and breath sounds checked- equal and bilateral Secured at: 21 cm Tube secured with: Tape Dental Injury: Teeth and Oropharynx as per pre-operative assessment  Comments: Nasal cannula with etCO2 monitoring

## 2020-09-08 NOTE — Anesthesia Preprocedure Evaluation (Signed)
Anesthesia Evaluation  Patient identified by MRN, date of birth, ID band Patient awake    Reviewed: Allergy & Precautions, NPO status , Patient's Chart, lab work & pertinent test results  History of Anesthesia Complications Negative for: history of anesthetic complications  Airway Mallampati: III  TM Distance: <3 FB Neck ROM: full    Dental  (+) Chipped, Poor Dentition, Missing, Partial Upper   Pulmonary neg pulmonary ROS, neg shortness of breath,    Pulmonary exam normal        Cardiovascular Exercise Tolerance: Good hypertension, (-) anginaNormal cardiovascular exam     Neuro/Psych Seizures -,  TIACVA negative psych ROS   GI/Hepatic negative GI ROS, Neg liver ROS, neg GERD  ,  Endo/Other  negative endocrine ROS  Renal/GU negative Renal ROS  negative genitourinary   Musculoskeletal   Abdominal   Peds  Hematology negative hematology ROS (+)   Anesthesia Other Findings Past Medical History: No date: Hypertension No date: Seizures ([place]) No date: Stroke [facility])  Past Surgical History: 10/22/2015: COLONOSCOPY WITH PROPOFOL; N/A     Comment:  Procedure: COLONOSCOPY WITH PROPOFOL;  Surgeon: Lollie Sails, MD;  Location: [facility] ENDOSCOPY;  Service:               Endoscopy;  Laterality: N/A; No date: NERVE SURGERY 02/03/2015: VAGUS NERVE STIMULATOR INSERTION; N/A     Comment:  Procedure: VAGAL NERVE STIMULATOR IMPLANT LEFT SIDED;                Surgeon: Consuella Lose, MD;  Location: [place] NEURO ORS;               Service: Neurosurgery;  Laterality: N/A;  BMI    Body Mass Index: 30.59 kg/m      Reproductive/Obstetrics negative OB ROS                             Anesthesia Physical Anesthesia Plan  ASA: 3  Anesthesia Plan: General   Post-op Pain Management:    Induction: Intravenous  PONV Risk Score and Plan: Propofol infusion and TIVA  Airway Management Planned:  Natural Airway and Nasal Cannula  Additional Equipment:   Intra-op Plan:   Post-operative Plan:   Informed Consent: I have reviewed the patients History and Physical, chart, labs and discussed the procedure including the risks, benefits and alternatives for the proposed anesthesia with the patient or authorized representative who has indicated his/her understanding and acceptance.     Dental Advisory Given  Plan Discussed with: Anesthesiologist, CRNA and Surgeon  Anesthesia Plan Comments: (Patient consented for risks of anesthesia including but not limited to:  - adverse reactions to medications - risk of airway placement if required - damage to eyes, teeth, lips or other oral mucosa - nerve damage due to positioning  - sore throat or hoarseness - Damage to heart, brain, nerves, lungs, other parts of body or loss of life  Patient voiced understanding.)        Anesthesia Quick Evaluation

## 2020-09-08 NOTE — Transfer of Care (Signed)
Immediate Anesthesia Transfer of Care Note  Patient: Jared Grimes  Procedure(s) Performed: Procedure(s): ESOPHAGOGASTRODUODENOSCOPY (EGD) WITH PROPOFOL (N/A)  Patient Location: PACU and Endoscopy Unit  Anesthesia Type:General  Level of Consciousness: sedated  Airway & Oxygen Therapy: Patient Spontanous Breathing and Patient connected to nasal cannula oxygen  Post-op Assessment: Report given to RN and Post -op Vital signs reviewed and stable  Post vital signs: Reviewed and stable  Last Vitals:  Vitals:   09/08/20 1315 09/08/20 1332  BP: (!) 146/86 (!) 148/74  Pulse: 73   Resp: 18   Temp: 37.8 C 37.2 C  SpO2: 295%     Complications: No apparent anesthesia complications

## 2020-09-08 NOTE — H&P (Signed)
[name], MD [address] [place], [place], [place], [address] [address], [place], [place], [place], [address] Phone: [phone]  Fax: [phone]  Primary Care Physician:  Juluis Pitch, MD   Pre-Procedure History & Physical: HPI:  Jared Grimes is a 55 y.o. male is here for an endoscopy    Past Medical History:  Diagnosis Date   Hypertension    Seizures ([place])    Stroke [facility])     Past Surgical History:  Procedure Laterality Date   COLONOSCOPY WITH PROPOFOL N/A 10/22/2015   Procedure: COLONOSCOPY WITH PROPOFOL;  Surgeon: Lollie Sails, MD;  Location: [facility] ENDOSCOPY;  Service: Endoscopy;  Laterality: N/A;   NERVE SURGERY     VAGUS NERVE STIMULATOR INSERTION N/A 02/03/2015   Procedure: VAGAL NERVE STIMULATOR IMPLANT LEFT SIDED;  Surgeon: Consuella Lose, MD;  Location: [place] NEURO ORS;  Service: Neurosurgery;  Laterality: N/A;    Prior to Admission medications   Medication Sig Start Date End Date Taking? Authorizing Provider  amLODipine (NORVASC) 5 MG tablet Take 5 mg by mouth daily. 08/24/20  Yes [provider]  aspirin EC 81 MG EC tablet Take 1 tablet (81 mg total) by mouth daily. 02/09/16  Yes Theodoro Grist, MD  atenolol (TENORMIN) 50 MG tablet Take 50 mg by mouth daily. 07/28/20  Yes [provider]  atorvastatin (LIPITOR) 40 MG tablet Take 1 tablet (40 mg total) by mouth at bedtime. 02/08/16  Yes Theodoro Grist, MD  betamethasone dipropionate 0.05 % cream Apply 1 application topically 2 (two) times daily as needed. 07/28/20  Yes [provider]  Calcium Carbonate-Vitamin D 600-200 MG-UNIT TABS Take 1 tablet by mouth 2 (two) times daily.   Yes [provider]  carbamazepine (TEGRETOL XR) 200 MG 12 hr tablet Take 3 tablets (600 mg total) by mouth 2 (two) times daily. 800 mg every morning and 600 mg at bedtime 02/20/16  Yes Fritzi Mandes, MD  cholecalciferol (VITAMIN D3) 25 MCG (1000 UNIT) tablet Take 1 tablet by mouth  daily.   Yes [provider]  Cyanocobalamin ([employer] B-12 PO) Take 2 tablets by mouth daily.   Yes [provider]  cyclobenzaprine (FLEXERIL) 10 MG tablet Take 10 mg by mouth 2 (two) times daily as needed. 08/13/20  Yes [provider]  DULoxetine (CYMBALTA) 20 MG capsule Take 20 mg by mouth daily. 07/29/20  Yes [provider]  lamoTRIgine (LAMICTAL) 150 MG tablet Take 300 mg by mouth 2 (two) times daily. 07/29/20  Yes [provider]  losartan (COZAAR) 100 MG tablet Take 1 tablet (100 mg total) by mouth daily. 02/10/16  Yes Theodoro Grist, MD  losartan (COZAAR) 50 MG tablet Take 50 mg by mouth daily. 08/27/20  Yes [provider]  meloxicam (MOBIC) 15 MG tablet Take 15 mg by mouth daily as needed. 07/24/20  Yes [provider]  traZODone (DESYREL) 100 MG tablet Take 200 mg by mouth at bedtime as needed. 07/28/20  Yes [provider]  clopidogrel (PLAVIX) 75 MG tablet Take 1 tablet (75 mg total) by mouth daily. Patient not taking: Reported on 09/07/2020 02/10/16   Theodoro Grist, MD  guaiFENesin-dextromethorphan [facility] DM) 100-10 MG/5ML syrup Take 5 mLs by mouth every 4 (four) hours as needed for cough (chest congestion). Patient not taking: Reported on 09/07/2020 02/20/16   Fritzi Mandes, MD  HYDROcodone-acetaminophen [facility]) 5-325 MG tablet Take 1 tablet by mouth every 6 (six) hours as needed for moderate pain. Patient not  taking: Reported on 09/07/2020 11/25/18   Duanne Guess, PA-C  lamoTRIgine (LAMICTAL) 200 MG tablet Take 200 mg by mouth 2 (two) times daily. Patient not taking: Reported on 09/07/2020    [provider]  oxyCODONE-acetaminophen (PERCOCET) 5-325 MG tablet Take 1 tablet by mouth every 6 (six) hours as needed for severe pain. Patient not taking: Reported on 09/07/2020 04/07/17   Lavonia Drafts, MD    Allergies as of 09/07/2020   (No Known Allergies)    History reviewed. No pertinent family  history.  Social History   Socioeconomic History   Marital status: Single    Spouse name: Not on file   Number of children: Not on file   Years of education: Not on file   Highest education level: Not on file  Occupational History   Not on file  Tobacco Use   Smoking status: Never   Smokeless tobacco: Never  Substance and Sexual Activity   Alcohol use: No   Drug use: No   Sexual activity: Not on file  Other Topics Concern   Not on file  Social History Narrative   Not on file   Social Determinants of Health   Financial Resource Strain: Not on file  Food Insecurity: Not on file  Transportation Needs: Not on file  Physical Activity: Not on file  Stress: Not on file  Social Connections: Not on file  Intimate Partner Violence: Not on file    Review of Systems: See HPI, otherwise negative ROS  Physical Exam: BP 136/76 (BP Location: Right Arm)   Pulse 74   Temp 98.1 F (36.7 C) (Oral)   Resp 17   Ht 5\' 7"  (1.702 m)   Wt 88.6 kg   SpO2 99%   BMI 30.59 kg/m  General:   Alert,  pleasant and cooperative in NAD Head:  Normocephalic and atraumatic. Neck:  Supple; no masses or thyromegaly. Lungs:  Clear throughout to auscultation, normal respiratory effort.    Heart:  +S1, +S2, Regular rate and rhythm, No edema. Abdomen:  Soft, nontender and nondistended. Normal bowel sounds, without guarding, and without rebound.   Neurologic:  Alert and  oriented x4;  grossly normal neurologically.  Impression/Plan: TRAVES MAJCHRZAK is here for an endoscopy  to be performed for  evaluation of esophagitis    Risks, benefits, limitations, and alternatives regarding endoscopy have been reviewed with the patient.  Questions have been answered.  All parties agreeable.   [name], MD  09/08/2020, 8:49 AM

## 2020-09-08 NOTE — Anesthesia Postprocedure Evaluation (Signed)
Anesthesia Post Note  Patient: Jared Grimes  Procedure(s) Performed: ESOPHAGOGASTRODUODENOSCOPY (EGD) WITH PROPOFOL  Patient location during evaluation: Endoscopy Anesthesia Type: General Level of consciousness: awake and alert and oriented Pain management: pain level controlled Vital Signs Assessment: post-procedure vital signs reviewed and stable Respiratory status: spontaneous breathing, nonlabored ventilation and respiratory function stable Cardiovascular status: blood pressure returned to baseline and stable Postop Assessment: no signs of nausea or vomiting Anesthetic complications: no   No notable events documented.   Last Vitals:  Vitals:   09/08/20 1315 09/08/20 1332  BP: (!) 146/86 (!) 148/74  Pulse: 73   Resp: 18   Temp: 37.8 C (!) 36.1 C  SpO2: 100%     Last Pain:  Vitals:   09/08/20 1352  TempSrc:   PainSc: 7                  [place] Wank

## 2020-09-08 NOTE — Progress Notes (Signed)
PROGRESS NOTE    MIKALE SILVERSMITH  XTG:[id number] DOB: May 09, 1965 DOA: 09/07/2020 PCP: Juluis Pitch, MD  [id number]   Assessment & Plan:   Principal Problem:   Acute blood loss anemia Active Problems:   Ambulatory dysfunction   Seizure disorder (HCC)   Chronic hyponatremia   Status post placement of VNS (vagus nerve stimulation) device   Syncope and collapse   Frequent falls   Melena   ABLA (acute blood loss anemia)   History of TIA (transient ischemic attack)   KACEY VICUNA is a 55 y.o. 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     Acute blood loss anemia likely 2/2 gastric ulcer - Patient with syncope and collapse secondary to symptomatic anemia with hemoglobin 8.6 down from 13.8 a couple weeks prior and dark guaiac positive stool - Meloxicam and Plavix seen on med list.  Holding both Plan: --EGD today, found 2 non-bleeding gastric ulcers --cont PPI for 3 months --outpatient followup with GI  Syncope and frequent falls --pt reported falls at home due to knee buckling, without LOS.  Pt said they were not seizure episodes --BP 90/67 on presentation.  Orthostatic BP was not obtained on presentation. - Trauma work-up negative in the ED with negative CT head and C-spine --home BP meds held on admission.  BP improved with IVF.   Plan: --cont to hold home BP meds (amlodipine, atenolol, losartan) --d/c MIVF and encourage oral hydration --PT/OT     Seizure disorder (HCC)   Status post placement of VNS (vagus nerve stimulation) device --well controlled, per pt --cont home Tegretol and Lamictal     History of TIA (transient ischemic attack) - Pt reportedly was not taking plavix at home. 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  Pt reported started having falls in last couple of weeks, due to knees buckling.  No bloody stool.  Maybe 1 episode of dark stool.     Objective: Vitals:   09/08/20 1607 09/08/20 1705 09/08/20 1708 09/08/20 1711  BP: 135/73 128/71 128/88 134/86  Pulse: 79 77 87 98  Resp: 16 17 17 18   Temp: 98.2 F (36.8 C)     TempSrc:      SpO2: 100% 96% 100% 96%  Weight:      Height:        Intake/Output Summary (Last 24 hours) at 09/08/2020 1829 Last data filed at 09/08/2020 1606 Gross per 24 hour  Intake 2739.67 ml  Output 1800 ml  Net 939.67 ml   Filed Weights   09/07/20 1513 09/07/20 2033  Weight: 81.6 kg 88.6 kg    Examination:   Constitutional: NAD, AAOx3 HEENT: conjunctivae and lids normal, EOMI CV: No cyanosis.   RESP: normal respiratory effort, on RA Extremities: non-pitting edema in BLE SKIN: warm, dry, patches of ecchymosis in  BLE. Neuro: II - XII grossly intact.   Psych: Normal mood and affect.  Appropriate judgement and reason   Data Reviewed: I have personally reviewed following labs and imaging studies  CBC: Recent Labs  Lab 09/07/20 1530 09/08/20 0001 09/08/20 0828  WBC 7.2  --   --   HGB 8.7* 7.8* 8.1*  HCT 25.9*  --   --   MCV 89.6  --   --   PLT 269  --   --    Basic Metabolic Panel: Recent Labs  Lab 09/07/20 1530  NA 129*  K 4.8  CL 98  CO2 27  GLUCOSE 111*  BUN 53*  CREATININE 0.76  CALCIUM 8.2*   GFR: Estimated Creatinine Clearance: 109.5 mL/min (by C-G formula  based on SCr of 0.76 mg/dL). Liver Function Tests: Recent Labs  Lab 09/07/20 1648  AST 22  ALT 19  ALKPHOS 51  BILITOT 0.6  PROT 6.5  ALBUMIN 4.0   No results for input(s): LIPASE, AMYLASE in the last 168 hours. No results for input(s): AMMONIA in the last 168 hours. Coagulation Profile: Recent Labs  Lab 09/07/20 1648  INR 1.0   Cardiac Enzymes: No results for input(s): CKTOTAL, CKMB, CKMBINDEX, TROPONINI in the last 168 hours. BNP (last 3 results) No results for input(s): PROBNP in the last 8760 hours. HbA1C: No results for input(s): HGBA1C in the last 72 hours. CBG: No results for input(s): GLUCAP in the last 168 hours. Lipid Profile: No results for input(s): CHOL, HDL, LDLCALC, TRIG, CHOLHDL, LDLDIRECT in the last 72 hours. Thyroid Function Tests: No results for input(s): TSH, T4TOTAL, FREET4, T3FREE, THYROIDAB in the last 72 hours. Anemia Panel: No results for input(s): VITAMINB12, FOLATE, FERRITIN, TIBC, IRON, RETICCTPCT in the last 72 hours. Sepsis Labs: No results for input(s): PROCALCITON, LATICACIDVEN in the last 168 hours.  Recent Results (from the past 240 hour(s))  Resp Panel by RT-PCR (Flu A&B, Covid) Nasopharyngeal Swab     Status: None   Collection Time: 09/07/20  6:35 PM   Specimen: Nasopharyngeal Swab; Nasopharyngeal(NP) swabs in vial transport medium  Result Value Ref Range Status   SARS Coronavirus 2 by RT PCR NEGATIVE NEGATIVE Final    Comment: (NOTE) SARS-CoV-2 target nucleic acids are NOT DETECTED.  The SARS-CoV-2 RNA is generally detectable in upper respiratory specimens during the acute phase of infection. The lowest concentration of SARS-CoV-2 viral copies this assay can detect is 138 copies/mL. A negative result does not preclude SARS-Cov-2 infection and should not be used as the sole basis for treatment or other patient management decisions. A negative result may occur with  improper specimen collection/handling, submission of specimen  other than nasopharyngeal swab, presence of viral mutation(s) within the areas targeted by this assay, and inadequate number of viral copies(<138 copies/mL). A negative result must be combined with clinical observations, patient history, and epidemiological information. The expected result is Negative.  Fact Sheet for Patients:  EntrepreneurPulse.com.au  Fact Sheet for Healthcare Providers:  IncredibleEmployment.be  This test is no t yet approved or cleared by the [place] FDA and  has been authorized for detection and/or diagnosis of SARS-CoV-2 by FDA under an Emergency Use Authorization (EUA). This EUA will remain  in effect (meaning this test can be used) for the duration of the COVID-19 declaration under Section 564(b)(1) of the Act, 21 U.S.C.section 360bbb-3(b)(1), unless the authorization is terminated  or revoked sooner.       Influenza A by PCR NEGATIVE NEGATIVE Final  Influenza B by PCR NEGATIVE NEGATIVE Final    Comment: (NOTE) The Xpert Xpress SARS-CoV-2/FLU/RSV plus assay is intended as an aid in the diagnosis of influenza from Nasopharyngeal swab specimens and should not be used as a sole basis for treatment. Nasal washings and aspirates are unacceptable for Xpert Xpress SARS-CoV-2/FLU/RSV testing.  Fact Sheet for Patients: EntrepreneurPulse.com.au  Fact Sheet for Healthcare Providers: IncredibleEmployment.be  This test is not yet approved or cleared by the [place] FDA and has been authorized for detection and/or diagnosis of SARS-CoV-2 by FDA under an Emergency Use Authorization (EUA). This EUA will remain in effect (meaning this test can be used) for the duration of the COVID-19 declaration under Section 564(b)(1) of the Act, 21 U.S.C. section 360bbb-3(b)(1), unless the authorization is terminated or revoked.  Performed at [facility], [address],  [place], [place] [address]       Radiology Studies: DG Lumbar Spine 2-3 Views  Result Date: 09/07/2020 CLINICAL DATA:  Lower back pain EXAM: LUMBAR SPINE - 2-3 VIEW COMPARISON:  August 25, 2020 FINDINGS: Similar mild compression deformity of the superior endplate of the L1 vertebral body. No subluxation. Alignment is normal. Multilevel degenerative changes spine. IMPRESSION: Similar mild compression deformity of the superior endplate of the L1 vertebral body. Electronically Signed   By: Dahlia Bailiff MD   On: 09/07/2020 19:09   CT Head Wo Contrast  Result Date: 09/07/2020 CLINICAL DATA:  Head trauma EXAM: CT HEAD WITHOUT CONTRAST TECHNIQUE: Contiguous axial images were obtained from the base of the skull through the vertex without intravenous contrast. COMPARISON:  August 25, 2020 and September 06, 2011 FINDINGS: Brain: Stable encephalomalacia in the bilateral frontal lobes. Similar mild age related global parenchymal volume loss. Stable burden of chronic ischemic microvascular white matter disease. No evidence of acute large vascular territory infarction, hemorrhage, hydrocephalus, extra-axial collection or mass lesion/mass effect. Vascular: No hyperdense vessel. Atherosclerotic calcifications of the internal carotid arteries at the skull base. Skull: Soft tissue swelling over the left occiput. Negative for fracture or focal lesion. Sinuses/Orbits: Visualized portions of the paranasal sinuses and mastoid air cells are predominantly clear. Orbits are unremarkable. Other: None IMPRESSION: 1. Soft tissue swelling over the left occiput without underlying fracture or acute intracranial abnormality. 2. Stable encephalomalacia in the bilateral frontal lobes. 3. Stable burden of chronic ischemic microvascular white matter disease and global parenchymal volume loss. Electronically Signed   By: Dahlia Bailiff MD   On: 09/07/2020 18:56   CT Cervical Spine Wo Contrast  Result Date: 09/07/2020 CLINICAL DATA:  Head trauma with  loss of consciousness EXAM: CT CERVICAL SPINE WITHOUT CONTRAST TECHNIQUE: Multidetector CT imaging of the cervical spine was performed without intravenous contrast. Multiplanar CT image reconstructions were also generated. COMPARISON:  08/25/2020 FINDINGS: Alignment: Facet joints are aligned without dislocation or traumatic listhesis. Dens and lateral masses are aligned. Skull base and vertebrae: No acute fracture. No primary bone lesion or focal pathologic process. Soft tissues and spinal canal: No prevertebral fluid or swelling. No visible canal hematoma. Disc levels: Degenerative disc disease most pronounced at C5-6. Facet arthropathy most pronounced on the left at C3-4 and C4-5. No appreciable change from prior. Upper chest: Included lung apices are clear. Other: None. IMPRESSION: No acute fracture or traumatic listhesis of the cervical spine. Electronically Signed   By: Davina Poke D.O.   On: 09/07/2020 18:57     Scheduled Meds:  carbamazepine  800 mg Oral q morning   And   carbamazepine  600 mg Oral QHS   DULoxetine  20 mg Oral Daily   lamoTRIgine  300 mg Oral BID   pantoprazole (PROTONIX) IV  40 mg Intravenous Q24H   Continuous Infusions:   LOS: 1 day     Enzo Bi, MD [facility] If 7PM-7AM, please contact night-coverage 09/08/2020, 6:29 PM

## 2020-09-08 NOTE — Consult Note (Signed)
[name] , MD [address] [place], [place], [place], [address] [address], [place], [place], [place], [address] Phone: [phone]  Fax: [phone]  Consultation  Referring Provider:     Dr. Damita Dunnings Primary Care Physician:  Juluis Pitch, MD Primary Gastroenterologist:          Reason for Consultation:     GI bleed  Date of Admission:  09/07/2020 Date of Consultation:  09/08/2020         HPI:   Jared Grimes is a 55 y.o. male presented to the emergency room yesterday with a syncopal episode.  He has a history of a seizure disorder with vagal nerve stimulator.  History of frequent falls.  He is on Plavix for TIAs.  He had a syncopal event while at the primary care physician's office yesterday.  [place] backwards hitting his face and was sent to the ER.  In the ER was found to have a hemoglobin of 8.7 g down from 13.8 a few weeks prior.  Black stool was noted on exam. He has been taking 1 tablet of meloxicam daily for many weeks for left sided pain of arm. Noted 1 black stool day before yesterday and not had a bowel movement since coming into the hospital. Denies any other NSAID use , no abdominal pain , hematemesis , nausea or vomiting .     LFTs were normal.  INR 1.0.  He had been on meloxicam. This morning hemoglobin down to 7.8 g.  On admission his BUN was 53 with creatinine of 0.76.  Past Medical History:  Diagnosis Date  . Hypertension   . Seizures ([place])   . Stroke [facility])     Past Surgical History:  Procedure Laterality Date  . COLONOSCOPY WITH PROPOFOL N/A 10/22/2015   Procedure: COLONOSCOPY WITH PROPOFOL;  Surgeon: Lollie Sails, MD;  Location: [facility] ENDOSCOPY;  Service: Endoscopy;  Laterality: N/A;  . NERVE SURGERY    . VAGUS NERVE STIMULATOR INSERTION N/A 02/03/2015   Procedure: VAGAL NERVE STIMULATOR IMPLANT LEFT SIDED;  Surgeon: Consuella Lose, MD;  Location: [facility] NEURO ORS;  Service: Neurosurgery;  Laterality: N/A;    Prior to Admission medications    Medication Sig Start Date End Date Taking? Authorizing Provider  amLODipine (NORVASC) 5 MG tablet Take 5 mg by mouth daily. 08/24/20  Yes [provider]  aspirin EC 81 MG EC tablet Take 1 tablet (81 mg total) by mouth daily. 02/09/16  Yes Theodoro Grist, MD  atenolol (TENORMIN) 50 MG tablet Take 50 mg by mouth daily. 07/28/20  Yes [provider]  atorvastatin (LIPITOR) 40 MG tablet Take 1 tablet (40 mg total) by mouth at bedtime. 02/08/16  Yes Theodoro Grist, MD  betamethasone dipropionate 0.05 % cream Apply 1 application topically 2 (two) times daily as needed. 07/28/20  Yes [provider]  Calcium Carbonate-Vitamin D 600-200 MG-UNIT TABS Take 1 tablet by mouth 2 (two) times daily.   Yes [provider]  carbamazepine (TEGRETOL XR) 200 MG 12 hr tablet Take 3 tablets (600 mg total) by mouth 2 (two) times daily. 800 mg every morning and 600 mg at bedtime 02/20/16  Yes Fritzi Mandes, MD  cholecalciferol (VITAMIN D3) 25 MCG (1000 UNIT) tablet Take 1 tablet by mouth daily.   Yes [provider]  Cyanocobalamin ([employer] B-12 PO) Take 2 tablets by mouth daily.   Yes [provider]  cyclobenzaprine (FLEXERIL) 10 MG tablet Take 10 mg by mouth 2 (two) times  daily as needed. 08/13/20  Yes [provider]  DULoxetine (CYMBALTA) 20 MG capsule Take 20 mg by mouth daily. 07/29/20  Yes [provider]  lamoTRIgine (LAMICTAL) 150 MG tablet Take 300 mg by mouth 2 (two) times daily. 07/29/20  Yes [provider]  losartan (COZAAR) 100 MG tablet Take 1 tablet (100 mg total) by mouth daily. 02/10/16  Yes Theodoro Grist, MD  losartan (COZAAR) 50 MG tablet Take 50 mg by mouth daily. 08/27/20  Yes [provider]  meloxicam (MOBIC) 15 MG tablet Take 15 mg by mouth daily as needed. 07/24/20  Yes [provider]  traZODone (DESYREL) 100 MG tablet Take 200 mg by mouth at bedtime as needed. 07/28/20  Yes [provider]   clopidogrel (PLAVIX) 75 MG tablet Take 1 tablet (75 mg total) by mouth daily. Patient not taking: Reported on 09/07/2020 02/10/16   Theodoro Grist, MD  guaiFENesin-dextromethorphan [facility] DM) 100-10 MG/5ML syrup Take 5 mLs by mouth every 4 (four) hours as needed for cough (chest congestion). Patient not taking: Reported on 09/07/2020 02/20/16   Fritzi Mandes, MD  HYDROcodone-acetaminophen [facility]) 5-325 MG tablet Take 1 tablet by mouth every 6 (six) hours as needed for moderate pain. Patient not taking: Reported on 09/07/2020 11/25/18   Duanne Guess, PA-C  lamoTRIgine (LAMICTAL) 200 MG tablet Take 200 mg by mouth 2 (two) times daily. Patient not taking: Reported on 09/07/2020    [provider]  oxyCODONE-acetaminophen (PERCOCET) 5-325 MG tablet Take 1 tablet by mouth every 6 (six) hours as needed for severe pain. Patient not taking: Reported on 09/07/2020 04/07/17   Lavonia Drafts, MD    History reviewed. No pertinent family history.   Social History   Tobacco Use  . Smoking status: Never  . Smokeless tobacco: Never  Substance Use Topics  . Alcohol use: No  . Drug use: No    Allergies as of 09/07/2020  . (No Known Allergies)    Review of Systems:    All systems reviewed and negative except where noted in HPI.   Physical Exam:  Vital signs in last 24 hours: Temp:  [97.6 F (36.4 C)-98.2 F (36.8 C)] 98.1 F (36.7 C) (07/12 0729) Pulse Rate:  [70-94] 74 (07/12 0729) Resp:  [12-20] 17 (07/12 0729) BP: (90-138)/(61-84) 136/76 (07/12 0729) SpO2:  [94 %-100 %] 99 % (07/12 0729) Weight:  [81.6 kg-88.6 kg] 88.6 kg (07/11 2033) Last BM Date: 09/07/20 General:   Pleasant, cooperative in NAD Head:  Normocephalic and atraumatic. Eyes:   No icterus.   Conjunctiva pink. PERRLA. Ears:  Normal auditory acuity. Neck:  Supple; no masses or thyroidomegaly Lungs: Respirations even and unlabored. Lungs clear to auscultation bilaterally.   No wheezes, crackles, or rhonchi.   Heart:  Regular rate and rhythm;  Without murmur, clicks, rubs or gallops Abdomen:  Soft, nondistended, nontender. Normal bowel sounds. No appreciable masses or hepatomegaly.  No rebound or guarding.  Neurologic:  Alert and oriented x3;  grossly normal neurologically. Skin:  Intact without significant lesions or rashes. Cervical Nodes:  No significant cervical adenopathy. Psych:  Alert and cooperative. Normal affect.  LAB RESULTS: Recent Labs    09/07/20 1530 09/08/20 0001  WBC 7.2  --   HGB 8.7* 7.8*  HCT 25.9*  --   PLT 269  --    BMET Recent Labs    09/07/20 1530  NA 129*  K 4.8  CL 98  CO2 27  GLUCOSE 111*  BUN 53*  CREATININE 0.76  CALCIUM 8.2*   LFT Recent Labs    09/07/20 1648  PROT 6.5  ALBUMIN 4.0  AST 22  ALT 19  ALKPHOS 51  BILITOT 0.6  BILIDIR <0.1  IBILI NOT CALCULATED   PT/INR Recent Labs    09/07/20 1648  LABPROT 12.9  INR 1.0    STUDIES: DG Lumbar Spine 2-3 Views  Result Date: 09/07/2020 CLINICAL DATA:  Lower back pain EXAM: LUMBAR SPINE - 2-3 VIEW COMPARISON:  August 25, 2020 FINDINGS: Similar mild compression deformity of the superior endplate of the L1 vertebral body. No subluxation. Alignment is normal. Multilevel degenerative changes spine. IMPRESSION: Similar mild compression deformity of the superior endplate of the L1 vertebral body. Electronically Signed   By: Dahlia Bailiff MD   On: 09/07/2020 19:09   CT Head Wo Contrast  Result Date: 09/07/2020 CLINICAL DATA:  Head trauma EXAM: CT HEAD WITHOUT CONTRAST TECHNIQUE: Contiguous axial images were obtained from the base of the skull through the vertex without intravenous contrast. COMPARISON:  August 25, 2020 and September 06, 2011 FINDINGS: Brain: Stable encephalomalacia in the bilateral frontal lobes. Similar mild age related global parenchymal volume loss. Stable burden of chronic ischemic microvascular white matter disease. No evidence of acute large vascular territory infarction, hemorrhage,  hydrocephalus, extra-axial collection or mass lesion/mass effect. Vascular: No hyperdense vessel. Atherosclerotic calcifications of the internal carotid arteries at the skull base. Skull: Soft tissue swelling over the left occiput. Negative for fracture or focal lesion. Sinuses/Orbits: Visualized portions of the paranasal sinuses and mastoid air cells are predominantly clear. Orbits are unremarkable. Other: None IMPRESSION: 1. Soft tissue swelling over the left occiput without underlying fracture or acute intracranial abnormality. 2. Stable encephalomalacia in the bilateral frontal lobes. 3. Stable burden of chronic ischemic microvascular white matter disease and global parenchymal volume loss. Electronically Signed   By: Dahlia Bailiff MD   On: 09/07/2020 18:56   CT Cervical Spine Wo Contrast  Result Date: 09/07/2020 CLINICAL DATA:  Head trauma with loss of consciousness EXAM: CT CERVICAL SPINE WITHOUT CONTRAST TECHNIQUE: Multidetector CT imaging of the cervical spine was performed without intravenous contrast. Multiplanar CT image reconstructions were also generated. COMPARISON:  08/25/2020 FINDINGS: Alignment: Facet joints are aligned without dislocation or traumatic listhesis. Dens and lateral masses are aligned. Skull base and vertebrae: No acute fracture. No primary bone lesion or focal pathologic process. Soft tissues and spinal canal: No prevertebral fluid or swelling. No visible canal hematoma. Disc levels: Degenerative disc disease most pronounced at C5-6. Facet arthropathy most pronounced on the left at C3-4 and C4-5. No appreciable change from prior. Upper chest: Included lung apices are clear. Other: None. IMPRESSION: No acute fracture or traumatic listhesis of the cervical spine. Electronically Signed   By: Davina Poke D.O.   On: 09/07/2020 18:57      Impression / Plan:   Jared Grimes is a 55 y.o. y/o male with a history of recurrent falls seizures had a syncopal episode at his  13 office during the visit fell backwards and hit his head.  In the ER was noted to have a drop in hemoglobin from baseline to 8.7 g.  Black stool noted on rectal exam.  He had been on Plavix and possibly meloxicam.  Elevated BUN/creatinine ratio.  Very likely had an upper GI bleed.  Plan 1.  Stop all NSAIDs including meloxicam. 2.  Continue IV PPI 3.  EGD today 4.  Monitor CBC and transfuse. 5. H pylori stool antigen  I have discussed alternative options, risks & benefits,  which include, but are not limited to, bleeding, infection, perforation,respiratory complication & drug reaction.  The patient agrees with this plan & written consent will be obtained.     Thank you for involving me in the care of this patient.      LOS: 1 day   [name], MD  09/08/2020, 8:15 AM

## 2020-09-08 NOTE — Progress Notes (Signed)
Pt is A&O, VS stable, NSR on the monitor. IV x2 in place. One complaint of back pain, relieved with norco, also asked for his sleep medication. Did not sleep tonight. NS at 100/hr.

## 2020-09-08 NOTE — H&P (Signed)
[name], MD [address], [place], [place], [place], [address] [id number], [place], [place], [place], [address] Phone: [phone]  Fax: [phone]  Primary Care Physician:  Juluis Pitch, MD   Pre-Procedure History & Physical: HPI:  Jared Grimes is a 55 y.o. male is here for an endoscopy    Past Medical History:  Diagnosis Date   Hypertension    Seizures ([place])    Stroke [facility])     Past Surgical History:  Procedure Laterality Date   COLONOSCOPY WITH PROPOFOL N/A 10/22/2015   Procedure: COLONOSCOPY WITH PROPOFOL;  Surgeon: Lollie Sails, MD;  Location: [facility] ENDOSCOPY;  Service: Endoscopy;  Laterality: N/A;   NERVE SURGERY     VAGUS NERVE STIMULATOR INSERTION N/A 02/03/2015   Procedure: VAGAL NERVE STIMULATOR IMPLANT LEFT SIDED;  Surgeon: Consuella Lose, MD;  Location: [facility] NEURO ORS;  Service: Neurosurgery;  Laterality: N/A;    Prior to Admission medications   Medication Sig Start Date End Date Taking? Authorizing Provider  amLODipine (NORVASC) 5 MG tablet Take 5 mg by mouth daily. 08/24/20  Yes [provider]  aspirin EC 81 MG EC tablet Take 1 tablet (81 mg total) by mouth daily. 02/09/16  Yes Theodoro Grist, MD  atenolol (TENORMIN) 50 MG tablet Take 50 mg by mouth daily. 07/28/20  Yes [provider]  atorvastatin (LIPITOR) 40 MG tablet Take 1 tablet (40 mg total) by mouth at bedtime. 02/08/16  Yes Theodoro Grist, MD  betamethasone dipropionate 0.05 % cream Apply 1 application topically 2 (two) times daily as needed. 07/28/20  Yes [provider]  Calcium Carbonate-Vitamin D 600-200 MG-UNIT TABS Take 1 tablet by mouth 2 (two) times daily.   Yes [provider]  carbamazepine (TEGRETOL XR) 200 MG 12 hr tablet Take 3 tablets (600 mg total) by mouth 2 (two) times daily. 800 mg every morning and 600 mg at bedtime 02/20/16  Yes Fritzi Mandes, MD  cholecalciferol (VITAMIN D3) 25 MCG (1000 UNIT) tablet Take 1 tablet by mouth  daily.   Yes [provider]  Cyanocobalamin ([facility] B-12 PO) Take 2 tablets by mouth daily.   Yes [provider]  cyclobenzaprine (FLEXERIL) 10 MG tablet Take 10 mg by mouth 2 (two) times daily as needed. 08/13/20  Yes [provider]  DULoxetine (CYMBALTA) 20 MG capsule Take 20 mg by mouth daily. 07/29/20  Yes [provider]  lamoTRIgine (LAMICTAL) 150 MG tablet Take 300 mg by mouth 2 (two) times daily. 07/29/20  Yes [provider]  losartan (COZAAR) 100 MG tablet Take 1 tablet (100 mg total) by mouth daily. 02/10/16  Yes Theodoro Grist, MD  losartan (COZAAR) 50 MG tablet Take 50 mg by mouth daily. 08/27/20  Yes [provider]  meloxicam (MOBIC) 15 MG tablet Take 15 mg by mouth daily as needed. 07/24/20  Yes [provider]  traZODone (DESYREL) 100 MG tablet Take 200 mg by mouth at bedtime as needed. 07/28/20  Yes [provider]  clopidogrel (PLAVIX) 75 MG tablet Take 1 tablet (75 mg total) by mouth daily. Patient not taking: Reported on 09/07/2020 02/10/16   Theodoro Grist, MD  guaiFENesin-dextromethorphan [facility]) 100-10 MG/5ML syrup Take 5 mLs by mouth every 4 (four) hours as needed for cough (chest congestion). Patient not taking: Reported on 09/07/2020 02/20/16   Fritzi Mandes, MD  HYDROcodone-acetaminophen [facility]) 5-325 MG tablet Take 1 tablet by mouth every 6 (six) hours as needed for moderate pain. Patient not  taking: Reported on 09/07/2020 11/25/18   Duanne Guess, PA-C  lamoTRIgine (LAMICTAL) 200 MG tablet Take 200 mg by mouth 2 (two) times daily. Patient not taking: Reported on 09/07/2020    [provider]  oxyCODONE-acetaminophen (PERCOCET) 5-325 MG tablet Take 1 tablet by mouth every 6 (six) hours as needed for severe pain. Patient not taking: Reported on 09/07/2020 04/07/17   Lavonia Drafts, MD    Allergies as of 09/07/2020   (No Known Allergies)    History reviewed. No pertinent family  history.  Social History   Socioeconomic History   Marital status: Single    Spouse name: Not on file   Number of children: Not on file   Years of education: Not on file   Highest education level: Not on file  Occupational History   Not on file  Tobacco Use   Smoking status: Never   Smokeless tobacco: Never  Substance and Sexual Activity   Alcohol use: No   Drug use: No   Sexual activity: Not on file  Other Topics Concern   Not on file  Social History Narrative   Not on file   Social Determinants of Health   Financial Resource Strain: Not on file  Food Insecurity: Not on file  Transportation Needs: Not on file  Physical Activity: Not on file  Stress: Not on file  Social Connections: Not on file  Intimate Partner Violence: Not on file    Review of Systems: See HPI, otherwise negative ROS  Physical Exam: BP 140/79 (BP Location: Right Arm)   Pulse 73   Temp 98.4 F (36.9 C) (Oral)   Resp 16   Ht 5\' 7"  (1.702 m)   Wt 88.6 kg   SpO2 100%   BMI 30.59 kg/m  General:   Alert,  pleasant and cooperative in NAD Head:  Normocephalic and atraumatic. Neck:  Supple; no masses or thyromegaly. Lungs:  Clear throughout to auscultation, normal respiratory effort.    Heart:  +S1, +S2, Regular rate and rhythm, No edema. Abdomen:  Soft, nontender and nondistended. Normal bowel sounds, without guarding, and without rebound.   Neurologic:  Alert and  oriented x4;  grossly normal neurologically.  Impression/Plan: Jared Grimes is here for an endoscopy  to be performed for  evaluation of melena    Risks, benefits, limitations, and alternatives regarding endoscopy have been reviewed with the patient.  Questions have been answered.  All parties agreeable.   [name], MD  09/08/2020, 1:05 PM [place]

## 2020-09-09 ENCOUNTER — Encounter: Payer: Self-pay | Admitting: Gastroenterology

## 2020-09-09 DIAGNOSIS — R55 Syncope and collapse: Secondary | ICD-10-CM

## 2020-09-09 DIAGNOSIS — D62 Acute posthemorrhagic anemia: Secondary | ICD-10-CM | POA: Diagnosis not present

## 2020-09-09 DIAGNOSIS — E871 Hypo-osmolality and hyponatremia: Secondary | ICD-10-CM

## 2020-09-09 LAB — CBC
HCT: 22.4 % — ABNORMAL LOW (ref 39.0–52.0)
Hemoglobin: 7.5 g/dL — ABNORMAL LOW (ref 13.0–17.0)
MCH: 30.6 pg (ref 26.0–34.0)
MCHC: 33.5 g/dL (ref 30.0–36.0)
MCV: 91.4 fL (ref 80.0–100.0)
Platelets: 292 10*3/uL (ref 150–400)
RBC: 2.45 MIL/uL — ABNORMAL LOW (ref 4.22–5.81)
RDW: 13.6 % (ref 11.5–15.5)
WBC: 8.5 10*3/uL (ref 4.0–10.5)
nRBC: 0 % (ref 0.0–0.2)

## 2020-09-09 LAB — BASIC METABOLIC PANEL
Anion gap: 5 (ref 5–15)
BUN: 15 mg/dL (ref 6–20)
CO2: 29 mmol/L (ref 22–32)
Calcium: 8.3 mg/dL — ABNORMAL LOW (ref 8.9–10.3)
Chloride: 99 mmol/L (ref 98–111)
Creatinine, Ser: 0.67 mg/dL (ref 0.61–1.24)
GFR, Estimated: >60 mL/min (ref >60)
Glucose, Bld: 95 mg/dL (ref 70–99)
Potassium: 4.4 mmol/L (ref 3.5–5.1)
Sodium: 133 mmol/L — ABNORMAL LOW (ref 135–145)

## 2020-09-09 LAB — IRON AND TIBC
Iron: 81 ug/dL (ref 45–182)
Saturation Ratios: 27 % (ref 17.9–39.5)
TIBC: 302 ug/dL (ref 250–450)
UIBC: 221 ug/dL

## 2020-09-09 LAB — MAGNESIUM: Magnesium: 2 mg/dL (ref 1.7–2.4)

## 2020-09-09 LAB — VITAMIN B12: Vitamin B-12: 672 pg/mL (ref 180–914)

## 2020-09-09 LAB — HEMOGLOBIN AND HEMATOCRIT, BLOOD
HCT: 23.1 % — ABNORMAL LOW (ref 39.0–52.0)
Hemoglobin: 7.7 g/dL — ABNORMAL LOW (ref 13.0–17.0)

## 2020-09-09 MED ORDER — HYDROXYZINE HCL 25 MG PO TABS: 25.0000 mg | ORAL_TABLET | Freq: Three times a day (TID) | ORAL | Status: DC | PRN

## 2020-09-09 NOTE — Progress Notes (Signed)
PROGRESS NOTE    Jared Grimes  IDP:[id number] DOB: 02-18-1966 DOA: 09/07/2020 PCP: Juluis Pitch, MD   Chief complaint.  Syncope. Brief Narrative:  Jared Grimes is a 55 y.o. 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 office at the [facility]. Patient was a found to have acute on chronic anemia, received fluids.  EGD was performed, showed a 2 nonbleeding gastric ulcers.  PPI placed.   Assessment & Plan:   Principal Problem:   Acute blood loss anemia Active Problems:   Ambulatory dysfunction   Seizure disorder (HCC)   Chronic hyponatremia   Status post placement of VNS (vagus nerve stimulation) device   Syncope and collapse   Frequent falls   Melena   ABLA (acute blood loss anemia)   History of TIA (transient ischemic attack)  #1.  Acute blood loss anemia secondary to gastric ulcer. Gastric ulcer. Syncope and collapse secondary to GI bleed. Patient condition seem to be improving, he no longer has any dizziness.  However, his hemoglobin has dropped down to 7.5 today.  Iron level was normal, also check a B12 level, pending. I will keep patient for another day due to unstable hemoglobin.  Recheck a CBC tomorrow. Continue p.o. PPI.  2.  Seizure disorder. Status post vagal nerve stimulator. Continue home medicines per  3.  History of TIA. Continue atorvastatin.  #4.  Hyponatremia secondary to SIADH. Recheck a BMP tomorrow.     DVT prophylaxis: SCDs Code Status: full Family Communication:  Disposition Plan:    Status is: Inpatient  Remains inpatient appropriate because:Inpatient level of care appropriate due to severity of illness  Dispo: The patient is from: Home              Anticipated d/c is to: Home              Patient  currently is not medically stable to d/c.   Difficult to place patient No        I/O last 3 completed shifts: In: 2976.7 [P.O.:951; I.V.:1025.7; IV Piggyback:1000] Out: 2675 [Urine:2675] Total I/O In: 50 [P.O.:960] Out: -      Consultants:  GI  Procedures: EGD  Antimicrobials: None  Subjective: Feels well today, no dizziness. Denies any short of breath or cough. No fever chills He did not have bowel movement today, no abdominal pain.  No nausea vomiting. No chest pain or palpitation.  Objective: Vitals:   09/09/20 0352 09/09/20 0411 09/09/20 0736 09/09/20 1300  BP: 137/74 136/73 (!) 145/83 138/76  Pulse: (!) 109 84 84 92  Resp: 18 20 19 19   Temp: 98.1 F (36.7 C) 97.7 F (36.5 C) 97.7 F (36.5 C) 97.9 F (36.6 C)  TempSrc:      SpO2: 99% 96% 97% 97%  Weight:      Height:        Intake/Output Summary (Last 24 hours) at 09/09/2020 1501 Last data filed at 09/09/2020 1300 Gross per 24 hour  Intake 1437 ml  Output 900 ml  Net 537 ml   Filed Weights   09/07/20 1513 09/07/20 2033  Weight: 81.6 kg 88.6 kg    Examination:  General exam: Appears calm and comfortable  Respiratory system: Clear to auscultation. Respiratory effort normal. Cardiovascular system: S1 & S2 heard, RRR. No JVD, murmurs, rubs, gallops or  clicks. No pedal edema. Gastrointestinal system: Abdomen is nondistended, soft and nontender. No organomegaly or masses felt. Normal bowel sounds heard. Central nervous system: Alert and oriented. No focal neurological deficits. Extremities: Symmetric 5 x 5 power. Skin: No rashes, lesions or ulcers Psychiatry: Judgement and insight appear normal. Mood & affect appropriate.     Data Reviewed: I have personally reviewed following labs and imaging studies  CBC: Recent Labs  Lab 09/07/20 1530 09/08/20 0001 09/08/20 0828 09/09/20 0529 09/09/20 1129  WBC 7.2  --   --  8.5  --   HGB 8.7* 7.8* 8.1* 7.5* 7.7*  HCT 25.9*  --   --  22.4* 23.1*   MCV 89.6  --   --  91.4  --   PLT 269  --   --  292  --    Basic Metabolic Panel: Recent Labs  Lab 09/07/20 1530 09/09/20 0529  NA 129* 133*  K 4.8 4.4  CL 98 99  CO2 27 29  GLUCOSE 111* 95  BUN 53* 15  CREATININE 0.76 0.67  CALCIUM 8.2* 8.3*  MG  --  2.0   GFR: Estimated Creatinine Clearance: 109.5 mL/min (by C-G formula based on SCr of 0.67 mg/dL). Liver Function Tests: Recent Labs  Lab 09/07/20 1648  AST 22  ALT 19  ALKPHOS 51  BILITOT 0.6  PROT 6.5  ALBUMIN 4.0   No results for input(s): LIPASE, AMYLASE in the last 168 hours. No results for input(s): AMMONIA in the last 168 hours. Coagulation Profile: Recent Labs  Lab 09/07/20 1648  INR 1.0   Cardiac Enzymes: No results for input(s): CKTOTAL, CKMB, CKMBINDEX, TROPONINI in the last 168 hours. BNP (last 3 results) No results for input(s): PROBNP in the last 8760 hours. HbA1C: No results for input(s): HGBA1C in the last 72 hours. CBG: No results for input(s): GLUCAP in the last 168 hours. Lipid Profile: No results for input(s): CHOL, HDL, LDLCALC, TRIG, CHOLHDL, LDLDIRECT in the last 72 hours. Thyroid Function Tests: No results for input(s): TSH, T4TOTAL, FREET4, T3FREE, THYROIDAB in the last 72 hours. Anemia Panel: Recent Labs    09/09/20 0529  TIBC 302  IRON 81   Sepsis Labs: No results for input(s): PROCALCITON, LATICACIDVEN in the last 168 hours.  Recent Results (from the past 240 hour(s))  Resp Panel by RT-PCR (Flu A&B, Covid) Nasopharyngeal Swab     Status: None   Collection Time: 09/07/20  6:35 PM   Specimen: Nasopharyngeal Swab; Nasopharyngeal(NP) swabs in vial transport medium  Result Value Ref Range Status   SARS Coronavirus 2 by RT PCR NEGATIVE NEGATIVE Final    Comment: (NOTE) SARS-CoV-2 target nucleic acids are NOT DETECTED.  The SARS-CoV-2 RNA is generally detectable in upper respiratory specimens during the acute phase of infection. The lowest concentration of SARS-CoV-2 viral  copies this assay can detect is 138 copies/mL. A negative result does not preclude SARS-Cov-2 infection and should not be used as the sole basis for treatment or other patient management decisions. A negative result may occur with  improper specimen collection/handling, submission of specimen other than nasopharyngeal swab, presence of viral mutation(s) within the areas targeted by this assay, and inadequate number of viral copies(<138 copies/mL). A negative result must be combined with clinical observations, patient history, and epidemiological information. The expected result is Negative.  Fact Sheet for Patients:  EntrepreneurPulse.com.au  Fact Sheet for Healthcare Providers:  IncredibleEmployment.be  This test is no t yet approved or cleared by the [place] FDA  and  has been authorized for detection and/or diagnosis of SARS-CoV-2 by FDA under an Emergency Use Authorization (EUA). This EUA will remain  in effect (meaning this test can be used) for the duration of the COVID-19 declaration under Section 564(b)(1) of the Act, 21 U.S.C.section 360bbb-3(b)(1), unless the authorization is terminated  or revoked sooner.       Influenza A by PCR NEGATIVE NEGATIVE Final   Influenza B by PCR NEGATIVE NEGATIVE Final    Comment: (NOTE) The Xpert Xpress SARS-CoV-2/FLU/RSV plus assay is intended as an aid in the diagnosis of influenza from Nasopharyngeal swab specimens and should not be used as a sole basis for treatment. Nasal washings and aspirates are unacceptable for Xpert Xpress SARS-CoV-2/FLU/RSV testing.  Fact Sheet for Patients: EntrepreneurPulse.com.au  Fact Sheet for Healthcare Providers: IncredibleEmployment.be  This test is not yet approved or cleared by the [place] FDA and has been authorized for detection and/or diagnosis of SARS-CoV-2 by FDA under an Emergency Use Authorization (EUA). This  EUA will remain in effect (meaning this test can be used) for the duration of the COVID-19 declaration under Section 564(b)(1) of the Act, 21 U.S.C. section 360bbb-3(b)(1), unless the authorization is terminated or revoked.  Performed at [facility], [address] [place], [place] [address]          Radiology Studies: DG Lumbar Spine 2-3 Views  Result Date: 09/07/2020 CLINICAL DATA:  Lower back pain EXAM: LUMBAR SPINE - 2-3 VIEW COMPARISON:  August 25, 2020 FINDINGS: Similar mild compression deformity of the superior endplate of the L1 vertebral body. No subluxation. Alignment is normal. Multilevel degenerative changes spine. IMPRESSION: Similar mild compression deformity of the superior endplate of the L1 vertebral body. Electronically Signed   By: Dahlia Bailiff MD   On: 09/07/2020 19:09   CT Head Wo Contrast  Result Date: 09/07/2020 CLINICAL DATA:  Head trauma EXAM: CT HEAD WITHOUT CONTRAST TECHNIQUE: Contiguous axial images were obtained from the base of the skull through the vertex without intravenous contrast. COMPARISON:  August 25, 2020 and September 06, 2011 FINDINGS: Brain: Stable encephalomalacia in the bilateral frontal lobes. Similar mild age related global parenchymal volume loss. Stable burden of chronic ischemic microvascular white matter disease. No evidence of acute large vascular territory infarction, hemorrhage, hydrocephalus, extra-axial collection or mass lesion/mass effect. Vascular: No hyperdense vessel. Atherosclerotic calcifications of the internal carotid arteries at the skull base. Skull: Soft tissue swelling over the left occiput. Negative for fracture or focal lesion. Sinuses/Orbits: Visualized portions of the paranasal sinuses and mastoid air cells are predominantly clear. Orbits are unremarkable. Other: None IMPRESSION: 1. Soft tissue swelling over the left occiput without underlying fracture or acute intracranial abnormality. 2. Stable encephalomalacia in the  bilateral frontal lobes. 3. Stable burden of chronic ischemic microvascular white matter disease and global parenchymal volume loss. Electronically Signed   By: Dahlia Bailiff MD   On: 09/07/2020 18:56   CT Cervical Spine Wo Contrast  Result Date: 09/07/2020 CLINICAL DATA:  Head trauma with loss of consciousness EXAM: CT CERVICAL SPINE WITHOUT CONTRAST TECHNIQUE: Multidetector CT imaging of the cervical spine was performed without intravenous contrast. Multiplanar CT image reconstructions were also generated. COMPARISON:  08/25/2020 FINDINGS: Alignment: Facet joints are aligned without dislocation or traumatic listhesis. Dens and lateral masses are aligned. Skull base and vertebrae: No acute fracture. No primary bone lesion or focal pathologic process. Soft tissues and spinal canal: No prevertebral fluid or swelling. No visible canal hematoma. Disc levels: Degenerative disc disease  most pronounced at C5-6. Facet arthropathy most pronounced on the left at C3-4 and C4-5. No appreciable change from prior. Upper chest: Included lung apices are clear. Other: None. IMPRESSION: No acute fracture or traumatic listhesis of the cervical spine. Electronically Signed   By: Davina Poke D.O.   On: 09/07/2020 18:57        Scheduled Meds:  carbamazepine  800 mg Oral q morning   And   carbamazepine  600 mg Oral QHS   DULoxetine  20 mg Oral Daily   lamoTRIgine  300 mg Oral BID   pantoprazole  40 mg Oral BID   Continuous Infusions:   LOS: 2 days    Time spent: 27 minutes    Sharen Hones, MD [facility]   To contact the attending provider between 7A-7P or the covering provider during after hours 7P-7A, please log into the web site [email or web address] and access using universal [place] password for that web site. If you do not have the password, please call the hospital operator.  09/09/2020, 3:01 PM
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
## 2020-09-09 NOTE — Progress Notes (Addendum)
Pt is A&O, VS stable, NSR on the monitor. IV x2 in place. One complaint of back pain, given tylenol with relief, no other complaints of pain or discomfort. Poss D/C home today

## 2020-09-09 NOTE — Progress Notes (Signed)
Plan of care reviewed with patient. Patient made aware he will be remaining inpatient through tonight with potential for discharge tomorrow. Sister Tammy updated about plan of care for patient by this RN.

## 2020-09-09 NOTE — TOC Initial Note (Signed)
Transition of Care [facility]) - Initial/Assessment Note    Patient Details  Name: Jared Grimes MRN: [id number] Date of Birth: Oct 16, 1965  Transition of Care [facility]) CM/SW Contact:    Kerin Salen, RN Phone Number: 09/09/2020, 9:57 AM  Clinical Narrative:   Spoke with patient who voices living alone in double wide trailor next door to his father who provides transportation to medical visits, shopping and [place] in [place]. Patient voices no use of assisted devices nor home health services. Patient states he is ready for discharge, father will come get him. No TOC needs at this time.                Expected Discharge Plan: Home/Self Care Barriers to Discharge: Continued Medical Work up   Patient Goals and CMS Choice Patient states their goals for this hospitalization and ongoing recovery are:: To return home.   Choice offered to / list presented to : NA  Expected Discharge Plan and Services Expected Discharge Plan: Home/Self Care     Post Acute Care Choice: NA Living arrangements for the past 2 months: Mobile Home                                      Prior Living Arrangements/Services Living arrangements for the past 2 months: Mobile Home Lives with:: Self Patient language and need for interpreter reviewed:: Yes Do you feel safe going back to the place where you live?: Yes      Need for Family Participation in Patient Care: No (Comment) Care giver support system in place?: Yes (comment)   Criminal Activity/Legal Involvement Pertinent to Current Situation/Hospitalization: No - Comment as needed  Activities of Daily Living Home Assistive Devices/Equipment: Shower chair with back ADL Screening (condition at time of admission) Patient's cognitive ability adequate to safely complete daily activities?: Yes Is the patient deaf or have difficulty hearing?: No Does the patient have difficulty seeing, even when wearing glasses/contacts?: No Does the patient have  difficulty concentrating, remembering, or making decisions?: No Patient able to express need for assistance with ADLs?: Yes Does the patient have difficulty dressing or bathing?: No Independently performs ADLs?: Yes (appropriate for developmental age) Does the patient have difficulty walking or climbing stairs?: Yes Weakness of Legs: None Weakness of Arms/Hands: Left  Permission Sought/Granted                  Emotional Assessment Appearance:: Appears stated age Attitude/Demeanor/Rapport: Guarded Affect (typically observed): Guarded Orientation: : Oriented to Self, Oriented to Place, Oriented to  Time, Oriented to Situation Alcohol / Substance Use: Not Applicable Psych Involvement: No (comment)  Admission diagnosis:  Upper GI bleed [K92.2] Syncope, unspecified syncope type [R55] ABLA (acute blood loss anemia) [D62] Patient Active Problem List   Diagnosis Date Noted   Syncope and collapse 09/07/2020   Frequent falls 09/07/2020   Acute blood loss anemia 09/07/2020   Melena 09/07/2020   ABLA (acute blood loss anemia) 09/07/2020   History of TIA (transient ischemic attack) 09/07/2020   Weakness 02/18/2016   Status post placement of VNS (vagus nerve stimulation) device 02/16/2016   TIA (transient ischemic attack) 02/08/2016   Chronic hyponatremia 02/08/2016   Hyperglycemia 02/08/2016   Left leg weakness 02/07/2016   HTN, goal below 140/80 02/07/2016   Ambulatory dysfunction 02/07/2016   Seizure disorder ([place]) 02/07/2016   PCP:  Juluis Pitch, MD Pharmacy:   [place]  [place], [address] [address] Phone: [phone] Fax: [phone]  [facility] [place], [address] [address] Phone: [phone] Fax: [phone]     Social Determinants of Health (SDOH) Interventions    Readmission Risk Interventions No  flowsheet data found.
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
## 2020-09-09 NOTE — Progress Notes (Signed)
Sister Lynelle Smoke updated about patient's plan of care.  Tammy helps care for patient and is fearful of him falling again at home. Interested in patient qualifies for any type of [place] services. This RN will follow up with OT/PT

## 2020-09-10 DIAGNOSIS — Z8673 Personal history of transient ischemic attack (TIA), and cerebral infarction without residual deficits: Secondary | ICD-10-CM | POA: Diagnosis not present

## 2020-09-10 DIAGNOSIS — G40909 Epilepsy, unspecified, not intractable, without status epilepticus: Secondary | ICD-10-CM

## 2020-09-10 DIAGNOSIS — D62 Acute posthemorrhagic anemia: Secondary | ICD-10-CM | POA: Diagnosis not present

## 2020-09-10 LAB — BASIC METABOLIC PANEL
Anion gap: 7 (ref 5–15)
BUN: 12 mg/dL (ref 6–20)
CO2: 27 mmol/L (ref 22–32)
Calcium: 8.6 mg/dL — ABNORMAL LOW (ref 8.9–10.3)
Chloride: 98 mmol/L (ref 98–111)
Creatinine, Ser: 0.62 mg/dL (ref 0.61–1.24)
GFR, Estimated: >60 mL/min (ref >60)
Glucose, Bld: 93 mg/dL (ref 70–99)
Potassium: 3.5 mmol/L (ref 3.5–5.1)
Sodium: 132 mmol/L — ABNORMAL LOW (ref 135–145)

## 2020-09-10 LAB — CBC
HCT: 21.4 % — ABNORMAL LOW (ref 39.0–52.0)
Hemoglobin: 7.2 g/dL — ABNORMAL LOW (ref 13.0–17.0)
MCH: 30.6 pg (ref 26.0–34.0)
MCHC: 33.6 g/dL (ref 30.0–36.0)
MCV: 91.1 fL (ref 80.0–100.0)
Platelets: 309 10*3/uL (ref 150–400)
RBC: 2.35 MIL/uL — ABNORMAL LOW (ref 4.22–5.81)
RDW: 13.7 % (ref 11.5–15.5)
WBC: 8.7 10*3/uL (ref 4.0–10.5)
nRBC: 0 % (ref 0.0–0.2)

## 2020-09-10 LAB — ABO/RH: ABO/RH(D): A POS

## 2020-09-10 LAB — PREPARE RBC (CROSSMATCH)

## 2020-09-10 LAB — MAGNESIUM: Magnesium: 1.9 mg/dL (ref 1.7–2.4)

## 2020-09-10 MED ORDER — SODIUM CHLORIDE 0.9% IV SOLUTION: Freq: Once | INTRAVENOUS | Status: AC

## 2020-09-10 MED ORDER — PANTOPRAZOLE SODIUM 40 MG PO TBEC: 40.0000 mg | DELAYED_RELEASE_TABLET | Freq: Two times a day (BID) | ORAL | 0 refills | Status: DC

## 2020-09-10 MED ORDER — LABETALOL HCL 5 MG/ML IV SOLN: 20.0000 mg | INTRAVENOUS | Status: DC | PRN

## 2020-09-10 NOTE — Discharge Summary (Signed)
Physician Discharge Summary  Patient ID: Jared Grimes MRN: [id number] DOB/AGE: 1965-07-26 55 y.o.  Admit date: 09/07/2020 Discharge date: 09/10/2020  Admission Diagnoses:  Discharge Diagnoses:  Principal Problem:   Acute blood loss anemia Active Problems:   Ambulatory dysfunction   Seizure disorder (HCC)   Chronic hyponatremia   Status post placement of VNS (vagus nerve stimulation) device   Syncope and collapse   Frequent falls   Melena   ABLA (acute blood loss anemia)   History of TIA (transient ischemic attack)   Discharged Condition: good  Hospital Course:  Jared Grimes is a 55 y.o. 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 Patient was a found to have acute on chronic anemia, received fluids.  EGD was performed, showed a 2 nonbleeding gastric ulcers.  PPI placed.  #1.  Acute blood loss anemia secondary to gastric ulcer. Gastric ulcer. Syncope and collapse secondary to GI bleed. Patient is status post EGD.  Hemoglobin has been relatively stable at a lower level, iron and B12 level are normal.  Patient had a normal bowel movement yesterday, no nausea vomiting.  There is no bleeding from EGD, no evidence of continued GI bleed.  At this point, I will transfuse 1 unit PRBC before discharge him.  He will be followed with GI and PCP in the near future. Please repeat a CBC in the next office visit.  2.  Seizure disorder. Status post vagal nerve stimulator. Continue home medicines   3.  History of TIA. Continue atorvastatin.  I will hold aspirin for now until seen by PCP.   #4.  Hyponatremia secondary to SIADH. Stable.    Consults: GI  Significant Diagnostic Studies:  EGD:  Normal examined duodenum. - Normal  esophagus. - Non-bleeding gastric ulcers with a clean ulcer base ([place] Class III). - The examination was otherwise normal. - No specimens collected.   Treatments: EGD and protonix  Discharge Exam: Blood pressure (!) 154/81, pulse 86, temperature 98 F (36.7 C), temperature source Oral, resp. rate 17, height 5\' 7"  (1.702 m), weight 88.6 kg, SpO2 100 %. General appearance: alert and cooperative Resp: clear to auscultation bilaterally Cardio: regular rate and rhythm, S1, S2 normal, no murmur, click, rub or gallop GI: soft, non-tender; bowel sounds normal; no masses,  no organomegaly Extremities: extremities normal, atraumatic, no cyanosis or edema  Disposition: Discharge disposition: 01-Home or Self Care       Discharge Instructions     Diet - low sodium heart healthy   Complete by: As directed    Increase activity slowly   Complete by: As directed       Allergies as of 09/10/2020   No Active Allergies      Medication List     STOP taking these medications    aspirin 81 MG EC tablet   clopidogrel 75 MG tablet Commonly known as: PLAVIX   guaiFENesin-dextromethorphan 100-10 MG/5ML syrup Commonly known as: ROBITUSSIN DM   HYDROcodone-acetaminophen 5-325 MG tablet Commonly known as: Norco   meloxicam 15 MG tablet Commonly known as: MOBIC   oxyCODONE-acetaminophen 5-325 MG tablet Commonly known as: Percocet       TAKE these medications    amLODipine 5 MG tablet Commonly known as: NORVASC Take 5 mg by mouth daily.   atenolol  50 MG tablet Commonly known as: TENORMIN Take 50 mg by mouth daily.   atorvastatin 40 MG tablet Commonly known as: LIPITOR Take 1 tablet (40 mg total) by mouth at bedtime.   betamethasone dipropionate 0.05 % cream Apply 1 application topically 2 (two) times daily as needed.   Calcium Carbonate-Vitamin D 600-200 MG-UNIT Tabs Take 1 tablet by mouth 2 (two) times daily.   carbamazepine 200 MG 12 hr tablet Commonly known  as: TEGRETOL XR Take 3 tablets (600 mg total) by mouth 2 (two) times daily. 800 mg every morning and 600 mg at bedtime   cholecalciferol 25 MCG (1000 UNIT) tablet Commonly known as: VITAMIN D3 Take 1 tablet by mouth daily.   [employer] B-12 PO Take 2 tablets by mouth daily.   cyclobenzaprine 10 MG tablet Commonly known as: FLEXERIL Take 10 mg by mouth 2 (two) times daily as needed.   DULoxetine 20 MG capsule Commonly known as: CYMBALTA Take 20 mg by mouth daily.   lamoTRIgine 150 MG tablet Commonly known as: LAMICTAL Take 300 mg by mouth 2 (two) times daily. What changed: Another medication with the same name was removed. Continue taking this medication, and follow the directions you see here.   losartan 100 MG tablet Commonly known as: COZAAR Take 1 tablet (100 mg total) by mouth daily.   losartan 50 MG tablet Commonly known as: COZAAR Take 50 mg by mouth daily.   pantoprazole 40 MG tablet Commonly known as: PROTONIX Take 1 tablet (40 mg total) by mouth 2 (two) times daily.   traZODone 100 MG tablet Commonly known as: DESYREL Take 200 mg by mouth at bedtime as needed.        Follow-up Information     Juluis Pitch, MD Follow up in 1 week(s).   Specialty: Family Medicine Contact information: [address] [place] [address] [phone]         [name], MD Follow up in 1 week(s).   Specialty: Gastroenterology Contact information: [place] [address] [phone]                32 minutes Signed: Sharen Hones 09/10/2020, 8:45 AM

## 2020-09-10 NOTE — Progress Notes (Signed)
Order Requisition for Advanced Directive, provided the education to patient. Left packet with patient to review.

## 2020-09-10 NOTE — Progress Notes (Addendum)
Sister Lynelle Smoke updated that patient will be discharged after 330 pm pending no reaction to blood transfusion. Transportation with family arranged by Lynelle Smoke. Patient comfortable in recliner at this time with all needs met.  All discharge instructions reviewed with patient at this time and questions answered.

## 2020-09-10 NOTE — TOC Initial Note (Signed)
Transition of Care [facility]) - Initial/Assessment Note    Patient Details  Name: Jared Grimes MRN: [id number] Date of Birth: 01/14/1966  [facility]) CM/SW Contact:    Eileen Stanford, LCSW Phone Number: 09/10/2020, 11:43 AM  Clinical Narrative:     Pt is alert and oriented. Pt states he doesn't think he needs HH and not sure he needs outpatient PT either. It was explained to pt that if he changes his mind he can reach out to his PCP. Pt also declined 3n1 bedside commode stating he already has all the DME he needs at home. NO additional needs. Pt is dc today.              Expected Discharge Plan: Home/Self Care Barriers to Discharge: No Barriers Identified   Patient Goals and CMS Choice Patient states their goals for this hospitalization and ongoing recovery are:: to go home   Choice offered to / list presented to : Patient  Expected Discharge Plan and Services Expected Discharge Plan: Home/Self Care In-house Referral: NA   Post Acute Care Choice: NA Living arrangements for the past 2 months: Single Family Home Expected Discharge Date: 09/10/20                                    Prior Living Arrangements/Services Living arrangements for the past 2 months: Single Family Home Lives with:: Self Patient language and need for interpreter reviewed:: Yes Do you feel safe going back to the place where you live?: Yes      Need for Family Participation in Patient Care: Yes (Comment) Care giver support system in place?: Yes (comment)   Criminal Activity/Legal Involvement Pertinent to Current Situation/Hospitalization: No - Comment as needed  Activities of Daily Living Home Assistive Devices/Equipment: Shower chair with back ADL Screening (condition at time of admission) Patient's cognitive ability adequate to safely complete daily activities?: Yes Is the patient deaf or have difficulty hearing?: No Does the patient have difficulty seeing, even when wearing  glasses/contacts?: No Does the patient have difficulty concentrating, remembering, or making decisions?: No Patient able to express need for assistance with ADLs?: Yes Does the patient have difficulty dressing or bathing?: No Independently performs ADLs?: Yes (appropriate for developmental age) Does the patient have difficulty walking or climbing stairs?: Yes Weakness of Legs: None Weakness of Arms/Hands: Left  Permission Sought/Granted Permission sought to share information with : Family Supports    Share Information with NAME: Di Kindle     Permission granted to share info w Relationship: significant other     Emotional Assessment Appearance:: Appears stated age Attitude/Demeanor/Rapport: Engaged Affect (typically observed): Accepting Orientation: : Oriented to Self, Oriented to Place, Oriented to  Time, Oriented to Situation Alcohol / Substance Use: Not Applicable Psych Involvement: No (comment)  Admission diagnosis:  Upper GI bleed [K92.2] Syncope, unspecified syncope type [R55] ABLA (acute blood loss anemia) [D62] Patient Active Problem List   Diagnosis Date Noted   Syncope and collapse 09/07/2020   Frequent falls 09/07/2020   Acute blood loss anemia 09/07/2020   Melena 09/07/2020   ABLA (acute blood loss anemia) 09/07/2020   History of TIA (transient ischemic attack) 09/07/2020   Weakness 02/18/2016   Status post placement of VNS (vagus nerve stimulation) device 02/16/2016   TIA (transient ischemic attack) 02/08/2016   Chronic hyponatremia 02/08/2016   Hyperglycemia 02/08/2016   Left leg weakness 02/07/2016   HTN, goal  below 140/80 02/07/2016   Ambulatory dysfunction 02/07/2016   Seizure disorder ([place]) 02/07/2016   PCP:  Juluis Pitch, MD Pharmacy:   [place], [address] [place] [address] Phone: [phone] Fax: [place], [place] [address] [place] [address] Phone: [phone] Fax: [phone]     Social Determinants of Health (SDOH) Interventions    Readmission Risk Interventions No flowsheet data found.

## 2020-09-10 NOTE — Care Management Important Message (Signed)
Important Message  Patient Details  Name: Jared Grimes MRN: [id number] Date of Birth: 02/21/1966   Medicare Important Message Given:  Yes     Dannette Barbara 09/10/2020, 1:05 PM

## 2020-09-11 LAB — TYPE AND SCREEN
ABO/RH(D): A POS
Antibody Screen: NEGATIVE
Unit division: 0

## 2020-09-11 LAB — BPAM RBC
Blood Product Expiration Date: 202208042359
ISSUE DATE / TIME: 202207141125
Unit Type and Rh: 6200

## 2020-09-14 ENCOUNTER — Telehealth: Payer: Self-pay | Admitting: Gastroenterology

## 2020-09-14 NOTE — Telephone Encounter (Signed)
Called patient back and I told him that we were able to double book Dr. Vicente Males on 10/20/2020 at 1:45 PM and he accepted.

## 2020-09-14 NOTE — Telephone Encounter (Signed)
(  Hospital f/u)Patient wants to know if he can be seen sooner than 11/17/2020 and can he eat a Banana. Clinical staff will follow up with patient.

## 2020-10-13 ENCOUNTER — Other Ambulatory Visit: Payer: Self-pay

## 2020-10-13 ENCOUNTER — Telehealth: Payer: Self-pay

## 2020-10-13 DIAGNOSIS — K921 Melena: Secondary | ICD-10-CM

## 2020-10-13 NOTE — Telephone Encounter (Signed)
Called patient to let him know that he would be needing an EGD soon per Dr. Vicente Males to make sure that his prescribed medication Prilosec had helped him. Patient agreed and he will have it done on 10/29/2020.

## 2020-10-20 ENCOUNTER — Other Ambulatory Visit: Payer: Self-pay

## 2020-10-20 ENCOUNTER — Ambulatory Visit (INDEPENDENT_AMBULATORY_CARE_PROVIDER_SITE_OTHER): Payer: Medicare Other | Admitting: Gastroenterology

## 2020-10-20 ENCOUNTER — Encounter: Payer: Self-pay | Admitting: Gastroenterology

## 2020-10-20 VITALS — BP 158/83 | HR 88 | Temp 97.8°F | Ht 67.0 in | Wt 197.5 lb

## 2020-10-20 DIAGNOSIS — Z791 Long term (current) use of non-steroidal anti-inflammatories (NSAID): Secondary | ICD-10-CM

## 2020-10-20 DIAGNOSIS — K259 Gastric ulcer, unspecified as acute or chronic, without hemorrhage or perforation: Secondary | ICD-10-CM | POA: Diagnosis not present

## 2020-10-20 DIAGNOSIS — Z8719 Personal history of other diseases of the digestive system: Secondary | ICD-10-CM | POA: Diagnosis not present

## 2020-10-20 NOTE — Progress Notes (Signed)
[name] MD, MRCP(U.K) [address]  [place]  [place], [place] [address]  Main: [phone]  Fax: [phone]   Primary Care Physician: Juluis Pitch, MD  Primary Gastroenterologist:  Dr. [name]   Chief complaint: Hospital follow-up   HPI: Jared Grimes is a 55 y.o. male   Summary of history :  I was consulted to see him on 09/08/2020 when he presented to the hospital with a GI bleed.  He was on Plavix for TIAs and had a syncopal event at his primary care physician's office and his hemoglobin was down at 8.7 g from a prior of 13.8 g a few weeks prior.  He did have melena.  Elevated BUN/creatinine ratio on admission.  Advised to stop all his NSAIDs including meloxicam which she was on.  I performed an EGD on 09/08/2020 and noted that he had clean-based ulcers in the gastric antrum largest was 10 mm in size.  Commence him on Prilosec 40 mg twice daily for 3 months and plan was to repeat endoscopy in 6 weeks to assess response to therapy.  B12 and iron studies were normal.  Interval history 09/08/2020-10/20/2020  Doing well since discharge.  No further bleeding.  Brown stools.  Stop meloxicam.  Having back pain.  Taking his PPI twice a day.  Current Outpatient Medications  Medication Sig Dispense Refill   amLODipine (NORVASC) 5 MG tablet Take 5 mg by mouth daily.     atenolol (TENORMIN) 50 MG tablet Take 50 mg by mouth daily.     atorvastatin (LIPITOR) 40 MG tablet Take 1 tablet (40 mg total) by mouth at bedtime. 30 tablet 5   betamethasone dipropionate 0.05 % cream Apply 1 application topically 2 (two) times daily as needed.     Calcium Carbonate-Vitamin D 600-200 MG-UNIT TABS Take 1 tablet by mouth 2 (two) times daily.     carbamazepine (TEGRETOL XR) 200 MG 12 hr tablet Take 3 tablets (600 mg total) by mouth 2 (two) times daily. 800 mg every morning and 600 mg at bedtime 60 tablet 0   cholecalciferol (VITAMIN D3) 25 MCG (1000 UNIT) tablet Take 1 tablet by mouth  daily.     Cyanocobalamin (CVS B-12 PO) Take 2 tablets by mouth daily.     cyclobenzaprine (FLEXERIL) 10 MG tablet Take 10 mg by mouth 2 (two) times daily as needed.     DULoxetine (CYMBALTA) 20 MG capsule Take 20 mg by mouth daily.     lamoTRIgine (LAMICTAL) 150 MG tablet Take 300 mg by mouth 2 (two) times daily.     losartan (COZAAR) 100 MG tablet Take 1 tablet (100 mg total) by mouth daily. 30 tablet 6   losartan (COZAAR) 50 MG tablet Take 50 mg by mouth daily.     pantoprazole (PROTONIX) 40 MG tablet Take 1 tablet (40 mg total) by mouth 2 (two) times daily. 60 tablet 0   traZODone (DESYREL) 100 MG tablet Take 200 mg by mouth at bedtime as needed.     No current facility-administered medications for this visit.    Allergies as of 10/20/2020   (No Active Allergies)    ROS:  General: Negative for anorexia, weight loss, fever, chills, fatigue, weakness. ENT: Negative for hoarseness, difficulty swallowing , nasal congestion. CV: Negative for chest pain, angina, palpitations, dyspnea on exertion, peripheral edema.  Respiratory: Negative for dyspnea at rest, dyspnea on exertion, cough, sputum, wheezing.  GI: See history of present illness. GU:  Negative for dysuria,  hematuria, urinary incontinence, urinary frequency, nocturnal urination.  Endo: Negative for unusual weight change.    Physical Examination:   BP (!) 158/83   Pulse 88   Temp 97.8 F (36.6 C) (Oral)   Ht '5\' 7"'$  (1.702 m)   Wt 197 lb 8 oz (89.6 kg)   BMI 30.93 kg/m   General: Well-nourished, well-developed in no acute distress.  Eyes: No icterus. Conjunctivae pink. Mouth: Oropharyngeal mucosa moist and pink , no lesions erythema or exudate. Abdomen: Bowel sounds are normal, nontender, nondistended, no hepatosplenomegaly or masses, no abdominal bruits or hernia , no rebound or guarding.   Extremities: No lower extremity edema. No clubbing or deformities. Neuro: Alert and oriented x 3.  Grossly intact. Skin: Warm and  dry, no jaundice.   Psych: Alert and cooperative, normal mood and affect.   Imaging Studies: No results found.  Assessment and Plan:   Jared Grimes is a 55 y.o. y/o male here today as a hospital follow-up.  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  No further bleeding since discharge.  Plan 1.  Repeat EGD to confirm healing of gastric ulcers 2.  H. pylori breath test, CBC 3.  Stay off all NSAIDs.   I have discussed alternative options, risks & benefits,  which include, but are not limited to, bleeding, infection, perforation,respiratory complication & drug reaction.  The patient agrees with this plan & written consent will be obtained.     Dr [name]  MD,MRCP [facility]) Follow up in 3 months

## 2020-10-21 LAB — CBC WITH DIFFERENTIAL/PLATELET
Basophils Absolute: 0 10*3/uL (ref 0.0–0.2)
Basos: 1 %
EOS (ABSOLUTE): 0.1 10*3/uL (ref 0.0–0.4)
Eos: 3 %
Hematocrit: 34.5 % — ABNORMAL LOW (ref 37.5–51.0)
Hemoglobin: 11 g/dL — ABNORMAL LOW (ref 13.0–17.7)
Immature Grans (Abs): 0 10*3/uL (ref 0.0–0.1)
Immature Granulocytes: 0 %
Lymphocytes Absolute: 1.8 10*3/uL (ref 0.7–3.1)
Lymphs: 41 %
MCH: 27.2 pg (ref 26.6–33.0)
MCHC: 31.9 g/dL (ref 31.5–35.7)
MCV: 85 fL (ref 79–97)
Monocytes Absolute: 0.6 10*3/uL (ref 0.1–0.9)
Monocytes: 13 %
Neutrophils Absolute: 1.9 10*3/uL (ref 1.4–7.0)
Neutrophils: 42 %
Platelets: 377 10*3/uL (ref 150–450)
RBC: 4.04 x10E6/uL — ABNORMAL LOW (ref 4.14–5.80)
RDW: 13.5 % (ref 11.6–15.4)
WBC: 4.4 10*3/uL (ref 3.4–10.8)

## 2020-10-21 LAB — H. PYLORI BREATH TEST: H pylori Breath Test: NEGATIVE

## 2020-10-29 ENCOUNTER — Encounter
Admission: RE | Disposition: A | Discharge status: Home / Self Care | Payer: Self-pay | Source: Ambulatory Visit | Attending: Gastroenterology

## 2020-10-29 ENCOUNTER — Ambulatory Visit: Payer: Medicare Other | Admitting: Registered Nurse

## 2020-10-29 ENCOUNTER — Ambulatory Visit
Admission: RE | Admit: 2020-10-29 | Discharge: 2020-10-29 | Disposition: A | Discharge status: Home / Self Care | Payer: Medicare Other | Source: Ambulatory Visit | Attending: Gastroenterology | Admitting: Gastroenterology

## 2020-10-29 DIAGNOSIS — Z8711 Personal history of peptic ulcer disease: Secondary | ICD-10-CM | POA: Diagnosis not present

## 2020-10-29 DIAGNOSIS — Z79899 Other long term (current) drug therapy: Secondary | ICD-10-CM | POA: Insufficient documentation

## 2020-10-29 DIAGNOSIS — K2960 Other gastritis without bleeding: Secondary | ICD-10-CM | POA: Diagnosis not present

## 2020-10-29 DIAGNOSIS — Z8673 Personal history of transient ischemic attack (TIA), and cerebral infarction without residual deficits: Secondary | ICD-10-CM | POA: Insufficient documentation

## 2020-10-29 DIAGNOSIS — I10 Essential (primary) hypertension: Secondary | ICD-10-CM | POA: Diagnosis not present

## 2020-10-29 DIAGNOSIS — Z8669 Personal history of other diseases of the nervous system and sense organs: Secondary | ICD-10-CM | POA: Diagnosis not present

## 2020-10-29 DIAGNOSIS — K253 Acute gastric ulcer without hemorrhage or perforation: Secondary | ICD-10-CM | POA: Insufficient documentation

## 2020-10-29 DIAGNOSIS — K3189 Other diseases of stomach and duodenum: Secondary | ICD-10-CM | POA: Diagnosis not present

## 2020-10-29 DIAGNOSIS — K921 Melena: Secondary | ICD-10-CM

## 2020-10-29 HISTORY — PX: ESOPHAGOGASTRODUODENOSCOPY (EGD) WITH PROPOFOL: SHX5813

## 2020-10-29 SURGERY — ESOPHAGOGASTRODUODENOSCOPY (EGD) WITH PROPOFOL
Anesthesia: General

## 2020-10-29 MED ORDER — PROPOFOL 500 MG/50ML IV EMUL
INTRAVENOUS | Status: DC | PRN
  Administered 2020-10-29: 150 ug/kg/min via INTRAVENOUS

## 2020-10-29 MED ORDER — METOPROLOL TARTRATE 5 MG/5ML IV SOLN
INTRAVENOUS | Status: DC | PRN
  Administered 2020-10-29: 3 mg via INTRAVENOUS
  Administered 2020-10-29: 2 mg via INTRAVENOUS

## 2020-10-29 MED ORDER — PROPOFOL 10 MG/ML IV BOLUS
INTRAVENOUS | Status: DC | PRN
  Administered 2020-10-29: 90 mg via INTRAVENOUS

## 2020-10-29 MED ORDER — LIDOCAINE HCL (CARDIAC) PF 100 MG/5ML IV SOSY
PREFILLED_SYRINGE | INTRAVENOUS | Status: DC | PRN
  Administered 2020-10-29: 100 mg via INTRAVENOUS

## 2020-10-29 MED ORDER — SODIUM CHLORIDE 0.9 % IV SOLN: INTRAVENOUS | Status: DC

## 2020-10-29 NOTE — Anesthesia Preprocedure Evaluation (Addendum)
Anesthesia Evaluation  Patient identified by MRN, date of birth, ID band Patient awake    Reviewed: Allergy & Precautions, NPO status , Patient's Chart, lab work & pertinent test results  History of Anesthesia Complications Negative for: history of anesthetic complications  Airway Mallampati: III  TM Distance: <3 FB Neck ROM: full    Dental  (+) Chipped, Poor Dentition, Missing, Partial Upper, Caps   Pulmonary neg pulmonary ROS, neg shortness of breath,    Pulmonary exam normal        Cardiovascular Exercise Tolerance: Good hypertension, (-) anginaNormal cardiovascular exam     Neuro/Psych Seizures -,  TIACVA negative psych ROS   GI/Hepatic Neg liver ROS, neg GERD  ,melena    Endo/Other  negative endocrine ROS  Renal/GU negative Renal ROS  negative genitourinary   Musculoskeletal   Abdominal Normal abdominal exam  (+)   Peds  Hematology negative hematology ROS (+) anemia ,   Anesthesia Other Findings Status post placement of VNS (vagus nerve stimulation) device  Past Medical History: No date: Hypertension No date: Seizures ([place]) No date: Stroke [facility])  Past Surgical History: 10/22/2015: COLONOSCOPY WITH PROPOFOL; N/A     Comment:  Procedure: COLONOSCOPY WITH PROPOFOL;  Surgeon: Lollie Sails, MD;  Location: [facility] ENDOSCOPY;  Service:               Endoscopy;  Laterality: N/A; No date: NERVE SURGERY 02/03/2015: VAGUS NERVE STIMULATOR INSERTION; N/A     Comment:  Procedure: VAGAL NERVE STIMULATOR IMPLANT LEFT SIDED;                Surgeon: Consuella Lose, MD;  Location: [place] NEURO ORS;               Service: Neurosurgery;  Laterality: N/A;  BMI    Body Mass Index: 30.59 kg/m      Reproductive/Obstetrics negative OB ROS                            Anesthesia Physical  Anesthesia Plan  ASA: 3  Anesthesia Plan: General   Post-op Pain Management:    Induction:  Intravenous  PONV Risk Score and Plan: Propofol infusion and TIVA  Airway Management Planned: Natural Airway and Nasal Cannula  Additional Equipment:   Intra-op Plan:   Post-operative Plan:   Informed Consent: I have reviewed the patients History and Physical, chart, labs and discussed the procedure including the risks, benefits and alternatives for the proposed anesthesia with the patient or authorized representative who has indicated his/her understanding and acceptance.     Dental Advisory Given  Plan Discussed with: Anesthesiologist, CRNA and Surgeon  Anesthesia Plan Comments: (Patient consented for risks of anesthesia including but not limited to:  - adverse reactions to medications - risk of airway placement if required - damage to eyes, teeth, lips or other oral mucosa - nerve damage due to positioning  - sore throat or hoarseness - Damage to heart, brain, nerves, lungs, other parts of body or loss of life  Patient voiced understanding.)        Anesthesia Quick Evaluation

## 2020-10-29 NOTE — Op Note (Signed)
[facility] Patient Name: Jared Grimes Procedure Date: 10/29/2020 8:23 AM MRN: [id number] Account #: [id number] Date of Birth: 1965/07/15 Admit Type: Outpatient Age: 55 Room: [facility] ENDO ROOM 3 Gender: Male Note Status: Finalized Procedure:             Upper GI endoscopy Indications:           Follow-up of acute gastric ulcer with hemorrhage Providers:             [name] MD, MD Referring MD:          Youlanda Roys. Lovie Macadamia, MD (Referring MD) Medicines:             Monitored Anesthesia Care Complications:         No immediate complications. Procedure:             Pre-Anesthesia Assessment:                        - Prior to the procedure, a History and Physical was                         performed, and patient medications, allergies and                         sensitivities were reviewed. The patient's tolerance                         of previous anesthesia was reviewed.                        - The risks and benefits of the procedure and the                         sedation options and risks were discussed with the                         patient. All questions were answered and informed                         consent was obtained.                        - ASA Grade Assessment: II - A patient with mild                         systemic disease.                        After obtaining informed consent, the endoscope was                         passed under direct vision. Throughout the procedure,                         the patient's blood pressure, pulse, and oxygen                         saturations were monitored continuously. The Endoscope                         was  introduced through the mouth, and advanced to the                         third part of duodenum. The upper GI endoscopy was                         accomplished with ease. The patient tolerated the                         procedure well. Findings:      The esophagus was normal.       The examined duodenum was normal.      The cardia and gastric fundus were normal on retroflexion.      Localized moderately erythematous mucosa without bleeding was found in       the gastric antrum. Biopsies were taken with a cold forceps for       histology. Impression:            - Normal esophagus.                        - Normal examined duodenum.                        - Erythematous mucosa in the antrum. Biopsied. Recommendation:        - Await pathology results.                        - Discharge patient to home (with escort).                        - Resume previous diet.                        - Continue present medications.                        - Return to my office PRN. Procedure Code(s):     --- Professional ---                        [phone], Esophagogastroduodenoscopy, flexible,                         transoral; with biopsy, single or multiple Diagnosis Code(s):     --- Professional ---                        K31.89, Other diseases of stomach and duodenum                        K25.0, Acute gastric ulcer with hemorrhage CPT copyright 2019 American Medical Association. All rights reserved. The codes documented in this report are preliminary and upon coder review may  be revised to meet current compliance requirements. [name], MD [name] MD, MD 10/29/2020 8:43:01 AM This report has been signed electronically. Number of Addenda: 0 Note Initiated On: 10/29/2020 8:23 AM Estimated Blood Loss:  Estimated blood loss: none.      [facility]

## 2020-10-29 NOTE — H&P (Signed)
Jared Bellows, MD [address] [place], [place], [place], [address] [address], [place], [place], [place], [address] Phone: [phone]  Fax: [phone]  Primary Care Physician:  Jared Pitch, MD   Pre-Procedure History & Physical: HPI:  Jared Grimes is a 55 y.o. male is here for an endoscopy    Past Medical History:  Diagnosis Date   Hypertension    Seizures ([place])    Stroke [facility])     Past Surgical History:  Procedure Laterality Date   COLONOSCOPY WITH PROPOFOL N/A 10/22/2015   Procedure: COLONOSCOPY WITH PROPOFOL;  Surgeon: Lollie Sails, MD;  Location: [facility] ENDOSCOPY;  Service: Endoscopy;  Laterality: N/A;   ESOPHAGOGASTRODUODENOSCOPY (EGD) WITH PROPOFOL N/A 09/08/2020   Procedure: ESOPHAGOGASTRODUODENOSCOPY (EGD) WITH PROPOFOL;  Surgeon: Jared Bellows, MD;  Location: [facility] ENDOSCOPY;  Service: Gastroenterology;  Laterality: N/A;   NERVE SURGERY     VAGUS NERVE STIMULATOR INSERTION N/A 02/03/2015   Procedure: VAGAL NERVE STIMULATOR IMPLANT LEFT SIDED;  Surgeon: Consuella Lose, MD;  Location: [place] NEURO ORS;  Service: Neurosurgery;  Laterality: N/A;    Prior to Admission medications   Medication Sig Start Date End Date Taking? Authorizing Provider  amLODipine (NORVASC) 5 MG tablet Take 5 mg by mouth daily. 08/24/20  Yes [provider]  atenolol (TENORMIN) 50 MG tablet Take 50 mg by mouth daily. 07/28/20  Yes [provider]  atorvastatin (LIPITOR) 40 MG tablet Take 1 tablet (40 mg total) by mouth at bedtime. 02/08/16  Yes Theodoro Grist, MD  Calcium Carbonate-Vitamin D 600-200 MG-UNIT TABS Take 1 tablet by mouth 2 (two) times daily.   Yes [provider]  carbamazepine (TEGRETOL XR) 200 MG 12 hr tablet Take 3 tablets (600 mg total) by mouth 2 (two) times daily. 800 mg every morning and 600 mg at bedtime 02/20/16  Yes Fritzi Mandes, MD  cholecalciferol (VITAMIN D3) 25 MCG (1000 UNIT) tablet Take 1 tablet by mouth daily.   Yes [provider]  Cyanocobalamin (CVS B-12 PO) Take 2 tablets by mouth daily.   Yes [provider]  DULoxetine (CYMBALTA) 20 MG capsule Take 20 mg by mouth daily. 07/29/20  Yes [provider]  lamoTRIgine (LAMICTAL) 150 MG tablet Take 300 mg by mouth 2 (two) times daily. 07/29/20  Yes [provider]  losartan (COZAAR) 100 MG tablet Take 1 tablet (100 mg total) by mouth daily. 02/10/16  Yes Theodoro Grist, MD  losartan (COZAAR) 50 MG tablet Take 50 mg by mouth daily. 08/27/20  Yes [provider]  traZODone (DESYREL) 100 MG tablet Take 200 mg by mouth at bedtime as needed. 07/28/20  Yes [provider]  betamethasone dipropionate 0.05 % cream Apply 1 application topically 2 (two) times daily as needed. 07/28/20   [provider]  cyclobenzaprine (FLEXERIL) 10 MG tablet Take 10 mg by mouth 2 (two) times daily as needed. 08/13/20   [provider]  pantoprazole (PROTONIX) 40 MG tablet Take 1 tablet (40 mg total) by mouth 2 (two) times daily. 09/10/20   Sharen Hones, MD    Allergies as of 10/13/2020   (No Active Allergies)    No family history on file.  Social History   Socioeconomic History   Marital status: Single    Spouse name: Not on file   Number of children: Not on file   Years of education: Not on file   Highest education level: Not on file  Occupational History   Not on  file  Tobacco Use   Smoking status: Never   Smokeless tobacco: Never  Substance and Sexual Activity   Alcohol use: No   Drug use: No   Sexual activity: Not on file  Other Topics Concern   Not on file  Social History Narrative   Not on file   Social Determinants of Health   Financial Resource Strain: Not on file  Food Insecurity: Not on file  Transportation Needs: Not on file  Physical Activity: Not on file  Stress: Not on file  Social Connections: Not on file  Intimate Partner Violence: Not on file    Review of Systems: See HPI, otherwise  negative ROS  Physical Exam: BP (!) 150/96   Pulse 81   Temp (!) 97.3 F (36.3 C) (Temporal)   Resp 20   Ht '5\' 8"'$  (1.727 m)   Wt 90.7 kg   SpO2 100%   BMI 30.41 kg/m  General:   Alert,  pleasant and cooperative in NAD Head:  Normocephalic and atraumatic. Neck:  Supple; no masses or thyromegaly. Lungs:  Clear throughout to auscultation, normal respiratory effort.    Heart:  +S1, +S2, Regular rate and rhythm, No edema. Abdomen:  Soft, nontender and nondistended. Normal bowel sounds, without guarding, and without rebound.   Neurologic:  Alert and  oriented x4;  grossly normal neurologically.  Impression/Plan: Jared Grimes is here for an endoscopy  to be performed for  evaluation of gastric ulcer    Risks, benefits, limitations, and alternatives regarding endoscopy have been reviewed with the patient.  Questions have been answered.  All parties agreeable.   Jared Bellows, MD  10/29/2020, 8:27 AM

## 2020-10-29 NOTE — Anesthesia Postprocedure Evaluation (Signed)
Anesthesia Post Note  Patient: Jared Grimes  Procedure(s) Performed: ESOPHAGOGASTRODUODENOSCOPY (EGD) WITH PROPOFOL  Patient location during evaluation: PACU Anesthesia Type: General Level of consciousness: awake and alert Pain management: pain level controlled Vital Signs Assessment: post-procedure vital signs reviewed and stable Respiratory status: spontaneous breathing, nonlabored ventilation and respiratory function stable Cardiovascular status: blood pressure returned to baseline and stable Postop Assessment: no apparent nausea or vomiting Anesthetic complications: no Comments: BP within 20% of baseline. Pt instructed to follow up with PCP regarding elevated BP. He will resume his medications at home today.    No notable events documented.   Last Vitals:  Vitals:   10/29/20 0855 10/29/20 0909  BP: (!) 158/93 (!) 174/96  Pulse: 68 68  Resp: 14 15  Temp:    SpO2: 98% 99%    Last Pain:  Vitals:   10/29/20 0909  TempSrc:   PainSc: 0-No pain                 [place] Ouch

## 2020-10-29 NOTE — Transfer of Care (Signed)
Immediate Anesthesia Transfer of Care Note  Patient: FLEMMING EGLESTON  Procedure(s) Performed: ESOPHAGOGASTRODUODENOSCOPY (EGD) WITH PROPOFOL  Patient Location: Endoscopy Unit  Anesthesia Type:General  Level of Consciousness: drowsy  Airway & Oxygen Therapy: Patient Spontanous Breathing  Post-op Assessment: Report given to RN and Post -op Vital signs reviewed and stable  Post vital signs: Reviewed and stable  Last Vitals:  Vitals Value Taken Time  BP 161/95 10/29/20 0845  Temp    Pulse 70 10/29/20 0845  Resp 13 10/29/20 0845  SpO2 98 % 10/29/20 0845    Last Pain:  Vitals:   10/29/20 0845  TempSrc:   PainSc: Asleep         Complications: No notable events documented.

## 2020-10-30 ENCOUNTER — Encounter: Payer: Self-pay | Admitting: Gastroenterology

## 2020-10-30 LAB — SURGICAL PATHOLOGY

## 2020-11-10 ENCOUNTER — Encounter: Payer: Self-pay | Admitting: Gastroenterology

## 2020-11-17 ENCOUNTER — Ambulatory Visit: Payer: Medicare Other | Admitting: Gastroenterology

## 2022-01-06 ENCOUNTER — Telehealth: Payer: Self-pay

## 2022-01-06 ENCOUNTER — Other Ambulatory Visit: Payer: Self-pay

## 2022-01-06 DIAGNOSIS — Z86010 Personal history of colonic polyps: Secondary | ICD-10-CM

## 2022-01-06 MED ORDER — NA SULFATE-K SULFATE-MG SULF 17.5-3.13-1.6 GM/177ML PO SOLN: 1.0000 | Freq: Once | ORAL | 0 refills | Status: AC

## 2022-01-06 NOTE — Telephone Encounter (Signed)
Gastroenterology Pre-Procedure Review  Request Date: 01/19/22 Requesting Physician: Dr. Vicente Males  PATIENT REVIEW QUESTIONS: The patient responded to the following health history questions as indicated:    1. Are you having any GI issues? no 2. Do you have a personal history of Polyps? yes (2017 colon polyps noted by Dr. Gustavo Lah ) 3. Do you have a family history of Colon Cancer or Polyps? no 4. Diabetes Mellitus? no 5. Joint replacements in the past 12 months?no 6. Major health problems in the past 3 months?no 7. Any artificial heart valves, MVP, or defibrillator?no    MEDICATIONS & ALLERGIES:    Patient reports the following regarding taking any anticoagulation/antiplatelet therapy:   Plavix, Coumadin, Eliquis, Xarelto, Lovenox, Pradaxa, Brilinta, or Effient? no Aspirin? no  Patient confirms/reports the following medications:  Current Outpatient Medications  Medication Sig Dispense Refill   amLODipine (NORVASC) 5 MG tablet Take 5 mg by mouth daily.     atenolol (TENORMIN) 50 MG tablet Take 50 mg by mouth daily.     atorvastatin (LIPITOR) 40 MG tablet Take 1 tablet (40 mg total) by mouth at bedtime. 30 tablet 5   betamethasone dipropionate 0.05 % cream Apply 1 application topically 2 (two) times daily as needed.     Calcium Carbonate-Vitamin D 600-200 MG-UNIT TABS Take 1 tablet by mouth 2 (two) times daily.     carbamazepine (TEGRETOL XR) 200 MG 12 hr tablet Take 3 tablets (600 mg total) by mouth 2 (two) times daily. 800 mg every morning and 600 mg at bedtime 60 tablet 0   cholecalciferol (VITAMIN D3) 25 MCG (1000 UNIT) tablet Take 1 tablet by mouth daily.     Cyanocobalamin (CVS B-12 PO) Take 2 tablets by mouth daily.     cyclobenzaprine (FLEXERIL) 10 MG tablet Take 10 mg by mouth 2 (two) times daily as needed.     DULoxetine (CYMBALTA) 20 MG capsule Take 20 mg by mouth daily.     lamoTRIgine (LAMICTAL) 150 MG tablet Take 300 mg by mouth 2 (two) times daily.     losartan (COZAAR) 100  MG tablet Take 1 tablet (100 mg total) by mouth daily. 30 tablet 6   losartan (COZAAR) 50 MG tablet Take 50 mg by mouth daily.     pantoprazole (PROTONIX) 40 MG tablet Take 1 tablet (40 mg total) by mouth 2 (two) times daily. 60 tablet 0   traZODone (DESYREL) 100 MG tablet Take 200 mg by mouth at bedtime as needed.     No current facility-administered medications for this visit.    Patient confirms/reports the following allergies:  No Known Allergies  No orders of the defined types were placed in this encounter.   AUTHORIZATION INFORMATION Primary Insurance: 1D#: Group #:  Secondary Insurance: 1D#: Group #:  SCHEDULE INFORMATION: Date:  Time: Location:

## 2022-01-19 ENCOUNTER — Ambulatory Visit: Payer: Medicare Other | Admitting: Anesthesiology

## 2022-01-19 ENCOUNTER — Encounter: Payer: Self-pay | Admitting: Gastroenterology

## 2022-01-19 ENCOUNTER — Other Ambulatory Visit: Payer: Self-pay

## 2022-01-19 ENCOUNTER — Encounter
Admission: RE | Disposition: A | Discharge status: Home / Self Care | Payer: Self-pay | Source: Home / Self Care | Attending: Gastroenterology

## 2022-01-19 ENCOUNTER — Ambulatory Visit
Admission: RE | Admit: 2022-01-19 | Discharge: 2022-01-19 | Disposition: A | Discharge status: Home / Self Care | Payer: Medicare Other | Attending: Gastroenterology | Admitting: Gastroenterology

## 2022-01-19 DIAGNOSIS — K635 Polyp of colon: Secondary | ICD-10-CM | POA: Diagnosis not present

## 2022-01-19 DIAGNOSIS — R569 Unspecified convulsions: Secondary | ICD-10-CM | POA: Insufficient documentation

## 2022-01-19 DIAGNOSIS — I10 Essential (primary) hypertension: Secondary | ICD-10-CM | POA: Diagnosis not present

## 2022-01-19 DIAGNOSIS — D126 Benign neoplasm of colon, unspecified: Secondary | ICD-10-CM

## 2022-01-19 DIAGNOSIS — Z8673 Personal history of transient ischemic attack (TIA), and cerebral infarction without residual deficits: Secondary | ICD-10-CM | POA: Insufficient documentation

## 2022-01-19 DIAGNOSIS — Z86010 Personal history of colonic polyps: Secondary | ICD-10-CM | POA: Diagnosis not present

## 2022-01-19 DIAGNOSIS — D125 Benign neoplasm of sigmoid colon: Secondary | ICD-10-CM | POA: Diagnosis not present

## 2022-01-19 DIAGNOSIS — Z9682 Presence of neurostimulator: Secondary | ICD-10-CM | POA: Insufficient documentation

## 2022-01-19 DIAGNOSIS — Z1211 Encounter for screening for malignant neoplasm of colon: Secondary | ICD-10-CM | POA: Insufficient documentation

## 2022-01-19 HISTORY — PX: COLONOSCOPY WITH PROPOFOL: SHX5780

## 2022-01-19 SURGERY — COLONOSCOPY WITH PROPOFOL
Anesthesia: General

## 2022-01-19 MED ORDER — SODIUM CHLORIDE 0.9 % IV SOLN: INTRAVENOUS | Status: DC

## 2022-01-19 MED ORDER — LIDOCAINE HCL (CARDIAC) PF 100 MG/5ML IV SOSY
PREFILLED_SYRINGE | INTRAVENOUS | Status: DC | PRN
  Administered 2022-01-19: 50 mg via INTRAVENOUS

## 2022-01-19 MED ORDER — STERILE WATER FOR IRRIGATION IR SOLN
Status: DC | PRN
  Administered 2022-01-19: 60 mL

## 2022-01-19 MED ORDER — PROPOFOL 500 MG/50ML IV EMUL
INTRAVENOUS | Status: DC | PRN
  Administered 2022-01-19: 130 ug/kg/min via INTRAVENOUS

## 2022-01-19 MED ORDER — PROPOFOL 10 MG/ML IV BOLUS
INTRAVENOUS | Status: DC | PRN
  Administered 2022-01-19: 70 mg via INTRAVENOUS

## 2022-01-19 NOTE — Op Note (Signed)
[facility] Patient Name: Jared Grimes Procedure Date: 01/19/2022 11:32 AM MRN: [id number] Account #: [id number] Date of Birth: 1965-07-11 Admit Type: Outpatient Age: 56 Room: [facility] ENDO ROOM 2 Gender: Male Note Status: Finalized Instrument Name: Jasper Riling [id number] Procedure:             Colonoscopy Indications:           Surveillance: Personal history of adenomatous polyps                         on last colonoscopy > 3 years ago Providers:             [name] MD, MD Referring MD:          Adrian Prows (Referring MD) Medicines:             Monitored Anesthesia Care Complications:         No immediate complications. Procedure:             Pre-Anesthesia Assessment:                        - Prior to the procedure, a History and Physical was                         performed, and patient medications, allergies and                         sensitivities were reviewed. The patient's tolerance                         of previous anesthesia was reviewed.                        - The risks and benefits of the procedure and the                         sedation options and risks were discussed with the                         patient. All questions were answered and informed                         consent was obtained.                        - ASA Grade Assessment: II - A patient with mild                         systemic disease.                        After obtaining informed consent, the colonoscope was                         passed under direct vision. Throughout the procedure,                         the patient's blood pressure, pulse, and oxygen                         saturations  were monitored continuously. The                         Colonoscope was introduced through the anus and                         advanced to the the cecum, identified by the                         appendiceal orifice. The colonoscopy was performed                          with ease. The patient tolerated the procedure well.                         The quality of the bowel preparation was good. The                         appendiceal orifice was photographed. Findings:      The perianal and digital rectal examinations were normal.      A 3 mm polyp was found in the cecum. The polyp was sessile. The polyp       was removed with a cold biopsy forceps. Resection and retrieval were       complete. Biopsies were taken with a cold forceps for histology.      A 5 mm polyp was found in the sigmoid colon. The polyp was sessile. The       polyp was removed with a cold snare. Resection and retrieval were       complete.      The exam was otherwise without abnormality on direct and retroflexion       views. Impression:            - One 3 mm polyp in the cecum, removed with a cold                         biopsy forceps. Resected and retrieved. Biopsied.                        - One 5 mm polyp in the sigmoid colon, removed with a                         cold snare. Resected and retrieved.                        - The examination was otherwise normal on direct and                         retroflexion views. Recommendation:        - Discharge patient to home (with escort).                        - Resume previous diet.                        - Continue present medications.                        - Await pathology results.                        -  Repeat colonoscopy for surveillance based on                         pathology results. Procedure Code(s):     --- Professional ---                        [id number], Colonoscopy, flexible; with removal of                         tumor(s), polyp(s), or other lesion(s) by snare                         technique                        45380, 62, Colonoscopy, flexible; with biopsy, single                         or multiple Diagnosis Code(s):     --- Professional ---                        Z86.010, Personal history of colonic  polyps                        D12.0, Benign neoplasm of cecum                        D12.5, Benign neoplasm of sigmoid colon CPT copyright 2022 American Medical Association. All rights reserved. The codes documented in this report are preliminary and upon coder review may  be revised to meet current compliance requirements. [name], MD [name] MD, MD 01/19/2022 12:12:31 PM This report has been signed electronically. Number of Addenda: 0 Note Initiated On: 01/19/2022 11:32 AM Scope Withdrawal Time: 0 hours 12 minutes 51 seconds  Total Procedure Duration: 0 hours 15 minutes 31 seconds  Estimated Blood Loss:  Estimated blood loss: none.      [facility]

## 2022-01-19 NOTE — H&P (Signed)
Jared Bellows, MD [address] [place], [place], [place], [address] [address], [place], [place], [place], [address] Phone: [phone]  Fax: [phone]  Primary Care Physician:  Leonel Ramsay, MD   Pre-Procedure History & Physical: HPI:  Jared Grimes is a 56 y.o. male is here for an colonoscopy.   Past Medical History:  Diagnosis Date   Hypertension    Seizures ([place])    Stroke [facility])     Past Surgical History:  Procedure Laterality Date   COLONOSCOPY WITH PROPOFOL N/A 10/22/2015   Procedure: COLONOSCOPY WITH PROPOFOL;  Surgeon: Lollie Sails, MD;  Location: [facility] ENDOSCOPY;  Service: Endoscopy;  Laterality: N/A;   ESOPHAGOGASTRODUODENOSCOPY (EGD) WITH PROPOFOL N/A 09/08/2020   Procedure: ESOPHAGOGASTRODUODENOSCOPY (EGD) WITH PROPOFOL;  Surgeon: Jared Bellows, MD;  Location: [facility] ENDOSCOPY;  Service: Gastroenterology;  Laterality: N/A;   ESOPHAGOGASTRODUODENOSCOPY (EGD) WITH PROPOFOL N/A 10/29/2020   Procedure: ESOPHAGOGASTRODUODENOSCOPY (EGD) WITH PROPOFOL;  Surgeon: Jared Bellows, MD;  Location: [facility] ENDOSCOPY;  Service: Gastroenterology;  Laterality: N/A;   NERVE SURGERY     VAGUS NERVE STIMULATOR INSERTION N/A 02/03/2015   Procedure: VAGAL NERVE STIMULATOR IMPLANT LEFT SIDED;  Surgeon: Consuella Lose, MD;  Location: [place] NEURO ORS;  Service: Neurosurgery;  Laterality: N/A;    Prior to Admission medications   Medication Sig Start Date End Date Taking? Authorizing Provider  amLODipine (NORVASC) 5 MG tablet Take 5 mg by mouth daily. 08/24/20  Yes [provider]  atenolol (TENORMIN) 50 MG tablet Take 50 mg by mouth daily. 07/28/20  Yes [provider]  atorvastatin (LIPITOR) 40 MG tablet Take 1 tablet (40 mg total) by mouth at bedtime. 02/08/16  Yes Theodoro Grist, MD  Calcium Carbonate-Vitamin D 600-200 MG-UNIT TABS Take 1 tablet by mouth 2 (two) times daily.   Yes [provider]  carbamazepine (TEGRETOL XR) 200 MG 12 hr tablet Take 3 tablets  (600 mg total) by mouth 2 (two) times daily. 800 mg every morning and 600 mg at bedtime 02/20/16  Yes Fritzi Mandes, MD  cholecalciferol (VITAMIN D3) 25 MCG (1000 UNIT) tablet Take 1 tablet by mouth daily.   Yes [provider]  cyclobenzaprine (FLEXERIL) 10 MG tablet Take 10 mg by mouth 2 (two) times daily as needed. 08/13/20  Yes [provider]  lamoTRIgine (LAMICTAL) 150 MG tablet Take 300 mg by mouth 2 (two) times daily. 07/29/20  Yes [provider]  losartan (COZAAR) 100 MG tablet Take 1 tablet (100 mg total) by mouth daily. 02/10/16  Yes Theodoro Grist, MD  losartan (COZAAR) 50 MG tablet Take 50 mg by mouth daily. 08/27/20  Yes [provider]  pantoprazole (PROTONIX) 40 MG tablet Take 1 tablet (40 mg total) by mouth 2 (two) times daily. 09/10/20  Yes Sharen Hones, MD  traZODone (DESYREL) 100 MG tablet Take 200 mg by mouth at bedtime as needed. 07/28/20  Yes [provider]  betamethasone dipropionate 0.05 % cream Apply 1 application topically 2 (two) times daily as needed. 07/28/20   [provider]  Cyanocobalamin ([employer] B-12 PO) Take 2 tablets by mouth daily.    [provider]  DULoxetine (CYMBALTA) 20 MG capsule Take 20 mg by mouth daily. 07/29/20   [provider]    Allergies as of 01/06/2022   (No Known Allergies)    History reviewed. No pertinent family history.  Social History   Socioeconomic History   Marital status: Single    Spouse name: Not on file   Number  of children: Not on file   Years of education: Not on file   Highest education level: Not on file  Occupational History   Not on file  Tobacco Use   Smoking status: Never   Smokeless tobacco: Never  Substance and Sexual Activity   Alcohol use: No   Drug use: No   Sexual activity: Not on file  Other Topics Concern   Not on file  Social History Narrative   Not on file   Social Determinants of Health   Financial Resource Strain: Not on file   Food Insecurity: Not on file  Transportation Needs: Not on file  Physical Activity: Not on file  Stress: Not on file  Social Connections: Not on file  Intimate Partner Violence: Not on file    Review of Systems: See HPI, otherwise negative ROS  Physical Exam: There were no vitals taken for this visit. General:   Alert,  pleasant and cooperative in NAD Head:  Normocephalic and atraumatic. Neck:  Supple; no masses or thyromegaly. Lungs:  Clear throughout to auscultation, normal respiratory effort.    Heart:  +S1, +S2, Regular rate and rhythm, No edema. Abdomen:  Soft, nontender and nondistended. Normal bowel sounds, without guarding, and without rebound.   Neurologic:  Alert and  oriented x4;  grossly normal neurologically.  Impression/Plan: [name] is here for an colonoscopy to be performed for surveillance due to prior history of colon polyps   Risks, benefits, limitations, and alternatives regarding  colonoscopy have been reviewed with the patient.  Questions have been answered.  All parties agreeable.   Jared Bellows, MD  01/19/2022, 11:38 AM

## 2022-01-19 NOTE — Anesthesia Postprocedure Evaluation (Signed)
Anesthesia Post Note  Patient: Jared Grimes  Procedure(s) Performed: COLONOSCOPY WITH PROPOFOL  Patient location during evaluation: PACU Anesthesia Type: General Level of consciousness: awake and alert Pain management: pain level controlled Vital Signs Assessment: post-procedure vital signs reviewed and stable Respiratory status: spontaneous breathing, nonlabored ventilation, respiratory function stable and patient connected to nasal cannula oxygen Cardiovascular status: stable and blood pressure returned to baseline Postop Assessment: no apparent nausea or vomiting Anesthetic complications: no   No notable events documented.   Last Vitals:  Vitals:   01/19/22 1215 01/19/22 1224  BP: 112/65 105/78  Pulse: 91 98  Resp: 17 15  Temp:    SpO2: 96% 100%    Last Pain:  Vitals:   01/19/22 1224  TempSrc:   PainSc: 0-No pain                 Milinda Pointer

## 2022-01-19 NOTE — Anesthesia Preprocedure Evaluation (Signed)
Anesthesia Evaluation  Patient identified by MRN, date of birth, ID band Patient awake    Reviewed: Allergy & Precautions, NPO status , Patient's Chart, lab work & pertinent test results  History of Anesthesia Complications Negative for: history of anesthetic complications  Airway Mallampati: III  TM Distance: <3 FB Neck ROM: full    Dental  (+) Chipped, Poor Dentition, Missing, Partial Upper, Caps   Pulmonary neg pulmonary ROS, neg shortness of breath   Pulmonary exam normal        Cardiovascular Exercise Tolerance: Good hypertension, (-) angina Normal cardiovascular exam     Neuro/Psych Seizures -,  TIACVA  negative psych ROS   GI/Hepatic Neg liver ROS,neg GERD  ,,melena    Endo/Other  negative endocrine ROS    Renal/GU negative Renal ROS  negative genitourinary   Musculoskeletal   Abdominal Normal abdominal exam  (+)   Peds  Hematology negative hematology ROS (+) Blood dyscrasia, anemia   Anesthesia Other Findings Status post placement of VNS (vagus nerve stimulation) device  Past Medical History: No date: Hypertension No date: Seizures ([place]) No date: Stroke [facility])  Past Surgical History: 10/22/2015: COLONOSCOPY WITH PROPOFOL; N/A     Comment:  Procedure: COLONOSCOPY WITH PROPOFOL;  Surgeon: Lollie Sails, MD;  Location: [facility] ENDOSCOPY;  Service:               Endoscopy;  Laterality: N/A; No date: NERVE SURGERY 02/03/2015: VAGUS NERVE STIMULATOR INSERTION; N/A     Comment:  Procedure: VAGAL NERVE STIMULATOR IMPLANT LEFT SIDED;                Surgeon: Consuella Lose, MD;  Location: [facility] NEURO ORS;               Service: Neurosurgery;  Laterality: N/A;  BMI    Body Mass Index: 30.59 kg/m      Reproductive/Obstetrics negative OB ROS                             Anesthesia Physical Anesthesia Plan  ASA: 3  Anesthesia Plan: General   Post-op Pain  Management:    Induction: Intravenous  PONV Risk Score and Plan: Propofol infusion and TIVA  Airway Management Planned: Natural Airway and Nasal Cannula  Additional Equipment:   Intra-op Plan:   Post-operative Plan:   Informed Consent: I have reviewed the patients History and Physical, chart, labs and discussed the procedure including the risks, benefits and alternatives for the proposed anesthesia with the patient or authorized representative who has indicated his/her understanding and acceptance.     Dental Advisory Given  Plan Discussed with: Anesthesiologist, CRNA and Surgeon  Anesthesia Plan Comments: (IVGA, PIV x 1, ASA SM, RBO discussed, Consent signed.)        Anesthesia Quick Evaluation

## 2022-01-19 NOTE — Transfer of Care (Signed)
Immediate Anesthesia Transfer of Care Note  Patient: Jared Grimes  Procedure(s) Performed: COLONOSCOPY WITH PROPOFOL  Patient Location: PACU  Anesthesia Type:General  Level of Consciousness: sedated  Airway & Oxygen Therapy: Patient Spontanous Breathing  Post-op Assessment: Report given to RN and Post -op Vital signs reviewed and stable  Post vital signs: Reviewed and stable  Last Vitals:  Vitals Value Taken Time  BP 112/65 01/19/22 1215  Temp    Pulse 91 01/19/22 1215  Resp 17 01/19/22 1215  SpO2 96 % 01/19/22 1215    Last Pain:  Vitals:   01/19/22 1138  TempSrc: Temporal  PainSc: 0-No pain         Complications: No notable events documented.

## 2022-01-21 LAB — SURGICAL PATHOLOGY

## 2022-01-24 ENCOUNTER — Encounter: Payer: Self-pay | Admitting: Gastroenterology

## 2022-12-02 ENCOUNTER — Emergency Department
Admission: EM | Admit: 2022-12-02 | Discharge: 2022-12-02 | Disposition: A | Discharge status: Home / Self Care | Payer: 59 | Attending: Emergency Medicine | Admitting: Emergency Medicine

## 2022-12-02 ENCOUNTER — Other Ambulatory Visit: Payer: Self-pay

## 2022-12-02 DIAGNOSIS — R569 Unspecified convulsions: Secondary | ICD-10-CM | POA: Diagnosis present

## 2022-12-02 LAB — BASIC METABOLIC PANEL
Anion gap: 10 (ref 5–15)
BUN: 12 mg/dL (ref 6–20)
CO2: 27 mmol/L (ref 22–32)
Calcium: 9.1 mg/dL (ref 8.9–10.3)
Chloride: 90 mmol/L — ABNORMAL LOW (ref 98–111)
Creatinine, Ser: 0.79 mg/dL (ref 0.61–1.24)
GFR, Estimated: >60 mL/min (ref >60)
Glucose, Bld: 105 mg/dL — ABNORMAL HIGH (ref 70–99)
Potassium: 4.7 mmol/L (ref 3.5–5.1)
Sodium: 127 mmol/L — ABNORMAL LOW (ref 135–145)

## 2022-12-02 LAB — CBG MONITORING, ED: Glucose-Capillary: 92 mg/dL (ref 70–99)

## 2022-12-02 LAB — CARBAMAZEPINE LEVEL, TOTAL: Carbamazepine Lvl: 8.2 ug/mL (ref 4.0–12.0)

## 2022-12-02 NOTE — ED Triage Notes (Addendum)
Pt to ED via EMS from [facility] for seizure activity. Pt fell from standing and did hit his head and began having full body seizure activity..Pt has hx of epilepsy. Seizure was 2 minutes long and witnessed by pharmacist. EMS reports pt was A&Ox4 on arrival. Pt A&Ox4 at this time. Pt denies pain. Pt reports being compliant with medication.  EMS vitals: 107 HR  CBG 110 SPO2 95% BP 164/99  Temp 98.1 oral
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
## 2023-07-04 ENCOUNTER — Inpatient Hospital Stay

## 2023-07-04 ENCOUNTER — Emergency Department

## 2023-07-04 ENCOUNTER — Inpatient Hospital Stay
Admission: EM | Admit: 2023-07-04 | Discharge: 2023-07-18 | DRG: 493 | Disposition: A | Discharge status: Skilled Nursing Facility | Attending: Student | Admitting: Student

## 2023-07-04 ENCOUNTER — Other Ambulatory Visit: Payer: Self-pay

## 2023-07-04 ENCOUNTER — Encounter: Payer: Self-pay | Admitting: *Deleted

## 2023-07-04 DIAGNOSIS — E785 Hyperlipidemia, unspecified: Secondary | ICD-10-CM | POA: Diagnosis present

## 2023-07-04 DIAGNOSIS — Z751 Person awaiting admission to adequate facility elsewhere: Secondary | ICD-10-CM | POA: Diagnosis not present

## 2023-07-04 DIAGNOSIS — M79671 Pain in right foot: Secondary | ICD-10-CM | POA: Diagnosis present

## 2023-07-04 DIAGNOSIS — S93431A Sprain of tibiofibular ligament of right ankle, initial encounter: Secondary | ICD-10-CM | POA: Diagnosis present

## 2023-07-04 DIAGNOSIS — D649 Anemia, unspecified: Secondary | ICD-10-CM | POA: Diagnosis not present

## 2023-07-04 DIAGNOSIS — G40909 Epilepsy, unspecified, not intractable, without status epilepticus: Secondary | ICD-10-CM | POA: Diagnosis present

## 2023-07-04 DIAGNOSIS — R319 Hematuria, unspecified: Secondary | ICD-10-CM | POA: Diagnosis not present

## 2023-07-04 DIAGNOSIS — D72829 Elevated white blood cell count, unspecified: Secondary | ICD-10-CM | POA: Diagnosis present

## 2023-07-04 DIAGNOSIS — Y92009 Unspecified place in unspecified non-institutional (private) residence as the place of occurrence of the external cause: Secondary | ICD-10-CM

## 2023-07-04 DIAGNOSIS — G629 Polyneuropathy, unspecified: Secondary | ICD-10-CM | POA: Diagnosis present

## 2023-07-04 DIAGNOSIS — Z9682 Presence of neurostimulator: Secondary | ICD-10-CM | POA: Diagnosis not present

## 2023-07-04 DIAGNOSIS — R339 Retention of urine, unspecified: Secondary | ICD-10-CM | POA: Diagnosis not present

## 2023-07-04 DIAGNOSIS — S82851A Displaced trimalleolar fracture of right lower leg, initial encounter for closed fracture: Secondary | ICD-10-CM | POA: Diagnosis present

## 2023-07-04 DIAGNOSIS — Z79899 Other long term (current) drug therapy: Secondary | ICD-10-CM

## 2023-07-04 DIAGNOSIS — W010XXA Fall on same level from slipping, tripping and stumbling without subsequent striking against object, initial encounter: Secondary | ICD-10-CM | POA: Diagnosis present

## 2023-07-04 DIAGNOSIS — N179 Acute kidney failure, unspecified: Secondary | ICD-10-CM | POA: Diagnosis present

## 2023-07-04 DIAGNOSIS — Z8673 Personal history of transient ischemic attack (TIA), and cerebral infarction without residual deficits: Secondary | ICD-10-CM | POA: Diagnosis not present

## 2023-07-04 DIAGNOSIS — Z6833 Body mass index (BMI) 33.0-33.9, adult: Secondary | ICD-10-CM | POA: Diagnosis not present

## 2023-07-04 DIAGNOSIS — R4182 Altered mental status, unspecified: Secondary | ICD-10-CM | POA: Diagnosis not present

## 2023-07-04 DIAGNOSIS — I69354 Hemiplegia and hemiparesis following cerebral infarction affecting left non-dominant side: Secondary | ICD-10-CM | POA: Diagnosis not present

## 2023-07-04 DIAGNOSIS — R4587 Impulsiveness: Secondary | ICD-10-CM | POA: Diagnosis not present

## 2023-07-04 DIAGNOSIS — F32A Depression, unspecified: Secondary | ICD-10-CM | POA: Diagnosis present

## 2023-07-04 DIAGNOSIS — S82891D Other fracture of right lower leg, subsequent encounter for closed fracture with routine healing: Secondary | ICD-10-CM | POA: Diagnosis not present

## 2023-07-04 DIAGNOSIS — S82891A Other fracture of right lower leg, initial encounter for closed fracture: Secondary | ICD-10-CM | POA: Diagnosis not present

## 2023-07-04 DIAGNOSIS — R110 Nausea: Secondary | ICD-10-CM | POA: Diagnosis not present

## 2023-07-04 DIAGNOSIS — M79604 Pain in right leg: Secondary | ICD-10-CM

## 2023-07-04 DIAGNOSIS — I10 Essential (primary) hypertension: Secondary | ICD-10-CM | POA: Diagnosis present

## 2023-07-04 DIAGNOSIS — R471 Dysarthria and anarthria: Secondary | ICD-10-CM | POA: Diagnosis not present

## 2023-07-04 DIAGNOSIS — E669 Obesity, unspecified: Secondary | ICD-10-CM | POA: Diagnosis present

## 2023-07-04 DIAGNOSIS — Z87820 Personal history of traumatic brain injury: Secondary | ICD-10-CM

## 2023-07-04 DIAGNOSIS — W19XXXA Unspecified fall, initial encounter: Secondary | ICD-10-CM | POA: Diagnosis not present

## 2023-07-04 DIAGNOSIS — E871 Hypo-osmolality and hyponatremia: Secondary | ICD-10-CM | POA: Diagnosis present

## 2023-07-04 DIAGNOSIS — E66811 Obesity, class 1: Secondary | ICD-10-CM | POA: Diagnosis present

## 2023-07-04 DIAGNOSIS — R410 Disorientation, unspecified: Secondary | ICD-10-CM | POA: Diagnosis not present

## 2023-07-04 DIAGNOSIS — R4781 Slurred speech: Secondary | ICD-10-CM | POA: Diagnosis not present

## 2023-07-04 LAB — CREATININE, SERUM
Creatinine, Ser: 0.78 mg/dL (ref 0.61–1.24)
GFR, Estimated: >60 mL/min (ref >60)

## 2023-07-04 LAB — PROTIME-INR
INR: 1.1 (ref 0.8–1.2)
Prothrombin Time: 14.2 s (ref 11.4–15.2)

## 2023-07-04 LAB — BASIC METABOLIC PANEL WITH GFR
Anion gap: 18 — ABNORMAL HIGH (ref 5–15)
BUN: UNDETERMINED mg/dL (ref 6–20)
CO2: 17 mmol/L — ABNORMAL LOW (ref 22–32)
Calcium: 9.5 mg/dL (ref 8.9–10.3)
Chloride: 101 mmol/L (ref 98–111)
Creatinine, Ser: UNDETERMINED mg/dL (ref 0.61–1.24)
Glucose, Bld: 95 mg/dL (ref 70–99)
Potassium: 4.9 mmol/L (ref 3.5–5.1)
Sodium: 136 mmol/L (ref 135–145)

## 2023-07-04 LAB — APTT: aPTT: 28 s (ref 24–36)

## 2023-07-04 LAB — URINALYSIS, W/ REFLEX TO CULTURE (INFECTION SUSPECTED)
Bacteria, UA: NONE SEEN
Bilirubin Urine: NEGATIVE
Glucose, UA: NEGATIVE mg/dL
Hgb urine dipstick: NEGATIVE
Ketones, ur: 5 mg/dL — AB
Leukocytes,Ua: NEGATIVE
Nitrite: NEGATIVE
Protein, ur: 100 mg/dL — AB
Specific Gravity, Urine: 1.027 (ref 1.005–1.030)
pH: 6 (ref 5.0–8.0)

## 2023-07-04 LAB — CBC
HCT: 52.8 % — ABNORMAL HIGH (ref 39.0–52.0)
Hemoglobin: 17.1 g/dL — ABNORMAL HIGH (ref 13.0–17.0)
MCH: 31 pg (ref 26.0–34.0)
MCHC: 32.4 g/dL (ref 30.0–36.0)
MCV: 95.8 fL (ref 80.0–100.0)
Platelets: 358 10*3/uL (ref 150–400)
RBC: 5.51 MIL/uL (ref 4.22–5.81)
RDW: 12.9 % (ref 11.5–15.5)
WBC: 14 10*3/uL — ABNORMAL HIGH (ref 4.0–10.5)
nRBC: 0 % (ref 0.0–0.2)

## 2023-07-04 LAB — BUN: BUN: 26 mg/dL — ABNORMAL HIGH (ref 6–20)

## 2023-07-04 LAB — TROPONIN I (HIGH SENSITIVITY)
Troponin I (High Sensitivity): 11 ng/L (ref <18)
Troponin I (High Sensitivity): 13 ng/L (ref <18)

## 2023-07-04 LAB — MRSA NEXT GEN BY PCR, NASAL: MRSA by PCR Next Gen: NOT DETECTED

## 2023-07-04 MED ORDER — LIDOCAINE 5 % EX PTCH
1.0000 | MEDICATED_PATCH | CUTANEOUS | Status: DC
  Administered 2023-07-04: 1 via TRANSDERMAL
  Filled 2023-07-04 (×6): qty 1

## 2023-07-04 MED ORDER — LAMOTRIGINE 100 MG PO TABS
300.0000 mg | ORAL_TABLET | Freq: Two times a day (BID) | ORAL | Status: DC
  Administered 2023-07-04 – 2023-07-08 (×8): 300 mg via ORAL
  Filled 2023-07-04: qty 3
  Filled 2023-07-04: qty 12
  Filled 2023-07-04 (×4): qty 3
  Filled 2023-07-04: qty 12
  Filled 2023-07-04: qty 3
  Filled 2023-07-04: qty 12
  Filled 2023-07-04 (×2): qty 3
  Filled 2023-07-04: qty 12

## 2023-07-04 MED ORDER — MORPHINE SULFATE (PF) 4 MG/ML IV SOLN
4.0000 mg | Freq: Once | INTRAVENOUS | Status: AC
  Administered 2023-07-04: 4 mg via INTRAVENOUS
  Filled 2023-07-04: qty 1

## 2023-07-04 MED ORDER — ATENOLOL 25 MG PO TABS: 50.0000 mg | ORAL_TABLET | Freq: Every day | ORAL | Status: DC

## 2023-07-04 MED ORDER — CHLORHEXIDINE GLUCONATE 4 % EX SOLN
60.0000 mL | Freq: Once | CUTANEOUS | Status: AC
  Administered 2023-07-04: 4 via TOPICAL

## 2023-07-04 MED ORDER — METHOCARBAMOL 500 MG PO TABS
500.0000 mg | ORAL_TABLET | Freq: Three times a day (TID) | ORAL | Status: DC | PRN
  Administered 2023-07-06 – 2023-07-17 (×7): 500 mg via ORAL
  Filled 2023-07-04 (×7): qty 1

## 2023-07-04 MED ORDER — POVIDONE-IODINE 10 % EX SWAB
2.0000 | Freq: Once | CUTANEOUS | Status: AC
  Administered 2023-07-04: 2 via TOPICAL

## 2023-07-04 MED ORDER — CEFAZOLIN SODIUM-DEXTROSE 2-4 GM/100ML-% IV SOLN
2.0000 g | INTRAVENOUS | Status: AC
  Administered 2023-07-05: 2 g via INTRAVENOUS

## 2023-07-04 MED ORDER — PANTOPRAZOLE SODIUM 40 MG PO TBEC
40.0000 mg | DELAYED_RELEASE_TABLET | Freq: Two times a day (BID) | ORAL | Status: DC
  Administered 2023-07-04 – 2023-07-18 (×28): 40 mg via ORAL
  Filled 2023-07-04 (×28): qty 1

## 2023-07-04 MED ORDER — MORPHINE SULFATE (PF) 2 MG/ML IV SOLN
2.0000 mg | INTRAVENOUS | Status: DC | PRN
  Administered 2023-07-13: 2 mg via INTRAVENOUS
  Filled 2023-07-04: qty 1

## 2023-07-04 MED ORDER — ACETAMINOPHEN 325 MG PO TABS
650.0000 mg | ORAL_TABLET | Freq: Four times a day (QID) | ORAL | Status: DC | PRN
  Administered 2023-07-04 – 2023-07-11 (×3): 650 mg via ORAL
  Filled 2023-07-04 (×3): qty 2

## 2023-07-04 MED ORDER — HYDRALAZINE HCL 20 MG/ML IJ SOLN: 5.0000 mg | INTRAMUSCULAR | Status: DC | PRN

## 2023-07-04 MED ORDER — ATORVASTATIN CALCIUM 20 MG PO TABS
40.0000 mg | ORAL_TABLET | Freq: Every day | ORAL | Status: DC
  Administered 2023-07-04 – 2023-07-17 (×14): 40 mg via ORAL
  Filled 2023-07-04 (×14): qty 2

## 2023-07-04 MED ORDER — DULOXETINE HCL 20 MG PO CPEP
20.0000 mg | ORAL_CAPSULE | Freq: Every day | ORAL | Status: DC
  Administered 2023-07-05 – 2023-07-18 (×14): 20 mg via ORAL
  Filled 2023-07-04 (×14): qty 1

## 2023-07-04 MED ORDER — ONDANSETRON HCL 4 MG/2ML IJ SOLN: 4.0000 mg | Freq: Three times a day (TID) | INTRAMUSCULAR | Status: DC | PRN

## 2023-07-04 MED ORDER — CARBAMAZEPINE ER 100 MG PO TB12
600.0000 mg | ORAL_TABLET | Freq: Two times a day (BID) | ORAL | Status: DC
  Administered 2023-07-05 – 2023-07-08 (×8): 600 mg via ORAL
  Filled 2023-07-04 (×8): qty 3
  Filled 2023-07-04: qty 6
  Filled 2023-07-04: qty 3
  Filled 2023-07-04: qty 6

## 2023-07-04 MED ORDER — AMLODIPINE BESYLATE 5 MG PO TABS: 5.0000 mg | ORAL_TABLET | Freq: Every day | ORAL | Status: DC

## 2023-07-04 MED ORDER — SENNOSIDES-DOCUSATE SODIUM 8.6-50 MG PO TABS
1.0000 | ORAL_TABLET | Freq: Every evening | ORAL | Status: DC | PRN
  Administered 2023-07-12 – 2023-07-17 (×2): 1 via ORAL
  Filled 2023-07-04 (×2): qty 1

## 2023-07-04 MED ORDER — TRAZODONE HCL 100 MG PO TABS
200.0000 mg | ORAL_TABLET | Freq: Every evening | ORAL | Status: DC | PRN
Start: 2023-07-04 — End: 2023-07-18
  Administered 2023-07-05 – 2023-07-17 (×10): 200 mg via ORAL
  Filled 2023-07-04 (×12): qty 2

## 2023-07-04 MED ORDER — OXYCODONE-ACETAMINOPHEN 5-325 MG PO TABS
1.0000 | ORAL_TABLET | ORAL | Status: DC | PRN
  Administered 2023-07-06 – 2023-07-17 (×12): 1 via ORAL
  Filled 2023-07-04 (×12): qty 1
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
## 2023-07-04 NOTE — Consult Note (Signed)
 ORTHOPAEDIC CONSULTATION  REQUESTING PHYSICIAN: Niu, Xilin, MD  Chief Complaint: Right ankle fracture  HPI: Jared Grimes is a 58 y.o. male who complains of recent injury right ankle.  Admitted after unwitnessed fall.  Patient not completely able to provide history in regards to fracture.  Fell 2 days ago.  Upon admission noted bruising to the ankle.  X-ray showed fracture.  Past Medical History:  Diagnosis Date   Hypertension    Seizures (HCC)    Stroke [facility])    Past Surgical History:  Procedure Laterality Date   COLONOSCOPY WITH PROPOFOL  N/A 10/22/2015   Procedure: COLONOSCOPY WITH PROPOFOL ;  Surgeon: Deveron Fly, MD;  Location: [facility] ENDOSCOPY;  Service: Endoscopy;  Laterality: N/A;   COLONOSCOPY WITH PROPOFOL  N/A 01/19/2022   Procedure: COLONOSCOPY WITH PROPOFOL ;  Surgeon: Luke Salaam, MD;  Location: [facility] ENDOSCOPY;  Service: Gastroenterology;  Laterality: N/A;   ESOPHAGOGASTRODUODENOSCOPY (EGD) WITH PROPOFOL  N/A 09/08/2020   Procedure: ESOPHAGOGASTRODUODENOSCOPY (EGD) WITH PROPOFOL ;  Surgeon: Luke Salaam, MD;  Location: [facility] ENDOSCOPY;  Service: Gastroenterology;  Laterality: N/A;   ESOPHAGOGASTRODUODENOSCOPY (EGD) WITH PROPOFOL  N/A 10/29/2020   Procedure: ESOPHAGOGASTRODUODENOSCOPY (EGD) WITH PROPOFOL ;  Surgeon: Luke Salaam, MD;  Location: [facility] ENDOSCOPY;  Service: Gastroenterology;  Laterality: N/A;   NERVE SURGERY     VAGUS NERVE STIMULATOR INSERTION N/A 02/03/2015   Procedure: VAGAL NERVE STIMULATOR IMPLANT LEFT SIDED;  Surgeon: Augusto Blonder, MD;  Location: [facility] NEURO ORS;  Service: Neurosurgery;  Laterality: N/A;   Social History   Socioeconomic History   Marital status: Single    Spouse name: Not on file   Number of children: Not on file   Years of education: Not on file   Highest education level: Not on file  Occupational History   Not on file  Tobacco Use   Smoking status: Never   Smokeless tobacco: Never  Vaping Use   Vaping status: Never Used  Substance  and Sexual Activity   Alcohol use: No   Drug use: No   Sexual activity: Not on file  Other Topics Concern   Not on file  Social History Narrative   Not on file   Social Drivers of Health   Financial Resource Strain: Low Risk  (01/02/2023)   Received from [facility]   Overall Financial Resource Strain (CARDIA)    Difficulty of Paying Living Expenses: Not hard at all  Food Insecurity: No Food Insecurity (01/02/2023)   Received from [facility]   Hunger Vital Sign    Ran Out of Food in the Last Year: Never true    Worried About Running Out of Food in the Last Year: Never true  Transportation Needs: No Transportation Needs (01/02/2023)   Received from [facility]   PRAPARE - Transportation    Lack of Transportation (Non-Medical): No    In the past 12 months, has lack of transportation kept you from medical appointments or from getting medications?: No  Physical Activity: Not on file  Stress: Not on file  Social Connections: Not on file   History reviewed. No pertinent family history. No Known Allergies Prior to Admission medications   Medication Sig Start Date End Date Taking? Authorizing Provider  amLODipine (NORVASC) 5 MG tablet Take 5 mg by mouth daily. 08/24/20   [provider]  atenolol (TENORMIN) 50 MG tablet Take 50 mg by mouth daily. 07/28/20   [provider]  atorvastatin  (LIPITOR) 40 MG tablet Take 1 tablet (40 mg total) by mouth  at bedtime. 02/08/16   Vaickute, Rima, MD  betamethasone dipropionate 0.05 % cream Apply 1 application topically 2 (two) times daily as needed. 07/28/20   [provider]  Calcium  Carbonate-Vitamin D  600-200 MG-UNIT TABS Take 1 tablet by mouth 2 (two) times daily.    [provider]  carbamazepine  (TEGRETOL  XR) 200 MG 12 hr tablet Take 3 tablets (600 mg total) by mouth 2 (two) times daily. 800 mg every morning and 600 mg at bedtime 02/20/16   Patel, Sona, MD   cholecalciferol (VITAMIN D3) 25 MCG (1000 UNIT) tablet Take 1 tablet by mouth daily.    [provider]  Cyanocobalamin ([employer] B-12 PO) Take 2 tablets by mouth daily.    [provider]  cyclobenzaprine (FLEXERIL) 10 MG tablet Take 10 mg by mouth 2 (two) times daily as needed. 08/13/20   [provider]  DULoxetine  (CYMBALTA ) 20 MG capsule Take 20 mg by mouth daily. 07/29/20   [provider]  lamoTRIgine  (LAMICTAL ) 150 MG tablet Take 300 mg by mouth 2 (two) times daily. 07/29/20   [provider]  losartan  (COZAAR ) 100 MG tablet Take 1 tablet (100 mg total) by mouth daily. 02/10/16   Vaickute, Rima, MD  losartan  (COZAAR ) 50 MG tablet Take 50 mg by mouth daily. 08/27/20   [provider]  pantoprazole  (PROTONIX ) 40 MG tablet Take 1 tablet (40 mg total) by mouth 2 (two) times daily. 09/10/20   Donaciano Frizzle, MD  traZODone  (DESYREL ) 100 MG tablet Take 200 mg by mouth at bedtime as needed. 07/28/20   [provider]   DG Tibia/Fibula Right Result Date: 07/04/2023 CLINICAL DATA:  pain EXAM: RIGHT TIBIA AND FIBULA - 2 VIEW COMPARISON:  X-ray right foot 07/04/2023, x-ray right knee 07/04/2023 FINDINGS: Acute displaced and comminuted transsyndesmotic distal fibular fracture. Poorly visualized acute posterior malleolar fracture. Cortical irregularity along the medial malleolus may represent an acute fracture. Ankle subcutaneus soft tissue edema. Redemonstration of possible acute on chronic nondisplaced fibular head neck and proximal shaft fracture. IMPRESSION: 1. Acute displaced and comminuted transsyndesmotic distal fibular fracture. 2. Poorly visualized acute posterior malleolar fracture. 3. Cortical irregularity along the medial malleolus may represent an acute fracture. 4. Recommend dedicated three view radiograph of the right ankle. 5. Redemonstration of possible acute on chronic nondisplaced fibular head neck and proximal shaft fracture. Correlate with  point tenderness to palpation. Electronically Signed   By: Morgane  Naveau M.D.   On: 07/04/2023 17:29   DG Knee Complete 4 Views Right Result Date: 07/04/2023 CLINICAL DATA:  pain Pt fell a few days ago Entire right lower leg is swollen, wound on foot and ankle, bruising Pt was unable to move at all and had to be held for all images EXAM: RIGHT KNEE - COMPLETE 4+ VIEW COMPARISON:  None Available. FINDINGS: Partially visualized intramedullary nail fixation of the distal femur. Cortical irregularity of the fibular head/neck and proximal shaft with query underlying nondisplaced acute fracture. No evidence of acute displaced fracture, dislocation, or joint effusion. No evidence of arthropathy or other focal bone abnormality. Soft tissues are unremarkable. IMPRESSION: Question acute on chronic nondisplaced proximal fibular shaft fracture. Correlate with point tenderness to palpation to evaluate for an underlying acute component. Electronically Signed   By: Morgane  Naveau M.D.   On: 07/04/2023 17:26   DG Foot Complete Right Result Date: 07/04/2023 CLINICAL DATA:  Pain EXAM: RIGHT FOOT COMPLETE - 3+ VIEW COMPARISON:  None Available. FINDINGS: There is soft tissue swelling of the foot  diffusely. There is an acute fracture of the distal fibula, mildly displaced. No additional fractures of the foot identified. IMPRESSION: 1. Acute fracture of the distal fibula, mildly displaced. Recommend dedicated views of the ankle. 2. Soft tissue swelling of the foot diffusely. Electronically Signed   By: Tyron Gallon M.D.   On: 07/04/2023 17:20   CT Cervical Spine Wo Contrast Result Date: 07/04/2023 CLINICAL DATA:  Status post fall 2 days ago. EXAM: CT CERVICAL SPINE WITHOUT CONTRAST TECHNIQUE: Multidetector CT imaging of the cervical spine was performed without intravenous contrast. Multiplanar CT image reconstructions were also generated. RADIATION DOSE REDUCTION: This exam was performed according to the departmental  dose-optimization program which includes automated exposure control, adjustment of the mA and/or kV according to patient size and/or use of iterative reconstruction technique. COMPARISON:  September 07, 2020 FINDINGS: Alignment: There is straightening of the normal cervical spine lordosis. Skull base and vertebrae: No acute fracture. No primary bone lesion or focal pathologic process. Soft tissues and spinal canal: No prevertebral fluid or swelling. No visible canal hematoma. Disc levels: Marked severity endplate sclerosis, anterior osteophyte formation and posterior bony spurring are seen at the levels of C4-C5 and C5-C6. Moderate to marked severity intervertebral disc space narrowing is seen at the level of C5-C6, with moderate severity intervertebral disc space narrowing seen throughout the remainder of the cervical spine. Bilateral marked severity multilevel facet joint hypertrophy is noted, left greater than right. Upper chest: Negative. Other: None. IMPRESSION: Marked severity multilevel degenerative changes, as described above, without evidence of an acute fracture or subluxation. Electronically Signed   By: Virgle Grime M.D.   On: 07/04/2023 16:12   CT Head Wo Contrast Result Date: 07/04/2023 CLINICAL DATA:  Status post fall 2 days ago. EXAM: CT HEAD WITHOUT CONTRAST TECHNIQUE: Contiguous axial images were obtained from the base of the skull through the vertex without intravenous contrast. RADIATION DOSE REDUCTION: This exam was performed according to the departmental dose-optimization program which includes automated exposure control, adjustment of the mA and/or kV according to patient size and/or use of iterative reconstruction technique. COMPARISON:  September 07, 2020 FINDINGS: Brain: There is generalized cerebral atrophy with widening of the extra-axial spaces and ventricular dilatation. There are areas of decreased attenuation within the white matter tracts of the supratentorial brain, consistent with  microvascular disease changes. Small, stable areas of bilateral frontal lobe encephalomalacia are seen. Vascular: Marked severity bilateral cavernous carotid artery calcification is noted. Skull: Normal. Negative for fracture or focal lesion. Sinuses/Orbits: No acute finding. Other: None. IMPRESSION: 1. Generalized cerebral atrophy and microvascular disease changes of the supratentorial brain. 2. Small, stable areas of bilateral frontal lobe encephalomalacia. 3. No acute intracranial abnormality. Electronically Signed   By: Virgle Grime M.D.   On: 07/04/2023 16:10   DG Ankle Complete Right Result Date: 07/04/2023 CLINICAL DATA:  injury.  Unwitnessed fall. EXAM: RIGHT ANKLE - COMPLETE 3+ VIEW COMPARISON:  None Available. FINDINGS: There is comminuted mildly displaced fracture of the lower metadiaphysis of the right fibula. There is also undisplaced/minimally displaced fracture of the medial malleolus. No other acute fracture or dislocation. No aggressive osseous lesion. Ankle mortise appears intact. Calcaneal spur noted along the Achilles tendon attachment site. No focal soft tissue swelling. No radiopaque foreign bodies. IMPRESSION: *Bimalleolar fracture, as described above. Electronically Signed   By: Beula Brunswick M.D.   On: 07/04/2023 15:47    Positive ROS: All other systems have been reviewed and were otherwise negative with the exception of those mentioned  in the HPI and as above.  12 point ROS was performed.  Physical Exam: General: Alert and oriented.  No apparent distress.  Vascular:  Left foot:Dorsalis Pedis:  present Posterior Tibial:  present  Right foot:  Per ER note palpable pulses  Neuro:intact gross sensation.  Somewhat diminished.  Derm: Per media pictures ecchymosis to medial lateral aspect of the ankle.  No open fracture.  Ortho/MS: Diffuse edema to the right foot.  Right foot and ankle in posterior splint at this time.  I personally reviewed the x-rays that shows a  trimalleolar ankle fracture.  I am ordering a CT scan just to further evaluate for surgical planning.  Assessment: Trimalleolar right ankle fracture Seizure disorder Peripheral neuropathy  Plan: Plan is for open reduction with internal fixation tomorrow.  Discussed the risk benefits alternatives and complications associated with surgery.  Consent was given verbally.  Orders will be placed at this time.    Brant Caldron, DPM Cell [phone]   07/04/2023 8:39 PM

## 2023-07-04 NOTE — ED Provider Notes (Signed)
 Jared Grimes Provider Note    Event Date/Time   First MD Initiated Contact with Patient 07/04/23 1543     (approximate)   History   Ankle Pain   HPI  Jared Grimes is a 58 y.o. male with history of TBI, seizures, hypertension, presenting with ankle pain.  Per EMS patient is coming in for an unwitnessed fall, fell 2 days ago, has bruising to the right ankle and foot.  Patient does not remember what happened, family had noted to them that he seemed a little bit more confused than baseline, their blood pressure was 128/84, heart rate was 93, room air satting 99%.  He is not any blood thinners.  Is on carbamazepine  for his seizures.  States that he thinks he was turning around, twisted his ankle and fell in the laundry room.  But does not remember clearly what happened.  Patient states that he lives at home alone.  Independent history obtained from EMS as above.  On independent chart review, he was seen by his primary care doctor in the member of last year, has history of epilepsy for she is followed by neuro, has also neuropathic pain and also on Cymbalta , history of insomnia, on trazodone .     Physical Exam   Triage Vital Signs: ED Triage Vitals  Encounter Vitals Group     BP 07/04/23 1515 113/83     Systolic BP Percentile --      Diastolic BP Percentile --      Pulse Rate 07/04/23 1515 95     Resp 07/04/23 1515 18     Temp 07/04/23 1515 98 F (36.7 C)     Temp Source 07/04/23 1515 Oral     SpO2 07/04/23 1515 100 %     Weight 07/04/23 1512 202 lb (91.6 kg)     Height 07/04/23 1512 5\' 8"  (1.727 m)     Head Circumference --      Peak Flow --      Pain Score 07/04/23 1512 5     Pain Loc --      Pain Education --      Exclude from Growth Chart --     Most recent vital signs: Vitals:   07/04/23 1730 07/04/23 1800  BP: (!) 130/93 (!) 145/96  Pulse: 87   Resp: 18   Temp:    SpO2: 95%      General: Awake, no distress.  Alert and oriented x  3 CV:  Good peripheral perfusion.  No thoracic cage tenderness Resp:  Normal effort.  No respiratory distress or tachypnea Abd:  No distention.  Soft nontender Other:  No palpable skull deformities or tenderness, no midline spinal tenderness, no facial deformities or tenderness, he has no bilateral upper extremity tenderness to palpation, equal radial pulses bilaterally.  No left lower extremity tenderness, full range of motion of bilateral upper extremities as well as left lower extremity is intact.  States that he has decreased sensation to his bilateral feet at baseline, sensation is unchanged compared to his baseline.  Able to wiggle his toes bilaterally.  He has dopplerable DP pulses bilaterally, his right ankle and foot are swollen with ecchymoses, his bilateral ankles as well as the base of his fifth metatarsal is tenderness to palpation, no tenderness to the dorsum of the foot, he also has tenderness to the medial proximal tibia, able to range his right hip and knee without tenderness, range of motion of his right ankle  is reduced.   ED Results / Procedures / Treatments   Labs (all labs ordered are listed, but only abnormal results are displayed) Labs Reviewed  BASIC METABOLIC PANEL WITH GFR - Abnormal; Notable for the following components:      Result Value   CO2 17 (*)    Anion gap 18 (*)    All other components within normal limits  CBC - Abnormal; Notable for the following components:   WBC 14.0 (*)    Hemoglobin 17.1 (*)    HCT 52.8 (*)    All other components within normal limits  BUN - Abnormal; Notable for the following components:   BUN 26 (*)    All other components within normal limits  CREATININE, SERUM  URINALYSIS, W/ REFLEX TO CULTURE (INFECTION SUSPECTED)  TROPONIN I (HIGH SENSITIVITY)  TROPONIN I (HIGH SENSITIVITY)     EKG  EKG shows, sinus rhythm, rate 85, normal QRS, normal QTc, no ischemic ST elevation, baseline is wandering but T wave flattening in aVL,  3, not significant change compared to prior   RADIOLOGY On my independent interpretation, CT head without obvious intracranial hemorrhage   PROCEDURES:  Critical Care performed: No  Procedures   MEDICATIONS ORDERED IN ED: Medications  morphine (PF) 4 MG/ML injection 4 mg (4 mg Intravenous Given 07/04/23 1640)  morphine (PF) 4 MG/ML injection 4 mg (4 mg Intravenous Given 07/04/23 1814)     IMPRESSION / MDM / ASSESSMENT AND PLAN / ED COURSE  I reviewed the triage vital signs and the nursing notes.                              Differential diagnosis includes, but is not limited to, contusion, strain, sprain, fracture, dislocation, intracranial hemorrhage, fall was unwitnessed, patient does not remember what happened, considered seizure, arrhythmia, electrolyte derangements, dehydration, UTI.  Will get labs, EKG, troponin, CT head and cervical spine, ankle, knee, tib-fib, foot x-rays, UA.  Patient's presentation is most consistent with acute presentation with potential threat to life or bodily function.  Independent interpretation of labs and imaging below.  Discussed with patient and sister about imaging as well as lab results, also discussed recommendations by Dr. Alfredo Ano from podiatry.  Given his pain and that he lives at home alone, discussed with patient and family and they are concerned about his safety at home given that he typically ambulates by himself and would not be able to do so with this fracture.  Will plan to admit him for pain control as well as likely ORIF with podiatry tomorrow.  They are agreeable with the plan.  Will consult hospitalist for admission.  Consulted hospitalist for admission and he is agreeable plan for admission and will evaluate the patient.  He is admitted.    Clinical Course as of 07/04/23 1850  Tue Jul 04, 2023  1608 DG Ankle Complete Right *Bimalleolar fracture, as described above.  [TT]  1620 CT Cervical Spine Wo Contrast IMPRESSION: Marked  severity multilevel degenerative changes, as described above, without evidence of an acute fracture or subluxation.   [TT]  1620 CT Head Wo Contrast IMPRESSION: 1. Generalized cerebral atrophy and microvascular disease changes of the supratentorial brain. 2. Small, stable areas of bilateral frontal lobe encephalomalacia. 3. No acute intracranial abnormality.   [TT]  1727 DG Foot Complete Right IMPRESSION: 1. Acute fracture of the distal fibula, mildly displaced. Recommend dedicated views of the ankle. 2. Soft tissue swelling  of the foot diffusely.   [TT]  1837 Consulted Dr. Althea Atkinson from podiatry who took a look at the images, put him on a schedule for an ORIF tomorrow. [TT]  1848 DG Tibia/Fibula Right IMPRESSION: 1. Acute displaced and comminuted transsyndesmotic distal fibular fracture. 2. Poorly visualized acute posterior malleolar fracture. 3. Cortical irregularity along the medial malleolus may represent an acute fracture. 4. Recommend dedicated three view radiograph of the right ankle. 5. Redemonstration of possible acute on chronic nondisplaced fibular head neck and proximal shaft fracture. Correlate with point tenderness to palpation.   [TT]  1848 DG Knee Complete 4 Views Right IMPRESSION: Question acute on chronic nondisplaced proximal fibular shaft fracture. Correlate with point tenderness to palpation to evaluate for an underlying acute component.   [TT]  [id number] Patient has no point tenderness to his proximal fibula. [TT]  1848 DG Foot Complete Right IMPRESSION: 1. Acute fracture of the distal fibula, mildly displaced. Recommend dedicated views of the ankle. 2. Soft tissue swelling of the foot diffusely.   [TT]  [id number] Independent review of labs, mild leukocytosis, electrolytes not severely deranged, troponin is negative, creatinine is normal. [TT]    Clinical Course User Index [TT] Drenda Gentle, Richard Champion, MD     FINAL CLINICAL IMPRESSION(S) / ED DIAGNOSES   Final  diagnoses:  Closed fracture of right ankle, initial encounter  Fall, initial encounter  Pain of right lower extremity  Right foot pain     Rx / DC Orders   ED Discharge Orders     None        Note:  This document was prepared using Dragon voice recognition software and may include unintentional dictation errors.    Shane Darling, MD 07/04/23 [phone]

## 2023-07-04 NOTE — H&P (Signed)
 History and Physical    KINKADE JACO WGN:[id number] DOB: 04/12/1965 DOA: 07/04/2023  Referring MD/NP/PA:   PCP: Eartha Gold, MD   Patient coming from:  The patient is coming from home.     Chief Complaint: fall, pain in right foot and ankle  HPI: Jared Grimes is a 58 y.o. male with medical history significant of TBI, s/p vagus nerve stimulator placement, hypertension, hyperlipidemia, stroke, depression, seizure, who presents with fall, pain in right foot and ankle.  Per his sister at the bedside, patient had an unwitnessed fall 2 days ago.  Patient was found on the floor.  Pt could have seizure per her sister.  Currently no seizure activity.  Patient does not have chest pain, cough, SOB.  No nausea, vomiting, diarrhea or abdominal pain.  No symptoms of UTI.  He injured his right ankle, complains of pain in right ankle and foot, which seem to be severe, constant, aggravated by movement.  Currently patient's mental status is at the baseline.  Data reviewed independently and ED Course: pt was found to have WBC 14.0, GFR> 60, temperature normal, blood pressure 145/96, heart rate 95, RR 18, oxygen saturation 95% on room air.  CT of head is negative for acute intracranial abnormalities.  CT of C-spine negative for acute injury, but showed degenerative disc disease.  Patient is admitted to MedSurg bed as inpatient.  Dr. Thomasena Fleming of Ortho is consulted.   X-ray of right knee: Question acute on chronic nondisplaced proximal fibular shaft fracture. Correlate with point tenderness to palpation to evaluate for an underlying acute component.  X-ray of right tibia and fibula 1. Acute displaced and comminuted transsyndesmotic distal fibular fracture. 2. Poorly visualized acute posterior malleolar fracture. 3. Cortical irregularity along the medial malleolus may represent an acute fracture. 4. Recommend dedicated three view radiograph of the right ankle. 5. Redemonstration of possible acute on  chronic nondisplaced fibular head neck and proximal shaft fracture. Correlate with point tenderness to palpation.  X-ray of right foot 1. Acute displaced and comminuted transsyndesmotic distal fibular fracture. 2. Poorly visualized acute posterior malleolar fracture. 3. Cortical irregularity along the medial malleolus may represent an acute fracture. 4. Recommend dedicated three view radiograph of the right ankle. 5. Redemonstration of possible acute on chronic nondisplaced fibular head neck and proximal shaft fracture. Correlate with point tenderness to palpation.  X-ray of right ankle *Bimalleolar fracture, as described above.  Pending CT scan of right ankle:   EKG: I have personally reviewed.  Sinus rhythm, QTc 404, poor R wave progression, anteroseptal infarction pattern   Review of Systems:   General: no fevers, chills, no body weight gain,  has fatigue HEENT: no blurry vision, hearing changes or sore throat Respiratory: no dyspnea, coughing, wheezing CV: no chest pain, no palpitations GI: no nausea, vomiting, abdominal pain, diarrhea, constipation GU: no dysuria, burning on urination, increased urinary frequency, hematuria  Ext: no leg edema Neuro: no unilateral weakness, numbness, or tingling, no vision change or hearing loss. Has fall. Skin: no rash, no skin tear. MSK: has pain in right foot an ankle Heme: No easy bruising.  Travel history: No recent long distant travel.   Allergy: No Known Allergies  Past Medical History:  Diagnosis Date   Hypertension    Seizures (HCC)    Stroke [facility])     Past Surgical History:  Procedure Laterality Date   COLONOSCOPY WITH PROPOFOL  N/A 10/22/2015   Procedure: COLONOSCOPY WITH PROPOFOL ;  Surgeon: Deveron Fly, MD;  Location:  [facility] ENDOSCOPY;  Service: Endoscopy;  Laterality: N/A;   COLONOSCOPY WITH PROPOFOL  N/A 01/19/2022   Procedure: COLONOSCOPY WITH PROPOFOL ;  Surgeon: Luke Salaam, MD;  Location[facility] ENDOSCOPY;   Service: Gastroenterology;  Laterality: N/A;   ESOPHAGOGASTRODUODENOSCOPY (EGD) WITH PROPOFOL  N/A 09/08/2020   Procedure: ESOPHAGOGASTRODUODENOSCOPY (EGD) WITH PROPOFOL ;  Surgeon: Luke Salaam, MD;  Location: [facility] ENDOSCOPY;  Service: Gastroenterology;  Laterality: N/A;   ESOPHAGOGASTRODUODENOSCOPY (EGD) WITH PROPOFOL  N/A 10/29/2020   Procedure: ESOPHAGOGASTRODUODENOSCOPY (EGD) WITH PROPOFOL ;  Surgeon: Luke Salaam, MD;  Location: [facility] ENDOSCOPY;  Service: Gastroenterology;  Laterality: N/A;   NERVE SURGERY     VAGUS NERVE STIMULATOR INSERTION N/A 02/03/2015   Procedure: VAGAL NERVE STIMULATOR IMPLANT LEFT SIDED;  Surgeon: Augusto Blonder, MD;  Location: [facility] NEURO ORS;  Service: Neurosurgery;  Laterality: N/A;    Social History:  reports that he has never smoked. He has never used smokeless tobacco. He reports that he does not drink alcohol and does not use drugs.  Family History: History reviewed. No pertinent family history.   Prior to Admission medications   Medication Sig Start Date End Date Taking? Authorizing Provider  amLODipine (NORVASC) 5 MG tablet Take 5 mg by mouth daily. 08/24/20   [provider]  atenolol (TENORMIN) 50 MG tablet Take 50 mg by mouth daily. 07/28/20   [provider]  atorvastatin  (LIPITOR) 40 MG tablet Take 1 tablet (40 mg total) by mouth at bedtime. 02/08/16   Vaickute, Rima, MD  betamethasone dipropionate 0.05 % cream Apply 1 application topically 2 (two) times daily as needed. 07/28/20   [provider]  Calcium  Carbonate-Vitamin D  600-200 MG-UNIT TABS Take 1 tablet by mouth 2 (two) times daily.    [provider]  carbamazepine  (TEGRETOL  XR) 200 MG 12 hr tablet Take 3 tablets (600 mg total) by mouth 2 (two) times daily. 800 mg every morning and 600 mg at bedtime 02/20/16   Patel, Sona, MD  cholecalciferol (VITAMIN D3) 25 MCG (1000 UNIT) tablet Take 1 tablet by mouth daily.    [provider]  Cyanocobalamin ([employer] B-12 PO)  Take 2 tablets by mouth daily.    [provider]  cyclobenzaprine (FLEXERIL) 10 MG tablet Take 10 mg by mouth 2 (two) times daily as needed. 08/13/20   [provider]  DULoxetine  (CYMBALTA ) 20 MG capsule Take 20 mg by mouth daily. 07/29/20   [provider]  lamoTRIgine  (LAMICTAL ) 150 MG tablet Take 300 mg by mouth 2 (two) times daily. 07/29/20   [provider]  losartan  (COZAAR ) 100 MG tablet Take 1 tablet (100 mg total) by mouth daily. 02/10/16   Vaickute, Rima, MD  losartan  (COZAAR ) 50 MG tablet Take 50 mg by mouth daily. 08/27/20   [provider]  pantoprazole  (PROTONIX ) 40 MG tablet Take 1 tablet (40 mg total) by mouth 2 (two) times daily. 09/10/20   Donaciano Frizzle, MD  traZODone  (DESYREL ) 100 MG tablet Take 200 mg by mouth at bedtime as needed. 07/28/20   [provider]    Physical Exam: Vitals:   07/04/23 1800 07/04/23 1830 07/04/23 1900 07/04/23 2024  BP: (!) 145/96 132/87 124/85 (!) 100/59  Pulse:  90 92 90  Resp:   18 18  Temp:   98 F (36.7 C) 97.7 F (36.5 C)  TempSrc:      SpO2:  100% 98% 99%  Weight:      Height:       General: Not in acute distress HEENT:  Eyes: PERRL, EOMI, no jaundice       ENT: No discharge from the ears and nose, no pharynx injection, no tonsillar enlargement.        Neck: No JVD, no bruit, no mass felt. Heme: No neck lymph node enlargement. Cardiac: S1/S2, RRR, No murmurs, No gallops or rubs. Respiratory: No rales, wheezing, rhonchi or rubs. GI: Soft, nondistended, nontender, no rebound pain, no organomegaly, BS present. GU: No hematuria Ext: No pitting leg edema bilaterally. 1+DP/PT pulse bilaterally. Musculoskeletal: has tenderness in right foot and ankle. Skin: No rashes.  Neuro: Alert, oriented X3, cranial nerves II-XII grossly intact, moves all extremities  Psych: Patient is not psychotic, no suicidal or hemocidal ideation.  Labs on Admission: I have personally reviewed following  labs and imaging studies  CBC: Recent Labs  Lab 07/04/23 1518  WBC 14.0*  HGB 17.1*  HCT 52.8*  MCV 95.8  PLT 358   Basic Metabolic Panel: Recent Labs  Lab 07/04/23 1518 07/04/23 1725  NA 136  --   K 4.9  --   CL 101  --   CO2 17*  --   GLUCOSE 95  --   BUN QUANTITY NOT SUFFICIENT, UNABLE TO PERFORM TEST 26*  CREATININE QUANTITY NOT SUFFICIENT, UNABLE TO PERFORM TEST 0.78  CALCIUM  9.5  --    GFR: Estimated Creatinine Clearance: 109.3 mL/min (by C-G formula based on SCr of 0.78 mg/dL). Liver Function Tests: No results for input(s): "AST", "ALT", "ALKPHOS", "BILITOT", "PROT", "ALBUMIN" in the last 168 hours. No results for input(s): "LIPASE", "AMYLASE" in the last 168 hours. No results for input(s): "AMMONIA" in the last 168 hours. Coagulation Profile: Recent Labs  Lab 07/04/23 2243  INR 1.1   Cardiac Enzymes: No results for input(s): "CKTOTAL", "CKMB", "CKMBINDEX", "TROPONINI" in the last 168 hours. BNP (last 3 results) No results for input(s): "PROBNP" in the last 8760 hours. HbA1C: No results for input(s): "HGBA1C" in the last 72 hours. CBG: No results for input(s): "GLUCAP" in the last 168 hours. Lipid Profile: No results for input(s): "CHOL", "HDL", "LDLCALC", "TRIG", "CHOLHDL", "LDLDIRECT" in the last 72 hours. Thyroid  Function Tests: No results for input(s): "TSH", "T4TOTAL", "FREET4", "T3FREE", "THYROIDAB" in the last 72 hours. Anemia Panel: No results for input(s): "VITAMINB12", "FOLATE", "FERRITIN", "TIBC", "IRON", "RETICCTPCT" in the last 72 hours. Urine analysis:    Component Value Date/Time   COLORURINE AMBER (A) 07/04/2023 1518   APPEARANCEUR HAZY (A) 07/04/2023 1518   LABSPEC 1.027 07/04/2023 1518   PHURINE 6.0 07/04/2023 1518   GLUCOSEU NEGATIVE 07/04/2023 1518   HGBUR NEGATIVE 07/04/2023 1518   BILIRUBINUR NEGATIVE 07/04/2023 1518   KETONESUR 5 (A) 07/04/2023 1518   PROTEINUR 100 (A) 07/04/2023 1518   NITRITE NEGATIVE 07/04/2023 1518    LEUKOCYTESUR NEGATIVE 07/04/2023 1518   Sepsis Labs: @LABRCNTIP (procalcitonin:4,lacticidven:4) ) Recent Results (from the past 240 hours)  MRSA Next Gen by PCR, Nasal     Status: None   Collection Time: 07/04/23 10:14 PM   Specimen: Nasal Mucosa; Nasal Swab  Result Value Ref Range Status   MRSA by PCR Next Gen NOT DETECTED NOT DETECTED Final    Comment: (NOTE) The GeneXpert MRSA Assay (FDA approved for NASAL specimens only), is one component of a comprehensive MRSA colonization surveillance program. It is not intended to diagnose MRSA infection nor to guide or monitor treatment for MRSA infections. Test performance is not FDA approved in patients less than 92 years old. Performed at [facility], [address] [place], [place]  [address]      Radiological Exams on Admission:   Assessment/Plan Principal Problem:   Closed right ankle fracture Active Problems:   Fall at home, initial encounter   Seizure disorder [facility])   History of TIA (transient ischemic attack)   Depression   Obesity (BMI 30-39.9)   Assessment and Plan:  Closed right ankle fracture: as shown by X-rays above. Pending CT-scan of right ankle now. Consulted Dr. Althea Atkinson of ortho.   - will admit to Med-surg bed as inpt - Pain control: prn morphine, percocet and tyleno - When necessary Zofran  for nausea - Robaxin for muscle spasm - Lidoderm  patch for pain - type and cross - INR/PTT - Splint is placed to right leg in ED  Fall -PT/OT when able to (not ordered now) -check CK level  Leukocytosis: WBC 14.0 Likely due to stress-induced demargination. Patient does not have signs of infection. -Follow-up CBC  Seizure disorder (HCC) - Seizure precaution - As needed Ativan for seizure - Continue home Tegretol  600 mg twice daily and Lamictal  300 mg twice daily  History of TIA (transient ischemic attack) -Lipitor  Hypertension: - Amlodipine, atenolol, Cozaar  - As needed IV  hydralazine   Depression -Continue home medications  Obesity (BMI 30-39.9): Body weight 95.6 kg, BMI 30.71 - Encourage to lose some weight - Exercise and healthy diet        DVT ppx: SCd  Code Status: Full code   Family Communication:     not done, no family member is at bed side.   Disposition Plan:  Anticipate discharge back to previous environment,need rehab  Consults called:  Dr. Thomasena Fleming of Ortho is consulted.  Admission status and Level of care: Med-Surg: as inpt        Dispo: The patient is from: Home              Anticipated d/c is to:  need rehab              Anticipated d/c date is: 2 days              Patient currently is not medically stable to d/c.    Severity of Illness:  The appropriate patient status for this patient is INPATIENT. Inpatient status is judged to be reasonable and necessary in order to provide the required intensity of service to ensure the patient's safety. The patient's presenting symptoms, physical exam findings, and initial radiographic and laboratory data in the context of their chronic comorbidities is felt to place them at high risk for further clinical deterioration. Furthermore, it is not anticipated that the patient will be medically stable for discharge from the hospital within 2 midnights of admission.   * I certify that at the point of admission it is my clinical judgment that the patient will require inpatient hospital care spanning beyond 2 midnights from the point of admission due to high intensity of service, high risk for further deterioration and high frequency of surveillance required.*       Date of Service 07/05/2023    Fidencio Hue [facility]   If 7PM-7AM, please contact night-coverage [email or web address] 07/05/2023, 2:17 AM

## 2023-07-04 NOTE — ED Triage Notes (Signed)
 First Nurse Note: Patient to ED via [facility] from home for a fall. Unwitnessed fall. Discoloration and swelling to right foot. Found by family this AM. Confused from baseline per family. Hx of TBI  128/84 99% RA 93 HR

## 2023-07-04 NOTE — ED Notes (Signed)
 In CT

## 2023-07-05 ENCOUNTER — Other Ambulatory Visit: Payer: Self-pay

## 2023-07-05 ENCOUNTER — Inpatient Hospital Stay: Admitting: Anesthesiology

## 2023-07-05 ENCOUNTER — Encounter
Admission: EM | Disposition: A | Discharge status: Skilled Nursing Facility | Payer: Self-pay | Source: Home / Self Care | Attending: Student

## 2023-07-05 ENCOUNTER — Inpatient Hospital Stay

## 2023-07-05 DIAGNOSIS — S82891A Other fracture of right lower leg, initial encounter for closed fracture: Secondary | ICD-10-CM | POA: Diagnosis not present

## 2023-07-05 HISTORY — PX: ORIF ANKLE FRACTURE: SHX5408

## 2023-07-05 LAB — CBC
HCT: 42.4 % (ref 39.0–52.0)
Hemoglobin: 14.1 g/dL (ref 13.0–17.0)
MCH: 31.1 pg (ref 26.0–34.0)
MCHC: 33.3 g/dL (ref 30.0–36.0)
MCV: 93.4 fL (ref 80.0–100.0)
Platelets: 307 10*3/uL (ref 150–400)
RBC: 4.54 MIL/uL (ref 4.22–5.81)
RDW: 13.1 % (ref 11.5–15.5)
WBC: 10.7 10*3/uL — ABNORMAL HIGH (ref 4.0–10.5)
nRBC: 0 % (ref 0.0–0.2)

## 2023-07-05 LAB — BASIC METABOLIC PANEL WITH GFR
Anion gap: 7 (ref 5–15)
BUN: 32 mg/dL — ABNORMAL HIGH (ref 6–20)
CO2: 28 mmol/L (ref 22–32)
Calcium: 8.9 mg/dL (ref 8.9–10.3)
Chloride: 97 mmol/L — ABNORMAL LOW (ref 98–111)
Creatinine, Ser: 1.45 mg/dL — ABNORMAL HIGH (ref 0.61–1.24)
GFR, Estimated: 56 mL/min — ABNORMAL LOW (ref >60)
Glucose, Bld: 100 mg/dL — ABNORMAL HIGH (ref 70–99)
Potassium: 4 mmol/L (ref 3.5–5.1)
Sodium: 132 mmol/L — ABNORMAL LOW (ref 135–145)

## 2023-07-05 LAB — CK: Total CK: 261 U/L (ref 49–397)

## 2023-07-05 LAB — HIV ANTIBODY (ROUTINE TESTING W REFLEX): HIV Screen 4th Generation wRfx: NONREACTIVE

## 2023-07-05 SURGERY — OPEN REDUCTION INTERNAL FIXATION (ORIF) ANKLE FRACTURE
Anesthesia: General | Site: Ankle | Laterality: Right

## 2023-07-05 MED ORDER — LIDOCAINE HCL (CARDIAC) PF 100 MG/5ML IV SOSY
PREFILLED_SYRINGE | INTRAVENOUS | Status: DC | PRN
  Administered 2023-07-05: 100 mg via INTRAVENOUS

## 2023-07-05 MED ORDER — LACTATED RINGERS IV SOLN: INTRAVENOUS | Status: DC | PRN

## 2023-07-05 MED ORDER — HYDRALAZINE HCL 20 MG/ML IJ SOLN: 10.0000 mg | INTRAMUSCULAR | Status: DC | PRN

## 2023-07-05 MED ORDER — ACETAMINOPHEN 10 MG/ML IV SOLN
INTRAVENOUS | Status: AC
  Filled 2023-07-05: qty 100

## 2023-07-05 MED ORDER — LOSARTAN POTASSIUM 50 MG PO TABS: 50.0000 mg | ORAL_TABLET | Freq: Every day | ORAL | Status: DC

## 2023-07-05 MED ORDER — ATENOLOL 25 MG PO TABS
50.0000 mg | ORAL_TABLET | Freq: Every day | ORAL | Status: DC
  Administered 2023-07-06 – 2023-07-17 (×12): 50 mg via ORAL
  Filled 2023-07-05 (×13): qty 2

## 2023-07-05 MED ORDER — DEXAMETHASONE SODIUM PHOSPHATE 10 MG/ML IJ SOLN
INTRAMUSCULAR | Status: AC
  Filled 2023-07-05: qty 1

## 2023-07-05 MED ORDER — BUPIVACAINE HCL (PF) 0.25 % IJ SOLN
INTRAMUSCULAR | Status: AC
  Filled 2023-07-05: qty 30

## 2023-07-05 MED ORDER — TAMSULOSIN HCL 0.4 MG PO CAPS
0.4000 mg | ORAL_CAPSULE | Freq: Every day | ORAL | Status: DC
  Administered 2023-07-05 – 2023-07-17 (×13): 0.4 mg via ORAL
  Filled 2023-07-05 (×13): qty 1

## 2023-07-05 MED ORDER — PHENYLEPHRINE HCL-NACL 20-0.9 MG/250ML-% IV SOLN
INTRAVENOUS | Status: DC | PRN
  Administered 2023-07-05: 80 ug/min via INTRAVENOUS

## 2023-07-05 MED ORDER — BUPIVACAINE LIPOSOME 1.3 % IJ SUSP
INTRAMUSCULAR | Status: DC | PRN
  Administered 2023-07-05: 30 mL via INTRAMUSCULAR

## 2023-07-05 MED ORDER — FENTANYL CITRATE (PF) 100 MCG/2ML IJ SOLN
INTRAMUSCULAR | Status: DC | PRN
Start: 2023-07-05 — End: 2023-07-05
  Administered 2023-07-05 (×2): 50 ug via INTRAVENOUS

## 2023-07-05 MED ORDER — ADULT MULTIVITAMIN W/MINERALS CH
1.0000 | ORAL_TABLET | Freq: Every day | ORAL | Status: DC
  Administered 2023-07-06 – 2023-07-18 (×13): 1 via ORAL
  Filled 2023-07-05 (×13): qty 1

## 2023-07-05 MED ORDER — PHENYLEPHRINE 80 MCG/ML (10ML) SYRINGE FOR IV PUSH (FOR BLOOD PRESSURE SUPPORT)
PREFILLED_SYRINGE | INTRAVENOUS | Status: DC | PRN
  Administered 2023-07-05: 160 ug via INTRAVENOUS
  Administered 2023-07-05: 80 ug via INTRAVENOUS
  Administered 2023-07-05: 160 ug via INTRAVENOUS

## 2023-07-05 MED ORDER — BUPIVACAINE LIPOSOME 1.3 % IJ SUSP
INTRAMUSCULAR | Status: AC
  Filled 2023-07-05: qty 20

## 2023-07-05 MED ORDER — CEFAZOLIN SODIUM-DEXTROSE 2-4 GM/100ML-% IV SOLN
INTRAVENOUS | Status: AC
  Filled 2023-07-05: qty 100

## 2023-07-05 MED ORDER — LIDOCAINE HCL (PF) 1 % IJ SOLN
INTRAMUSCULAR | Status: AC
  Filled 2023-07-05: qty 30

## 2023-07-05 MED ORDER — AMLODIPINE BESYLATE 5 MG PO TABS
5.0000 mg | ORAL_TABLET | Freq: Every day | ORAL | Status: DC
Start: 2023-07-06 — End: 2023-07-18
  Administered 2023-07-06 – 2023-07-18 (×12): 5 mg via ORAL
  Filled 2023-07-05 (×13): qty 1

## 2023-07-05 MED ORDER — OXYCODONE HCL 5 MG PO TABS: 5.0000 mg | ORAL_TABLET | Freq: Once | ORAL | Status: DC | PRN

## 2023-07-05 MED ORDER — ROCURONIUM BROMIDE 100 MG/10ML IV SOLN
INTRAVENOUS | Status: DC | PRN
  Administered 2023-07-05: 70 mg via INTRAVENOUS

## 2023-07-05 MED ORDER — ONDANSETRON HCL 4 MG/2ML IJ SOLN
INTRAMUSCULAR | Status: DC | PRN
  Administered 2023-07-05: 4 mg via INTRAVENOUS

## 2023-07-05 MED ORDER — FENTANYL CITRATE (PF) 100 MCG/2ML IJ SOLN
INTRAMUSCULAR | Status: AC
Start: 2023-07-05 — End: ?
  Filled 2023-07-05: qty 2

## 2023-07-05 MED ORDER — HYDRALAZINE HCL 50 MG PO TABS
50.0000 mg | ORAL_TABLET | Freq: Four times a day (QID) | ORAL | Status: DC | PRN
  Administered 2023-07-10: 50 mg via ORAL
  Filled 2023-07-05: qty 1

## 2023-07-05 MED ORDER — FENTANYL CITRATE (PF) 100 MCG/2ML IJ SOLN: 25.0000 ug | INTRAMUSCULAR | Status: DC | PRN

## 2023-07-05 MED ORDER — 0.9 % SODIUM CHLORIDE (POUR BTL) OPTIME
TOPICAL | Status: DC | PRN
Start: 2023-07-05 — End: 2023-07-05
  Administered 2023-07-05: 500 mL

## 2023-07-05 MED ORDER — ACETAMINOPHEN 10 MG/ML IV SOLN
INTRAVENOUS | Status: DC | PRN
Start: 2023-07-05 — End: 2023-07-05
  Administered 2023-07-05: 1000 mg via INTRAVENOUS

## 2023-07-05 MED ORDER — BUPIVACAINE HCL (PF) 0.5 % IJ SOLN
INTRAMUSCULAR | Status: AC
  Filled 2023-07-05: qty 30

## 2023-07-05 MED ORDER — PHENYLEPHRINE 80 MCG/ML (10ML) SYRINGE FOR IV PUSH (FOR BLOOD PRESSURE SUPPORT)
PREFILLED_SYRINGE | INTRAVENOUS | Status: AC
  Filled 2023-07-05: qty 10

## 2023-07-05 MED ORDER — SUGAMMADEX SODIUM 200 MG/2ML IV SOLN
INTRAVENOUS | Status: DC | PRN
Start: 2023-07-05 — End: 2023-07-05
  Administered 2023-07-05: 200 mg via INTRAVENOUS

## 2023-07-05 MED ORDER — ONDANSETRON HCL 4 MG/2ML IJ SOLN
INTRAMUSCULAR | Status: AC
  Filled 2023-07-05: qty 2

## 2023-07-05 MED ORDER — ENSURE ENLIVE PO LIQD
237.0000 mL | Freq: Three times a day (TID) | ORAL | Status: DC
  Administered 2023-07-05 – 2023-07-16 (×19): 237 mL via ORAL
  Filled 2023-07-05: qty 237

## 2023-07-05 MED ORDER — OXYCODONE HCL 5 MG/5ML PO SOLN: 5.0000 mg | Freq: Once | ORAL | Status: DC | PRN

## 2023-07-05 MED ORDER — PROPOFOL 10 MG/ML IV BOLUS
INTRAVENOUS | Status: AC
  Filled 2023-07-05: qty 20

## 2023-07-05 MED ORDER — MIDAZOLAM HCL 2 MG/2ML IJ SOLN
INTRAMUSCULAR | Status: AC
  Filled 2023-07-05: qty 2

## 2023-07-05 MED ORDER — MIDAZOLAM HCL 2 MG/2ML IJ SOLN
INTRAMUSCULAR | Status: DC | PRN
  Administered 2023-07-05: 2 mg via INTRAVENOUS

## 2023-07-05 MED ORDER — LOSARTAN POTASSIUM 50 MG PO TABS: 150.0000 mg | ORAL_TABLET | Freq: Every day | ORAL | Status: DC

## 2023-07-05 MED ORDER — PHENYLEPHRINE HCL-NACL 20-0.9 MG/250ML-% IV SOLN
INTRAVENOUS | Status: AC
  Filled 2023-07-05: qty 250

## 2023-07-05 MED ORDER — PROPOFOL 10 MG/ML IV BOLUS
INTRAVENOUS | Status: DC | PRN
  Administered 2023-07-05: 90 mg via INTRAVENOUS

## 2023-07-05 MED ORDER — SODIUM CHLORIDE 0.9 % IV SOLN: INTRAVENOUS | Status: AC

## 2023-07-05 MED ORDER — ROCURONIUM BROMIDE 10 MG/ML (PF) SYRINGE
PREFILLED_SYRINGE | INTRAVENOUS | Status: AC
  Filled 2023-07-05: qty 10

## 2023-07-05 MED ORDER — DEXAMETHASONE SODIUM PHOSPHATE 10 MG/ML IJ SOLN
INTRAMUSCULAR | Status: DC | PRN
  Administered 2023-07-05: 10 mg via INTRAVENOUS

## 2023-07-05 SURGICAL SUPPLY — 56 items
ANCH SUT GRAPPLER R3FLEX 2H (Anchor) IMPLANT
BENZOIN TINCTURE PRP APPL 2/3 (GAUZE/BANDAGES/DRESSINGS) IMPLANT
BIT DRILL 2.0X130 SLD AO (BIT) IMPLANT
BIT DRILL CANN LONG 4.6X220 (DRILL) IMPLANT
BLADE SURG 15 STRL LF DISP TIS (BLADE) IMPLANT
BNDG COHESIVE 4X5 TAN STRL LF (GAUZE/BANDAGES/DRESSINGS) ×1 IMPLANT
BNDG ELASTIC 4X5.8 VLCR NS LF (GAUZE/BANDAGES/DRESSINGS) ×1 IMPLANT
BNDG ESMARCH 4X12 STRL LF (GAUZE/BANDAGES/DRESSINGS) ×1 IMPLANT
BNDG GAUZE DERMACEA FLUFF 4 (GAUZE/BANDAGES/DRESSINGS) ×1 IMPLANT
BNDG STRETCH GAUZE 3IN X12FT (GAUZE/BANDAGES/DRESSINGS) ×1 IMPLANT
CUFF TOURN SGL QUICK 18X4 (TOURNIQUET CUFF) IMPLANT
CUFF TRNQT CYL 24X4X16.5-23 (TOURNIQUET CUFF) IMPLANT
DRAPE C-ARM XRAY 36X54 (DRAPES) ×1 IMPLANT
DRAPE C-ARMOR (DRAPES) ×1 IMPLANT
DURAPREP 26ML APPLICATOR (WOUND CARE) ×1 IMPLANT
ELECTRODE REM PT RTRN 9FT ADLT (ELECTROSURGICAL) ×1 IMPLANT
GAUZE SPONGE 4X4 12PLY STRL (GAUZE/BANDAGES/DRESSINGS) ×1 IMPLANT
GAUZE STRETCH 2X75IN STRL (MISCELLANEOUS) ×1 IMPLANT
GAUZE XEROFORM 1X8 LF (GAUZE/BANDAGES/DRESSINGS) ×1 IMPLANT
GLOVE BIO SURGEON STRL SZ7.5 (GLOVE) ×1 IMPLANT
GLOVE INDICATOR 8.0 STRL GRN (GLOVE) ×1 IMPLANT
GOWN STRL REUS W/ TWL XL LVL3 (GOWN DISPOSABLE) ×1 IMPLANT
GOWN STRL REUS W/TWL XL LVL4 (GOWN DISPOSABLE) ×1 IMPLANT
KIT TURNOVER KIT A (KITS) ×1 IMPLANT
LABEL OR SOLS (LABEL) ×1 IMPLANT
MANIFOLD NEPTUNE II (INSTRUMENTS) ×1 IMPLANT
NDL HYPO 22X1.5 SAFETY MO (MISCELLANEOUS) ×1 IMPLANT
NEEDLE HYPO 22X1.5 SAFETY MO (MISCELLANEOUS) ×1 IMPLANT
NS IRRIG 500ML POUR BTL (IV SOLUTION) ×1 IMPLANT
PACK EXTREMITY ARMC (MISCELLANEOUS) ×1 IMPLANT
PAD PREP OB/GYN DISP 24X41 (PERSONAL CARE ITEMS) ×1 IMPLANT
PENCIL SMOKE EVACUATOR (MISCELLANEOUS) ×1 IMPLANT
PLATE FIB 11H ANATOMICAL RT (Plate) IMPLANT
PLATE MEDIAL MALLEOLUS 2H RT (Plate) IMPLANT
SCREW 3.5X26 NONLOCKING (Screw) IMPLANT
SCREW LOCK PLATE R3 2.7X10 (Screw) IMPLANT
SCREW LOCK PLATE R3 2.7X11 (Screw) IMPLANT
SCREW LOCK PLATE R3 2.7X12 (Screw) IMPLANT
SCREW LOCK PLATE R3 2.7X13 (Screw) IMPLANT
SCREW LOCK PLATE R3 2.7X15 (Screw) IMPLANT
SCREW LOCK PLATE R3 3.5X28 (Screw) IMPLANT
SPLINT CAST 1 STEP 4X30 (MISCELLANEOUS) ×1 IMPLANT
SPLINT PLASTER CAST FAST 5X30 (CAST SUPPLIES) ×1 IMPLANT
SPONGE T-LAP 18X18 ~~LOC~~+RFID (SPONGE) ×1 IMPLANT
STAPLER SKIN PROX 35W (STAPLE) ×1 IMPLANT
STOCKINETTE M/LG 89821 (MISCELLANEOUS) ×1 IMPLANT
STRAP SAFETY 5IN WIDE (MISCELLANEOUS) ×1 IMPLANT
STRIP CLOSURE SKIN 1/2X4 (GAUZE/BANDAGES/DRESSINGS) IMPLANT
SUT VIC AB 2-0 CT1 TAPERPNT 27 (SUTURE) ×1 IMPLANT
SUT VIC AB 3-0 SH 27X BRD (SUTURE) ×1 IMPLANT
SYR 10ML LL (SYRINGE) ×1 IMPLANT
SYR 50ML LL SCALE MARK (SYRINGE) ×1 IMPLANT
TRAP FLUID SMOKE EVACUATOR (MISCELLANEOUS) ×1 IMPLANT
WATER STERILE IRR 500ML POUR (IV SOLUTION) ×1 IMPLANT
WIRE OLIVE SMOOTH 1.4MMX60MM (WIRE) IMPLANT
WIRE OLIVE SMOOTH 1.6X100 LNG (WIRE) IMPLANT

## 2023-07-05 NOTE — Anesthesia Preprocedure Evaluation (Signed)
 Anesthesia Evaluation  Patient identified by MRN, date of birth, ID band Patient awake    Reviewed: Allergy & Precautions, NPO status , Patient's Chart, lab work & pertinent test results  History of Anesthesia Complications Negative for: history of anesthetic complications  Airway Mallampati: III  TM Distance: >3 FB Neck ROM: full    Dental  (+) Poor Dentition   Pulmonary neg pulmonary ROS   Pulmonary exam normal        Cardiovascular hypertension, On Medications negative cardio ROS Normal cardiovascular exam     Neuro/Psych Seizures -, Poorly Controlled,  PSYCHIATRIC DISORDERS  Depression    TBI, s/p vagus nerve stimulator placement TIACVA, Residual Symptoms    GI/Hepatic negative GI ROS, Neg liver ROS,,,  Endo/Other  negative endocrine ROS    Renal/GU      Musculoskeletal   Abdominal   Peds  Hematology  (+) Blood dyscrasia, anemia   Anesthesia Other Findings Past Medical History: No date: Hypertension No date: Seizures (HCC) No date: Stroke [facility])  Past Surgical History: 10/22/2015: COLONOSCOPY WITH PROPOFOL ; N/A     Comment:  Procedure: COLONOSCOPY WITH PROPOFOL ;  Surgeon: Deveron Fly, MD;  Location: [facility] ENDOSCOPY;  Service:               Endoscopy;  Laterality: N/A; 01/19/2022: COLONOSCOPY WITH PROPOFOL ; N/A     Comment:  Procedure: COLONOSCOPY WITH PROPOFOL ;  Surgeon: Luke Salaam, MD;  Location: [facility] ENDOSCOPY;  Service:               Gastroenterology;  Laterality: N/A; 09/08/2020: ESOPHAGOGASTRODUODENOSCOPY (EGD) WITH PROPOFOL ; N/A     Comment:  Procedure: ESOPHAGOGASTRODUODENOSCOPY (EGD) WITH               PROPOFOL ;  Surgeon: Luke Salaam, MD;  Location: [facility]               ENDOSCOPY;  Service: Gastroenterology;  Laterality: N/A; 10/29/2020: ESOPHAGOGASTRODUODENOSCOPY (EGD) WITH PROPOFOL ; N/A     Comment:  Procedure: ESOPHAGOGASTRODUODENOSCOPY (EGD) WITH                PROPOFOL ;  Surgeon: Luke Salaam, MD;  Location: [facility]               ENDOSCOPY;  Service: Gastroenterology;  Laterality: N/A; No date: NERVE SURGERY 02/03/2015: VAGUS NERVE STIMULATOR INSERTION; N/A     Comment:  Procedure: VAGAL NERVE STIMULATOR IMPLANT LEFT SIDED;                Surgeon: Augusto Blonder, MD;  Location: [facility] NEURO ORS;               Service: Neurosurgery;  Laterality: N/A;  BMI    Body Mass Index: 30.71 kg/m      Reproductive/Obstetrics negative OB ROS                             Anesthesia Physical Anesthesia Plan  ASA: 3  Anesthesia Plan: General ETT   Post-op Pain Management: Toradol IV (intra-op)*, Ofirmev  IV (intra-op)* and Regional block*   Induction: Intravenous  PONV Risk Score and Plan: 2 and Ondansetron , Dexamethasone and Treatment may vary due to age or medical condition  Airway Management Planned: Oral ETT  Additional Equipment:   Intra-op Plan:   Post-operative Plan: Extubation in OR  Informed Consent: I have reviewed the patients History and Physical, chart, labs and discussed the procedure including the risks, benefits and alternatives for the proposed anesthesia with the patient or authorized representative who has indicated his/her understanding and acceptance.     Dental Advisory Given  Plan Discussed with: Anesthesiologist, CRNA and Surgeon  Anesthesia Plan Comments: (Patient consented for risks of anesthesia including but not limited to:  - adverse reactions to medications - damage to eyes, teeth, lips or other oral mucosa - nerve damage due to positioning  - sore throat or hoarseness - Damage to heart, brain, nerves, lungs, other parts of body or loss of life  Patient voiced understanding and assent.)       Anesthesia Quick Evaluation

## 2023-07-05 NOTE — Transfer of Care (Signed)
 Immediate Anesthesia Transfer of Care Note  Patient: Jared Grimes  Procedure(s) Performed: OPEN REDUCTION INTERNAL FIXATION (ORIF) ANKLE FRACTURE (Right: Ankle)  Patient Location: PACU  Anesthesia Type:General  Level of Consciousness: drowsy  Airway & Oxygen Therapy: Patient Spontanous Breathing and Patient connected to face mask oxygen  Post-op Assessment: Report given to RN and Post -op Vital signs reviewed and stable  Post vital signs: Reviewed and stable  Last Vitals:  Vitals Value Taken Time  BP 101/55 07/05/23 1601  Temp 36.8 1601  Pulse 79 07/05/23 1604  Resp 18 07/05/23 1604  SpO2 100 % 07/05/23 1604  Vitals shown include unfiled device data.  Last Pain:  Vitals:   07/05/23 1332  TempSrc: Temporal  PainSc:       Patients Stated Pain Goal: 2 (07/05/23 1000)  Complications: No notable events documented.

## 2023-07-05 NOTE — Anesthesia Procedure Notes (Signed)
 Procedure Name: Intubation Date/Time: 07/05/2023 2:10 PM  Performed by: Ellwood Haber, CRNAPre-anesthesia Checklist: Patient identified, Patient being monitored, Timeout performed, Emergency Drugs available and Suction available Patient Re-evaluated:Patient Re-evaluated prior to induction Oxygen Delivery Method: Circle system utilized Preoxygenation: Pre-oxygenation with 100% oxygen Induction Type: IV induction Ventilation: Mask ventilation without difficulty and Oral airway inserted - appropriate to patient size Laryngoscope Size: 3 and McGrath Grade View: Grade I Tube type: Oral Tube size: 7.0 mm Number of attempts: 1 Airway Equipment and Method: Stylet Placement Confirmation: ETT inserted through vocal cords under direct vision, positive ETCO2 and breath sounds checked- equal and bilateral Secured at: 22 cm Tube secured with: Tape Dental Injury: Teeth and Oropharynx as per pre-operative assessment  Comments: Smooth atraumatic intubation, no complications noted.

## 2023-07-05 NOTE — Anesthesia Postprocedure Evaluation (Signed)
 Anesthesia Post Note  Patient: OLAV ZAWADA  Procedure(s) Performed: OPEN REDUCTION INTERNAL FIXATION (ORIF) ANKLE FRACTURE (Right: Ankle)  Patient location during evaluation: PACU Anesthesia Type: General Level of consciousness: awake and alert Pain management: pain level controlled Vital Signs Assessment: post-procedure vital signs reviewed and stable Respiratory status: spontaneous breathing, nonlabored ventilation and respiratory function stable Cardiovascular status: blood pressure returned to baseline and stable Postop Assessment: no apparent nausea or vomiting Anesthetic complications: no   No notable events documented.   Last Vitals:  Vitals:   07/05/23 1646 07/05/23 1739  BP: 104/85 103/61  Pulse: (!) 102 90  Resp: (!) 21 16  Temp: 37.1 C 36.4 C  SpO2: 97% 97%    Last Pain:  Vitals:   07/05/23 1739  TempSrc: Oral  PainSc: 0-No pain                 Baltazar Bonier

## 2023-07-05 NOTE — Progress Notes (Signed)
 [facility] Progress Note  Patient: Jared Grimes    ZOX:[id number]  DOA: 07/04/2023     Date of Service: the patient was seen and examined on 07/05/2023  Chief Complaint  Patient presents with   Ankle Pain   Brief hospital course: Jared Grimes is a 58 y.o. male with medical history significant of TBI, s/p vagus nerve stimulator placement, hypertension, hyperlipidemia, stroke, depression, seizure, who presents with fall, pain in right foot and ankle.   Per his sister at the bedside, patient had an unwitnessed fall 2 days ago.  Patient was found on the floor.  Pt could have seizure per her sister.  Currently no seizure activity.  Patient does not have chest pain, cough, SOB.  No nausea, vomiting, diarrhea or abdominal pain.  No symptoms of UTI.  He injured his right ankle, complains of pain in right ankle and foot, which seem to be severe, constant, aggravated by movement.  Currently patient's mental status is at the baseline.   Data reviewed independently and ED Course: pt was found to have WBC 14.0, GFR> 60, temperature normal, blood pressure 145/96, heart rate 95, RR 18, oxygen saturation 95% on room air.  CT of head is negative for acute intracranial abnormalities.  CT of C-spine negative for acute injury, but showed degenerative disc disease.  Patient is admitted to MedSurg bed as inpatient.  Dr. Thomasena Fleming of Ortho is consulted.     X-ray of right knee: Question acute on chronic nondisplaced proximal fibular shaft fracture. Correlate with point tenderness to palpation to evaluate for an underlying acute component.   X-ray of right tibia and fibula 1. Acute displaced and comminuted transsyndesmotic distal fibular fracture. 2. Poorly visualized acute posterior malleolar fracture. 3. Cortical irregularity along the medial malleolus may represent an acute fracture. 4. Recommend dedicated three view radiograph of the right ankle. 5. Redemonstration of possible acute on chronic  nondisplaced fibular head neck and proximal shaft fracture. Correlate with point tenderness to palpation.   X-ray of right foot: 1. Acute displaced and comminuted transsyndesmotic distal fibular fracture. 2. Poorly visualized acute posterior malleolar fracture. 3. Cortical irregularity along the medial malleolus may represent an acute fracture. 4. Recommend dedicated three view radiograph of the right ankle. 5. Redemonstration of possible acute on chronic nondisplaced fibular head neck and proximal shaft fracture. Correlate with point tenderness to palpation.   X-ray of right ankle *Bimalleolar fracture, as described above.   Pending CT scan of right ankle:     EKG: I have personally reviewed.  Sinus rhythm, QTc 404, poor R wave progression, anteroseptal infarction pattern   Assessment and Plan:  # Closed right ankle fracture: as shown by X-rays above. Pending CT-scan of right ankle now.   - Pain control: prn morphine, percocet and tyleno - When necessary Zofran  for nausea - Robaxin for muscle spasm - Lidoderm  patch for pain - type and cross - INR/PTT - Splint is placed to right leg in ED Consulted Dr. Althea Atkinson of ortho. NPO since MN for ORIF today   AKI Cr 0.78---1.45 elevated  Started IV fluid for hydration Bladder scan showed urinary retention, cath was done. Patient may need Foley catheter if no improvement Started Flomax   Fall -PT/OT when able to (not ordered now) -CK level 261 wnl   Leukocytosis: WBC 14.0 Likely due to stress-induced demargination. Patient does not have signs of infection. -Follow-up CBC   Seizure disorder (HCC) - Seizure precaution - As needed Ativan for seizure - Continue home  Tegretol  600 mg twice daily and Lamictal  300 mg twice daily   History of TIA (transient ischemic attack) -Lipitor   Hypertension: - Amlodipine, atenolol, Cozaar  - As needed IV hydralazine    Depression -Continue home medications   Obesity (BMI 30-39.9): Body  weight 95.6 kg, BMI 30.71 - Encourage to lose some weight - Exercise and healthy diet     Body mass index is 30.71 kg/m.  Nutrition Problem: Increased nutrient needs Etiology: post-op healing Interventions:  Diet: NPO since MN DVT Prophylaxis: SCD, pharmacological prophylaxis contraindicated due to needs ORIF    Advance goals of care discussion: Full code  Family Communication: family was present at bedside, at the time of interview.  The pt provided permission to discuss medical plan with the family. Opportunity was given to ask question and all questions were answered satisfactorily.   Disposition:  Pt is from home, admitted with right ankle fracture, scheduled follow-up for 5/7, which precludes a safe discharge. Discharge to home w/HH vs SNF TBD after PT/OT eval, when stable.  Subjective: No significant events overnight.  Patient denies any pain in the right ankle area, resting comfortably, patient does not want Foley catheter insertion but he does agree for I/o catheter.   Physical Exam: General: NAD, lying comfortably Appear in no distress, affect appropriate Eyes: PERRLA ENT: Oral Mucosa Clear, moist  Neck: no JVD,  Cardiovascular: S1 and S2 Present, no Murmur,  Respiratory: good respiratory effort, Bilateral Air entry equal and Decreased, no Crackles, no wheezes Abdomen: Bowel Sound present, Soft and no tenderness,  Skin: no rashes Extremities: RLE edema, cast intact.  Neurologic: without any new focal findings Gait not checked due to patient safety concerns  Vitals:   07/04/23 2024 07/05/23 0416 07/05/23 0742 07/05/23 1332  BP: (!) 100/59 106/66 108/68 113/75  Pulse: 90 70 74 91  Resp: 18 18 17 16   Temp: 97.7 F (36.5 C) 97.6 F (36.4 C) 98.1 F (36.7 C) 98.9 F (37.2 C)  TempSrc:    Temporal  SpO2: 99% 96% 98% 99%  Weight:      Height:        Intake/Output Summary (Last 24 hours) at 07/05/2023 1511 Last data filed at 07/05/2023 1359 Gross per 24 hour   Intake 100 ml  Output 150 ml  Net -50 ml   Filed Weights   07/04/23 1512  Weight: 91.6 kg    Data Reviewed: I have personally reviewed and interpreted daily labs, tele strips, imagings as discussed above. I reviewed all nursing notes, pharmacy notes, vitals, pertinent old records I have discussed plan of care as described above with RN and patient/family.  CBC: Recent Labs  Lab 07/04/23 1518 07/05/23 0408  WBC 14.0* 10.7*  HGB 17.1* 14.1  HCT 52.8* 42.4  MCV 95.8 93.4  PLT 358 307   Basic Metabolic Panel: Recent Labs  Lab 07/04/23 1518 07/04/23 1725 07/05/23 0408  NA 136  --  132*  K 4.9  --  4.0  CL 101  --  97*  CO2 17*  --  28  GLUCOSE 95  --  100*  BUN QUANTITY NOT SUFFICIENT, UNABLE TO PERFORM TEST 26* 32*  CREATININE QUANTITY NOT SUFFICIENT, UNABLE TO PERFORM TEST 0.78 1.45*  CALCIUM  9.5  --  8.9    Studies: CT ANKLE RIGHT WO CONTRAST Result Date: 07/05/2023 CLINICAL DATA:  Ankle fracture EXAM: CT OF THE RIGHT ANKLE WITHOUT CONTRAST TECHNIQUE: Multidetector CT imaging of the right ankle was performed according to the standard protocol.  Multiplanar CT image reconstructions were also generated. RADIATION DOSE REDUCTION: This exam was performed according to the departmental dose-optimization program which includes automated exposure control, adjustment of the mA and/or kV according to patient size and/or use of iterative reconstruction technique. COMPARISON:  Radiographs 07/04/2023 FINDINGS: Bones/Joint/Cartilage Unstable stage 4 Weber B fracture of the right ankle observed with transverse medial malleolar component, oblique posterior malleolar component, comminuted oblique distal fibular component with intermediary fragments in the distal fibular fracture. A 1.8 by 0.6 by 0.6 cm bony fragment is present anterior to the distal fibular metaphysis. The posterior malleolar fragment is proximally displaced by 3 mm. The medial malleolar fragment is laterally displaced by 3  mm and demonstrates proximally 3 mm of distraction. No other regional fracture identified. Ligaments Suboptimally assessed by CT. Muscles and Tendons No flexor tendon entrapment.  Regional muscular atrophy in the foot. Soft tissues Subcutaneous edema overlying the malleoli. IMPRESSION: 1. Unstable stage 4 Weber B fracture of the right ankle with transverse medial malleolar component, oblique posterior malleolar component, and comminuted oblique distal fibular component with intermediary fragments as well as a small fragment anterior to the distal fibular metaphysis. 2. Regional muscular atrophy in the foot. 3. Subcutaneous edema overlying the malleoli. Electronically Signed   By: Freida Jes M.D.   On: 07/05/2023 09:00   DG Tibia/Fibula Right Result Date: 07/04/2023 CLINICAL DATA:  pain EXAM: RIGHT TIBIA AND FIBULA - 2 VIEW COMPARISON:  X-ray right foot 07/04/2023, x-ray right knee 07/04/2023 FINDINGS: Acute displaced and comminuted transsyndesmotic distal fibular fracture. Poorly visualized acute posterior malleolar fracture. Cortical irregularity along the medial malleolus may represent an acute fracture. Ankle subcutaneus soft tissue edema. Redemonstration of possible acute on chronic nondisplaced fibular head neck and proximal shaft fracture. IMPRESSION: 1. Acute displaced and comminuted transsyndesmotic distal fibular fracture. 2. Poorly visualized acute posterior malleolar fracture. 3. Cortical irregularity along the medial malleolus may represent an acute fracture. 4. Recommend dedicated three view radiograph of the right ankle. 5. Redemonstration of possible acute on chronic nondisplaced fibular head neck and proximal shaft fracture. Correlate with point tenderness to palpation. Electronically Signed   By: Morgane  Naveau M.D.   On: 07/04/2023 17:29   DG Knee Complete 4 Views Right Result Date: 07/04/2023 CLINICAL DATA:  pain Pt fell a few days ago Entire right lower leg is swollen, wound on foot  and ankle, bruising Pt was unable to move at all and had to be held for all images EXAM: RIGHT KNEE - COMPLETE 4+ VIEW COMPARISON:  None Available. FINDINGS: Partially visualized intramedullary nail fixation of the distal femur. Cortical irregularity of the fibular head/neck and proximal shaft with query underlying nondisplaced acute fracture. No evidence of acute displaced fracture, dislocation, or joint effusion. No evidence of arthropathy or other focal bone abnormality. Soft tissues are unremarkable. IMPRESSION: Question acute on chronic nondisplaced proximal fibular shaft fracture. Correlate with point tenderness to palpation to evaluate for an underlying acute component. Electronically Signed   By: Morgane  Naveau M.D.   On: 07/04/2023 17:26   DG Foot Complete Right Result Date: 07/04/2023 CLINICAL DATA:  Pain EXAM: RIGHT FOOT COMPLETE - 3+ VIEW COMPARISON:  None Available. FINDINGS: There is soft tissue swelling of the foot diffusely. There is an acute fracture of the distal fibula, mildly displaced. No additional fractures of the foot identified. IMPRESSION: 1. Acute fracture of the distal fibula, mildly displaced. Recommend dedicated views of the ankle. 2. Soft tissue swelling of the foot diffusely. Electronically Signed   By:  Tyron Gallon M.D.   On: 07/04/2023 17:20   CT Cervical Spine Wo Contrast Result Date: 07/04/2023 CLINICAL DATA:  Status post fall 2 days ago. EXAM: CT CERVICAL SPINE WITHOUT CONTRAST TECHNIQUE: Multidetector CT imaging of the cervical spine was performed without intravenous contrast. Multiplanar CT image reconstructions were also generated. RADIATION DOSE REDUCTION: This exam was performed according to the departmental dose-optimization program which includes automated exposure control, adjustment of the mA and/or kV according to patient size and/or use of iterative reconstruction technique. COMPARISON:  September 07, 2020 FINDINGS: Alignment: There is straightening of the normal  cervical spine lordosis. Skull base and vertebrae: No acute fracture. No primary bone lesion or focal pathologic process. Soft tissues and spinal canal: No prevertebral fluid or swelling. No visible canal hematoma. Disc levels: Marked severity endplate sclerosis, anterior osteophyte formation and posterior bony spurring are seen at the levels of C4-C5 and C5-C6. Moderate to marked severity intervertebral disc space narrowing is seen at the level of C5-C6, with moderate severity intervertebral disc space narrowing seen throughout the remainder of the cervical spine. Bilateral marked severity multilevel facet joint hypertrophy is noted, left greater than right. Upper chest: Negative. Other: None. IMPRESSION: Marked severity multilevel degenerative changes, as described above, without evidence of an acute fracture or subluxation. Electronically Signed   By: Virgle Grime M.D.   On: 07/04/2023 16:12   CT Head Wo Contrast Result Date: 07/04/2023 CLINICAL DATA:  Status post fall 2 days ago. EXAM: CT HEAD WITHOUT CONTRAST TECHNIQUE: Contiguous axial images were obtained from the base of the skull through the vertex without intravenous contrast. RADIATION DOSE REDUCTION: This exam was performed according to the departmental dose-optimization program which includes automated exposure control, adjustment of the mA and/or kV according to patient size and/or use of iterative reconstruction technique. COMPARISON:  September 07, 2020 FINDINGS: Brain: There is generalized cerebral atrophy with widening of the extra-axial spaces and ventricular dilatation. There are areas of decreased attenuation within the white matter tracts of the supratentorial brain, consistent with microvascular disease changes. Small, stable areas of bilateral frontal lobe encephalomalacia are seen. Vascular: Marked severity bilateral cavernous carotid artery calcification is noted. Skull: Normal. Negative for fracture or focal lesion. Sinuses/Orbits: No  acute finding. Other: None. IMPRESSION: 1. Generalized cerebral atrophy and microvascular disease changes of the supratentorial brain. 2. Small, stable areas of bilateral frontal lobe encephalomalacia. 3. No acute intracranial abnormality. Electronically Signed   By: Virgle Grime M.D.   On: 07/04/2023 16:10   DG Ankle Complete Right Result Date: 07/04/2023 CLINICAL DATA:  injury.  Unwitnessed fall. EXAM: RIGHT ANKLE - COMPLETE 3+ VIEW COMPARISON:  None Available. FINDINGS: There is comminuted mildly displaced fracture of the lower metadiaphysis of the right fibula. There is also undisplaced/minimally displaced fracture of the medial malleolus. No other acute fracture or dislocation. No aggressive osseous lesion. Ankle mortise appears intact. Calcaneal spur noted along the Achilles tendon attachment site. No focal soft tissue swelling. No radiopaque foreign bodies. IMPRESSION: *Bimalleolar fracture, as described above. Electronically Signed   By: Beula Brunswick M.D.   On: 07/04/2023 15:47    Scheduled Meds:  [MAR Hold] amLODipine  5 mg Oral Daily   [MAR Hold] atenolol  50 mg Oral Daily   [MAR Hold] atorvastatin   40 mg Oral QHS   [MAR Hold] carbamazepine   600 mg Oral BID   [MAR Hold] DULoxetine   20 mg Oral Daily   feeding supplement  237 mL Oral TID BM   [MAR Hold] lamoTRIgine   300 mg Oral BID   [MAR Hold] lidocaine   1 patch Transdermal Q24H   [START ON 07/06/2023] multivitamin with minerals  1 tablet Oral Daily   [MAR Hold] pantoprazole   40 mg Oral BID   [MAR Hold] tamsulosin  0.4 mg Oral QPC supper   Continuous Infusions:  sodium chloride  Stopped (07/05/23 1300)   PRN Meds: 0.9 % irrigation (POUR BTL), [MAR Hold] acetaminophen , bupivacaine  (PF) (MARCAINE ) 15 mL, bupivacaine  liposome (EXPAREL ) 1.3 % 15 mL, [MAR Hold] hydrALAZINE  **OR** [MAR Hold] hydrALAZINE , [MAR Hold] methocarbamol, [MAR Hold]  morphine injection, [MAR Hold] ondansetron  (ZOFRAN ) IV, [MAR Hold] oxyCODONE -acetaminophen , [MAR  Hold] senna-docusate, [MAR Hold] traZODone   Time spent: 55 minutes  Author: Althia Atlas. MD [facility] 07/05/2023 3:11 PM  To reach On-call, see care teams to locate the attending and reach out to them via [email or web address]. If 7PM-7AM, please contact night-coverage If you still have difficulty reaching the attending provider, please page the [facility] (Director on Call) for [facility] on amion for assistance.

## 2023-07-05 NOTE — Progress Notes (Signed)
 Initial Nutrition Assessment  DOCUMENTATION CODES:   Obesity unspecified  INTERVENTION:   Ensure Enlive po TID, each supplement provides 350 kcal and 20 grams of protein.  MVI po daily   NUTRITION DIAGNOSIS:   Increased nutrient needs related to post-op healing as evidenced by estimated needs.  GOAL:   Patient will meet greater than or equal to 90% of their needs  MONITOR:   PO intake, Supplement acceptance, Labs, Weight trends, I & O's, Skin  REASON FOR ASSESSMENT:   Consult Assessment of nutrition requirement/status  ASSESSMENT:   58 y.o. male with medical history significant of TBI, s/p vagus nerve stimulator placement, hypertension, hyperlipidemia, stroke, depression and seizure who presents with closed right ankle fracture after fall.  RD unable to see pt today as pt in the OR at the time of RD visit. Pt with increased estimated needs r/t post op healing. RD will add supplements and MVI to help pt meet his estimated needs. Pt NPO today for open ORIF. Per chart, pt appears weight stable at baseline. RD will obtain exam and history at follow up.   Medications reviewed and include: protoinix, NaCl @100ml /hr  Labs reviewed: Na 132(L), K 4.0 wnl, BUN 32(H), creat 1.45(H) Wbc- 10.7(H)  NUTRITION - FOCUSED PHYSICAL EXAM: Unable to perform at this time   Diet Order:   Diet Order             Diet NPO time specified Except for: Sips with Meds  Diet effective ____                  EDUCATION NEEDS:   Not appropriate for education at this time  Skin:  Skin Assessment: Reviewed RN Assessment  Last BM:  5/5  Height:   Ht Readings from Last 1 Encounters:  07/04/23 5\' 8"  (1.727 m)    Weight:   Wt Readings from Last 1 Encounters:  07/04/23 91.6 kg    Ideal Body Weight:  70 kg  BMI:  Body mass index is 30.71 kg/m.  Estimated Nutritional Needs:   Kcal:  2000-2300kcal/day  Protein:  100-115g/day  Fluid:  2.1-2.4L/day  Torrance Freestone MS, RD,  LDN If unable to be reached, please send secure chat to "RD inpatient" available from 8:00a-4:00p daily

## 2023-07-05 NOTE — Op Note (Signed)
 Operative note   Surgeon:Kaisley Stiverson Armed forces logistics/support/administrative officer: None    Preop diagnosis: 1.  Right trimalleolar ankle fracture 2.  Syndesmotic disruption right ankle    Postop diagnosis: Same    Procedure: 1.  Open reduction with internal fixation trimalleolar ankle fracture right ankle 2.  Syndesmotic stabilization and repair right ankle    EBL: Minimal    Anesthesia:local and general.  Local consisted of a total of 30 cc of a one-to-one mixture of 0.25% bupivacaine  and Exparel  long-acting anesthetic    Hemostasis: Thigh tourniquet inflated to 250 mmHg for approximately 90 minutes    Specimen: None    Complications: None    Operative indications:Jared Grimes is an 58 y.o. that presents today for surgical intervention.  The risks/benefits/alternatives/complications have been discussed and consent has been given.    Procedure:  Patient was brought into the OR and placed on the operating table in thesupine position. After anesthesia was obtained theright lower extremity was prepped and draped in usual sterile fashion.  Attention was directed initially to the lateral aspect of the ankle where after inflation of the tourniquet a lateral fibular incision was performed.  Sharp and blunt dissection carried down to the periosteum.  Subperiosteal dissection was performed.  Care was taken to retract all vital neurovascular structures and Bovie cauterize bleeders as appropriate.  There was noted to be a short oblique fracture proximal to the ankle.  There was a small amount of comminution both posterior and medial.  The fracture was reduced with bone reduction clamps.  A 11 hole anatomic fibular plate from [employer] was then applied laterally.  2.7 millimeter screws were applied both proximal and distal to the fracture site.  Good stability and alignment was noted.    Attention was then directed to the medial aspect of the ankle where a medial malleolar incision was performed.  Sharp and blunt dissection was  carried down to the periosteum.  At this time the distal medial malleolar avulsion type of fracture was noted.  All of the periosteum was removed from the fracture site and the wound was then flushed with copious amounts of irrigation.  Next this was stabilized and a small medial hook plate from the [employer] screw set was placed along the distal medial malleolus.  This was impacted into the fracture fragment.  This was held under compression manually and stabilized with a K wire.  Next a compression 3.5 millimeter screw was placed in the plate with good compression noted at the fracture site.  A second 3.5 mm locking screw was placed just superior to this.  Anatomic alignment was noted.  With stress there was a little bit of fibular displacement in the syndesmosis.  Attention was redirected laterally.  At about 1 cm proximal to the ankle joint the [employer] syndesmotic repair suture button was placed using standard technique into the tibia.  This was stabilized against the fibula and good stability was noted at this time.  This was then cut flush against the bone.  Further evaluation with fluoroscopy was performed with good anatomically aligned position.  The posterior malleolus at this time was evaluated and was noted to be anatomic.  On CT scan only a small amount of the joint was noted in the fracture site and it was felt appropriate to defer stabilization.  All wounds were flushed 1 last time.  Closure was then performed with a 2-0 Vicryl for the deeper subcutaneous tissues.  Further subcutaneous tissue closure with a 3-0  Vicryl and the skin was reapproximated with a skin stapler.  The patient was placed in a bulky sterile dressing.  A posterior splint was applied with the foot in a neutral position.    Patient tolerated the procedure and anesthesia well.  Was transported from the OR to the PACU with all vital signs stable and vascular status intact. To be discharged per routine protocol to the floor.  Physical  therapy has been ordered.  Will follow-up tomorrow for pain management as needed.

## 2023-07-05 NOTE — Plan of Care (Signed)
  Problem: Education: Goal: Knowledge of General Education information will improve Description: Including pain rating scale, medication(s)/side effects and non-pharmacologic comfort measures Outcome: Progressing   Problem: Clinical Measurements: Goal: Diagnostic test results will improve Outcome: Progressing   Problem: Coping: Goal: Level of anxiety will decrease Outcome: Progressing   Problem: Pain Managment: Goal: General experience of comfort will improve and/or be controlled Outcome: Progressing   Problem: Safety: Goal: Ability to remain free from injury will improve Outcome: Progressing

## 2023-07-05 NOTE — Progress Notes (Signed)
   07/05/23 1400  Spiritual Encounters  Type of Visit Initial  Care provided to: Pt not available  Conversation partners present during encounter Nurse  Referral source Nurse (RN/NT/LPN)  Reason for visit Advance directives  OnCall Visit No   Spiritual consult for advance directives, patient unavailable, already sent to surgery

## 2023-07-06 ENCOUNTER — Encounter: Payer: Self-pay | Admitting: Podiatry

## 2023-07-06 DIAGNOSIS — S82891A Other fracture of right lower leg, initial encounter for closed fracture: Secondary | ICD-10-CM | POA: Diagnosis not present

## 2023-07-06 LAB — CBC
HCT: 36.3 % — ABNORMAL LOW (ref 39.0–52.0)
Hemoglobin: 12 g/dL — ABNORMAL LOW (ref 13.0–17.0)
MCH: 30.9 pg (ref 26.0–34.0)
MCHC: 33.1 g/dL (ref 30.0–36.0)
MCV: 93.6 fL (ref 80.0–100.0)
Platelets: 252 10*3/uL (ref 150–400)
RBC: 3.88 MIL/uL — ABNORMAL LOW (ref 4.22–5.81)
RDW: 13.2 % (ref 11.5–15.5)
WBC: 10.2 10*3/uL (ref 4.0–10.5)
nRBC: 0 % (ref 0.0–0.2)

## 2023-07-06 LAB — BASIC METABOLIC PANEL WITH GFR
Anion gap: 8 (ref 5–15)
BUN: 29 mg/dL — ABNORMAL HIGH (ref 6–20)
CO2: 25 mmol/L (ref 22–32)
Calcium: 8.1 mg/dL — ABNORMAL LOW (ref 8.9–10.3)
Chloride: 101 mmol/L (ref 98–111)
Creatinine, Ser: 1.05 mg/dL (ref 0.61–1.24)
GFR, Estimated: >60 mL/min (ref >60)
Glucose, Bld: 98 mg/dL (ref 70–99)
Potassium: 4.3 mmol/L (ref 3.5–5.1)
Sodium: 134 mmol/L — ABNORMAL LOW (ref 135–145)

## 2023-07-06 LAB — PHOSPHORUS: Phosphorus: 3.7 mg/dL (ref 2.5–4.6)

## 2023-07-06 LAB — MAGNESIUM: Magnesium: 2.3 mg/dL (ref 1.7–2.4)

## 2023-07-06 MED ORDER — ENOXAPARIN SODIUM 40 MG/0.4ML IJ SOSY
40.0000 mg | PREFILLED_SYRINGE | Freq: Every evening | INTRAMUSCULAR | Status: DC
  Administered 2023-07-06 – 2023-07-17 (×12): 40 mg via SUBCUTANEOUS
  Filled 2023-07-06 (×12): qty 0.4
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
## 2023-07-06 NOTE — Progress Notes (Signed)
 Daily Progress Note   Subjective  - 1 Day Post-Op  Status post 1 day trimalleolar ankle fracture open reduction with internal fixation.  Patient somewhat more confused today versus baseline yesterday.  Did not express pain.  Was able to answer that question appropriately.  Objective Vitals:   07/06/23 0015 07/06/23 0401 07/06/23 0532 07/06/23 0805  BP: 115/65 115/75  119/75  Pulse: 100 93  86  Resp: 15 16  16   Temp: 97.6 F (36.4 C) (!) 97.5 F (36.4 C)  (!) 97.5 F (36.4 C)  TempSrc: Oral Oral  Oral  SpO2: 98% 97%  98%  Weight:   94.8 kg   Height:        Physical Exam: Splint is intact.  Good cap fill time to digits.  Laboratory CBC    Component Value Date/Time   WBC 10.2 07/06/2023 0539   HGB 12.0 (L) 07/06/2023 0539   HGB 11.0 (L) 10/20/2020 1435   HCT 36.3 (L) 07/06/2023 0539   HCT 34.5 (L) 10/20/2020 1435   PLT 252 07/06/2023 0539   PLT 377 10/20/2020 1435    BMET    Component Value Date/Time   NA 134 (L) 07/06/2023 0539   K 4.3 07/06/2023 0539   CL 101 07/06/2023 0539   CO2 25 07/06/2023 0539   GLUCOSE 98 07/06/2023 0539   BUN 29 (H) 07/06/2023 0539   CREATININE 1.05 07/06/2023 0539   CALCIUM  8.1 (L) 07/06/2023 0539   GFRNONAA >60 07/06/2023 0539   GFRAA >60 03/03/2019 0545    Assessment/Planning: Trimalleolar ankle fracture status post ORIF  Patient currently denies severe pain.  He did have some confusion today.  Discussed this with the son initially who states when he does not take his seizure medication regularly or routinely or if there is changes in his medication he does become confused.  This could also be postanesthesia confusion as well.  Was not complaining of shortness of breath or chest pain. States pain to the ankle was minimal at this point.  I would recommend avoiding narcotics if possible and treat with other pain modalities. Strict nonweightbearing to the right foot.  Physical therapy has been ordered but given his confusion I do not  suspect he will be available for therapy today. Will continue to follow while he is in house.  Anell Baptist A  07/06/2023, 1:45 PM
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
## 2023-07-06 NOTE — TOC Progression Note (Signed)
 Transition of Care [facility]) - Progression Note    Patient Details  Name: KEYANDRE MOYO MRN: [id number] Date of Birth: 04-Jul-1965  [facility] of [facility]) CM/SW Contact  Crayton Docker, RN 07/06/2023, 3:10 PM  Clinical Narrative:     The above named patient is recommended to go to Short Term Rehab for strengthening and gait training for balance.  It is expected that the Short Term Rehab stay will be less than 30 days.  The patient is expected to return home after Rehab.   Expected Discharge Plan and Services     SNF    Social Determinants of Health (SDOH) Interventions SDOH Screenings   Food Insecurity: No Food Insecurity (07/04/2023)  Housing: Low Risk  (07/04/2023)  Transportation Needs: No Transportation Needs (07/04/2023)  Utilities: Not At Risk (07/04/2023)  Financial Resource Strain: Low Risk  (01/02/2023)   Received from [facility]  Social Connections: Patient Declined (07/04/2023)  Tobacco Use: Low Risk  (07/04/2023)    Readmission Risk Interventions     No data to display

## 2023-07-06 NOTE — [place] FL2 (Signed)
 [place]  MEDICAID [id number] LEVEL OF CARE FORM     IDENTIFICATION  Patient Name: Jared Grimes Birthdate: Jul 12, 1965 Sex: male Admission Date (Current Location): 07/04/2023  [place] and [place] Number:  Jared Grimes [id number] [facility] Facility and Address:  [facility], [address] [place], [place] [address]      Provider Number: [id number]  Attending Physician Name and Address:  Althia Atlas, MD  Relative Name and Phone Number:  Narvis Bad, sister, phone: [phone]    Current Level of Care: Hospital Recommended Level of Care: Skilled Nursing Facility Prior Approval Number:    Date Approved/Denied:   PASRR Number:    Discharge Plan: SNF    Current Diagnoses: Patient Active Problem List   Diagnosis Date Noted   Closed right ankle fracture 07/04/2023   Depression 07/04/2023   Obesity (BMI 30-39.9) 07/04/2023   Fall at home, initial encounter 07/04/2023   History of colonic polyps 01/19/2022   Adenomatous polyp of colon 01/19/2022   Syncope and collapse 09/07/2020   Frequent falls 09/07/2020   Acute blood loss anemia 09/07/2020   Melena 09/07/2020   ABLA (acute blood loss anemia) 09/07/2020   History of TIA (transient ischemic attack) 09/07/2020   Weakness 02/18/2016   Status post placement of VNS (vagus nerve stimulation) device 02/16/2016   TIA (transient ischemic attack) 02/08/2016   Chronic hyponatremia 02/08/2016   Hyperglycemia 02/08/2016   Left leg weakness 02/07/2016   HTN, goal below 140/80 02/07/2016   Ambulatory dysfunction 02/07/2016   Seizure disorder (HCC) 02/07/2016    Orientation RESPIRATION BLADDER Height & Weight     Self, Time, Situation, Place  Normal Incontinent Weight: 94.8 kg Height:  5\' 8"  (172.7 cm)  BEHAVIORAL SYMPTOMS/MOOD NEUROLOGICAL BOWEL NUTRITION STATUS    Convulsions/Seizures Incontinent Diet (Please see discharge summary)  AMBULATORY STATUS COMMUNICATION OF NEEDS Skin   Extensive Assist Verbally  Bruising (redness, dryness right arm/foot)                       Personal Care Assistance Level of Assistance  Bathing, Feeding, Dressing, Total care Bathing Assistance: Limited assistance Feeding assistance: Limited assistance Dressing Assistance: Limited assistance Total Care Assistance: Limited assistance   Functional Limitations Info  Sight Sight Info: Impaired        SPECIAL CARE FACTORS FREQUENCY  PT (By licensed PT), OT (By licensed OT)     PT Frequency: 5x per week OT Frequency: 5x per week            Contractures      Additional Factors Info  Code Status, Allergies Code Status Info: Full Code Allergies Info: No Known Allergies           Current Medications (07/06/2023):  This is the current hospital active medication list Current Facility-Administered Medications  Medication Dose Route Frequency Provider Last Rate Last Admin   acetaminophen  (TYLENOL ) tablet 650 mg  650 mg Oral Q6H PRN Anell Baptist, DPM   650 mg at 07/04/23 2052   amLODipine (NORVASC) tablet 5 mg  5 mg Oral Daily Fowler, Justin, DPM   5 mg at 07/06/23 0955   atenolol (TENORMIN) tablet 50 mg  50 mg Oral Daily Fowler, Justin, DPM   50 mg at 07/06/23 6387   atorvastatin  (LIPITOR) tablet 40 mg  40 mg Oral QHS Fowler, Justin, DPM   40 mg at 07/05/23 2115   carbamazepine  (TEGRETOL  XR) 12 hr tablet 600 mg  600 mg Oral BID Fowler, Justin, DPM   600  mg at 07/06/23 0956   DULoxetine  (CYMBALTA ) DR capsule 20 mg  20 mg Oral Daily Anell Baptist, DPM   20 mg at 07/06/23 1610   enoxaparin  (LOVENOX ) injection 40 mg  40 mg Subcutaneous QPM Althia Atlas, MD       feeding supplement (ENSURE ENLIVE / ENSURE PLUS) liquid 237 mL  237 mL Oral TID BM Fowler, Justin, DPM   237 mL at 07/06/23 1008   hydrALAZINE  (APRESOLINE ) injection 10 mg  10 mg Intravenous Q2H PRN Anell Baptist, DPM       Or   hydrALAZINE  (APRESOLINE ) tablet 50 mg  50 mg Oral Q6H PRN Anell Baptist, DPM       lamoTRIgine  (LAMICTAL ) tablet  300 mg  300 mg Oral BID Anell Baptist, DPM   300 mg at 07/06/23 0955   lidocaine  (LIDODERM ) 5 % 1 patch  1 patch Transdermal Q24H Anell Baptist, DPM   1 patch at 07/04/23 1927   methocarbamol (ROBAXIN) tablet 500 mg  500 mg Oral Q8H PRN Anell Baptist, DPM   500 mg at 07/06/23 0537   morphine (PF) 2 MG/ML injection 2 mg  2 mg Intravenous Q4H PRN Fowler, Justin, DPM       multivitamin with minerals tablet 1 tablet  1 tablet Oral Daily Fowler, Justin, DPM   1 tablet at 07/06/23 9604   ondansetron  (ZOFRAN ) injection 4 mg  4 mg Intravenous Q8H PRN Anell Baptist, DPM       oxyCODONE -acetaminophen  (PERCOCET/ROXICET) 5-325 MG per tablet 1 tablet  1 tablet Oral Q4H PRN Fowler, Justin, DPM   1 tablet at 07/06/23 0537   pantoprazole  (PROTONIX ) EC tablet 40 mg  40 mg Oral BID Anell Baptist, DPM   40 mg at 07/06/23 5409   senna-docusate (Senokot-S) tablet 1 tablet  1 tablet Oral QHS PRN Anell Baptist, DPM       tamsulosin (FLOMAX) capsule 0.4 mg  0.4 mg Oral QPC supper Anell Baptist, DPM   0.4 mg at 07/05/23 1743   traZODone  (DESYREL ) tablet 200 mg  200 mg Oral QHS PRN Fowler, Justin, DPM   200 mg at 07/05/23 0021     Discharge Medications: Please see discharge summary for a list of discharge medications.  Relevant Imaging Results:  Relevant Lab Results:   Additional Information SSN: [id number]  Crayton Docker, RN
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
## 2023-07-06 NOTE — TOC Progression Note (Signed)
 Transition of Care [facility]) - Progression Note    Patient Details  Name: Jared Grimes MRN: [id number] Date of Birth: Oct 09, 1965  Transition of Care [facility]) CM/SW Contact  Crayton Docker, RN Phone Number: 07/06/2023, 4:32 PM  Clinical Narrative:     CM call placed to patient's sister, Benn Brash, phone: [phone] per request regarding patient care and SNF recommendations for short term rehabilitation. Patient sister verbalized understanding and SNF recommendations. Per patient's sister requests CM to call patient's son, [place] regarding patient status and SNF recommendations. CM call to patient's son, phone: [phone] regarding patient admissions and SNF recommendations. Patient's son verbalized understanding and agreement with plan of care.   Noted, SNF declined per [facility], due to cannot meet patient's needs.  Expected Discharge Plan and Services   SNF    Social Determinants of Health (SDOH) Interventions SDOH Screenings   Food Insecurity: No Food Insecurity (07/04/2023)  Housing: Low Risk  (07/04/2023)  Transportation Needs: No Transportation Needs (07/04/2023)  Utilities: Not At Risk (07/04/2023)  Financial Resource Strain: Low Risk  (01/02/2023)   Received from [facility]  Social Connections: Patient Declined (07/04/2023)  Tobacco Use: Low Risk  (07/04/2023)    Readmission Risk Interventions     No data to display

## 2023-07-07 ENCOUNTER — Inpatient Hospital Stay

## 2023-07-07 DIAGNOSIS — W19XXXA Unspecified fall, initial encounter: Secondary | ICD-10-CM | POA: Diagnosis not present

## 2023-07-07 DIAGNOSIS — S82891A Other fracture of right lower leg, initial encounter for closed fracture: Secondary | ICD-10-CM | POA: Diagnosis not present

## 2023-07-07 DIAGNOSIS — E669 Obesity, unspecified: Secondary | ICD-10-CM | POA: Diagnosis not present

## 2023-07-07 DIAGNOSIS — G40909 Epilepsy, unspecified, not intractable, without status epilepticus: Secondary | ICD-10-CM | POA: Diagnosis not present

## 2023-07-07 LAB — BASIC METABOLIC PANEL WITH GFR
Anion gap: 10 (ref 5–15)
BUN: 22 mg/dL — ABNORMAL HIGH (ref 6–20)
CO2: 27 mmol/L (ref 22–32)
Calcium: 8 mg/dL — ABNORMAL LOW (ref 8.9–10.3)
Chloride: 97 mmol/L — ABNORMAL LOW (ref 98–111)
Creatinine, Ser: 0.68 mg/dL (ref 0.61–1.24)
GFR, Estimated: >60 mL/min (ref >60)
Glucose, Bld: 98 mg/dL (ref 70–99)
Potassium: 4 mmol/L (ref 3.5–5.1)
Sodium: 134 mmol/L — ABNORMAL LOW (ref 135–145)

## 2023-07-07 LAB — CBC
HCT: 33.9 % — ABNORMAL LOW (ref 39.0–52.0)
Hemoglobin: 11.2 g/dL — ABNORMAL LOW (ref 13.0–17.0)
MCH: 31.1 pg (ref 26.0–34.0)
MCHC: 33 g/dL (ref 30.0–36.0)
MCV: 94.2 fL (ref 80.0–100.0)
Platelets: 256 10*3/uL (ref 150–400)
RBC: 3.6 MIL/uL — ABNORMAL LOW (ref 4.22–5.81)
RDW: 13.1 % (ref 11.5–15.5)
WBC: 9.4 10*3/uL (ref 4.0–10.5)
nRBC: 0 % (ref 0.0–0.2)

## 2023-07-07 LAB — PHOSPHORUS: Phosphorus: 2.7 mg/dL (ref 2.5–4.6)

## 2023-07-07 LAB — MAGNESIUM: Magnesium: 2.3 mg/dL (ref 1.7–2.4)
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
## 2023-07-07 NOTE — Plan of Care (Signed)
  Problem: Pain Managment: Goal: General experience of comfort will improve and/or be controlled Outcome: Progressing   Problem: Skin Integrity: Goal: Risk for impaired skin integrity will decrease Outcome: Progressing   Problem: Elimination: Goal: Will not experience complications related to urinary retention Outcome: Not Progressing

## 2023-07-07 NOTE — Progress Notes (Signed)
 Progress Note   Patient: Jared Grimes ZOX:[id number] DOB: [birth year] DOA: 07/04/2023     3 DOS: the patient was seen and examined on 07/07/2023   Brief hospital course: Jared Grimes is a 58 y.o. male with medical history significant of TBI, s/p vagus nerve stimulator placement, hypertension, hyperlipidemia, stroke, depression, seizure, who presents with fall, pain in right foot and ankle. He was found to have right ankle fracture admitted to [facility] service with orthopedic surgery evaluation. Patient had ORIF procedure currently awaiting SNF placement.  Assessment and Plan: S/p fall unwitnessed Closed right ankle fracture:  s/p Open reduction with internal fixation trimalleolar ankle fracture right ankle, Syndesmotic stabilization and repair right ankle  Continue pain control: prn morphine , percocet and tyleno Continue Zofran  for nausea, Robaxin  for muscle spasm Lidoderm  patch for pain PT/ OT advised SNF placement. TOC working on placement.   AKI Resolved. S/p IV fluid for hydration Foley catheter since yesterday for urinary retention ordered. Continue Flomax   Confusion, dysarthria History of stroke:  Continue Lipitor. Given more confused, dysarthria which is new per family Iam going to order MRI brain rule out stroke, intracranial bleed.   Leukocytosis: WBC 14.0 Likely due to stress. WBC improved.   Seizure disorder (HCC) Continue seizure precaution As needed Ativan for seizure Continue home Tegretol  600 mg twice daily and Lamictal  300 mg twice daily.   Hypertension:  Amlodipine , atenolol , Cozaar  discontinued due to low blood pressure Will give IV hydralazine  as needed. Monitor BP and titrate medications accordingly   Depression: Continue home medications   Obesity class I BMI 31.78 kg/m.  Encourage to lose some weight Exercise and healthy diet      Out of bed to chair. Incentive spirometry. Nursing supportive care. Fall, aspiration precautions. Diet:  Diet Orders  (From admission, onward)     Start     Ordered   07/06/23 1033  Diet regular Room service appropriate? Yes; Fluid consistency: Thin  Diet effective now       Question Answer Comment  Room service appropriate? Yes   Fluid consistency: Thin      07/06/23 1032           DVT prophylaxis: enoxaparin  (LOVENOX ) injection 40 mg Start: 07/06/23 1800 SCDs Start: 07/04/23 1916  Level of care: Med-Surg   Code Status: Full Code  Subjective: Patient is seen and examined today morning. He is lying in bed. Poor historian due to TBI. RN notified that he is retaining urine since last night. Foley ordered. Son at bedside, says he is not acting normal. Has slurred speech on and off confusion.  Physical Exam: Vitals:   07/06/23 2023 07/07/23 0009 07/07/23 0337 07/07/23 0753  BP: (!) 151/88  100/63 136/83  Pulse: 86 78 63 82  Resp: 18  16 18   Temp: 97.7 F (36.5 C)  97.6 F (36.4 C) 97.8 F (36.6 C)  TempSrc:    Oral  SpO2: 93% 97% 96% 95%  Weight:   94.8 kg   Height:        General - Middle aged Caucasian male, no apparent distress HEENT - PERRLA, EOMI, atraumatic head, non tender sinuses. Lung - Clear, basal rales, no rhonchi, wheezes. Heart - S1, S2 heard, no murmurs, rubs, trace pedal edema. Abdomen - Soft, non tender, obese, bowel sounds good Neuro - Alert, awake and oriented, slow mentation, mild dysarthria Skin - Warm and dry. Right leg cast, dressing  Data Reviewed:      Latest Ref Rng & Units 07/07/2023  4:17 AM 07/06/2023    5:39 AM 07/05/2023    4:08 AM  CBC  WBC 4.0 - 10.5 K/uL 9.4  10.2  10.7   Hemoglobin 13.0 - 17.0 g/dL 16.1  09.6  04.5   Hematocrit 39.0 - 52.0 % 33.9  36.3  42.4   Platelets 150 - 400 K/uL 256  252  307       Latest Ref Rng & Units 07/07/2023    4:17 AM 07/06/2023    5:39 AM 07/05/2023    4:08 AM  BMP  Glucose 70 - 99 mg/dL 98  98  409   BUN 6 - 20 mg/dL 22  29  32   Creatinine 0.61 - 1.24 mg/dL 8.11  9.14  7.82   Sodium 135 - 145 mmol/L 134  134   132   Potassium 3.5 - 5.1 mmol/L 4.0  4.3  4.0   Chloride 98 - 111 mmol/L 97  101  97   CO2 22 - 32 mmol/L 27  25  28    Calcium  8.9 - 10.3 mg/dL 8.0  8.1  8.9    DG Ankle 2 Views Right Result Date: 07/05/2023 CLINICAL DATA:  Elective surgery. EXAM: RIGHT ANKLE - 2 VIEW COMPARISON:  Preoperative imaging FINDINGS: Six fluoroscopic spot views of the ankle submitted from the operating room. Lateral plate and screw fixation of distal fibular fracture. Plate and screw fixation of the medial malleolus. There is a syndesmotic trapeze. Skin staples are seen medially. Fluoroscopy time 50 seconds. Dose 1.38 mGy. IMPRESSION: Intraoperative fluoroscopy during ankle fracture fixation. Electronically Signed   By: Jared Grimes M.D.   On: 07/05/2023 15:56   DG C-Arm 1-60 Min-No Report Result Date: 07/05/2023 Fluoroscopy was utilized by the requesting physician.  No radiographic interpretation.   DG C-Arm 1-60 Min-No Report Result Date: 07/05/2023 Fluoroscopy was utilized by the requesting physician.  No radiographic interpretation.    Disposition: Status is: Inpatient Remains inpatient appropriate because: SNF placement, MRI brain, urinary retention  Planned Discharge Destination: Skilled nursing facility     Time spent: 40 minutes  Author: Aisha Hove, MD 07/07/2023 11:21 AM Secure chat 7am to 7pm For on call review [email or web address].

## 2023-07-07 NOTE — Progress Notes (Signed)
 Bladder scan completed at 0005. Volume noted to be over <440mL.  Writer notified the on-call provider.  New order for in and out catheter.  In and out catheter completed at 0035. In and Out volume was noted to be .  Patient tolerated well.

## 2023-07-07 NOTE — Progress Notes (Signed)
 Daily Progress Note   Subjective  - 2 Days Post-Op  Right ankle ORIF.  Patient still seems somewhat confused.  He is asking where his son is.  I did speak to his son on the phone yesterday who is out of state.  He did not complain of pain into his right lower extremity.  Objective Vitals:   07/06/23 2023 07/07/23 0009 07/07/23 0337 07/07/23 0753  BP: (!) 151/88  100/63 136/83  Pulse: 86 78 63 82  Resp: 18  16 18   Temp: 97.7 F (36.5 C)  97.6 F (36.4 C) 97.8 F (36.6 C)  TempSrc:    Oral  SpO2: 93% 97% 96% 95%  Weight:   94.8 kg   Height:        Physical Exam: Splint is intact.  Good cap fill time to the toes.  Laboratory CBC    Component Value Date/Time   WBC 9.4 07/07/2023 0417   HGB 11.2 (L) 07/07/2023 0417   HGB 11.0 (L) 10/20/2020 1435   HCT 33.9 (L) 07/07/2023 0417   HCT 34.5 (L) 10/20/2020 1435   PLT 256 07/07/2023 0417   PLT 377 10/20/2020 1435    BMET    Component Value Date/Time   NA 134 (L) 07/07/2023 0417   K 4.0 07/07/2023 0417   CL 97 (L) 07/07/2023 0417   CO2 27 07/07/2023 0417   GLUCOSE 98 07/07/2023 0417   BUN 22 (H) 07/07/2023 0417   CREATININE 0.68 07/07/2023 0417   CALCIUM  8.0 (L) 07/07/2023 0417   GFRNONAA >60 07/07/2023 0417   GFRAA >60 03/03/2019 0545    Assessment/Planning: Status post right ankle ORIF  He is not complain of any pain.  Physical therapy is working with him.  They have recommended skilled facility for discharge. He still seems somewhat more confused at this time as opposed to baseline preoperatively.  Likely postanesthesia and medication.  Medicine has been closely monitoring. Nonweightbearing is much as possible in this right lower extremity.  Appreciate physical therapy assistance. Upon discharge patient should follow-up with me in approximately 1 week for splint removal and dressing change.  No dressing changes needed in the interim.  Anell Baptist A  07/07/2023, 12:24 PM \

## 2023-07-07 NOTE — Care Management Important Message (Signed)
 Important Message  Patient Details  Name: Jared Grimes MRN: [id number] Date of Birth: January 30, 1966   Important Message Given:  Yes - Medicare IM     Ivie Maese W, CMA 07/07/2023, 11:51 AM

## 2023-07-08 DIAGNOSIS — G40909 Epilepsy, unspecified, not intractable, without status epilepticus: Secondary | ICD-10-CM | POA: Diagnosis not present

## 2023-07-08 DIAGNOSIS — W19XXXA Unspecified fall, initial encounter: Secondary | ICD-10-CM | POA: Diagnosis not present

## 2023-07-08 DIAGNOSIS — R410 Disorientation, unspecified: Secondary | ICD-10-CM

## 2023-07-08 DIAGNOSIS — E669 Obesity, unspecified: Secondary | ICD-10-CM | POA: Diagnosis not present

## 2023-07-08 DIAGNOSIS — S82891A Other fracture of right lower leg, initial encounter for closed fracture: Secondary | ICD-10-CM | POA: Diagnosis not present

## 2023-07-08 DIAGNOSIS — R4781 Slurred speech: Secondary | ICD-10-CM

## 2023-07-08 LAB — BASIC METABOLIC PANEL WITH GFR
Anion gap: 8 (ref 5–15)
BUN: 21 mg/dL — ABNORMAL HIGH (ref 6–20)
CO2: 28 mmol/L (ref 22–32)
Calcium: 8.4 mg/dL — ABNORMAL LOW (ref 8.9–10.3)
Chloride: 97 mmol/L — ABNORMAL LOW (ref 98–111)
Creatinine, Ser: 0.73 mg/dL (ref 0.61–1.24)
GFR, Estimated: >60 mL/min (ref >60)
Glucose, Bld: 98 mg/dL (ref 70–99)
Potassium: 4.3 mmol/L (ref 3.5–5.1)
Sodium: 133 mmol/L — ABNORMAL LOW (ref 135–145)

## 2023-07-08 LAB — CBC
HCT: 36.3 % — ABNORMAL LOW (ref 39.0–52.0)
Hemoglobin: 12.1 g/dL — ABNORMAL LOW (ref 13.0–17.0)
MCH: 31.3 pg (ref 26.0–34.0)
MCHC: 33.3 g/dL (ref 30.0–36.0)
MCV: 93.8 fL (ref 80.0–100.0)
Platelets: 260 10*3/uL (ref 150–400)
RBC: 3.87 MIL/uL — ABNORMAL LOW (ref 4.22–5.81)
RDW: 12.8 % (ref 11.5–15.5)
WBC: 9.3 10*3/uL (ref 4.0–10.5)
nRBC: 0 % (ref 0.0–0.2)

## 2023-07-08 LAB — PHOSPHORUS: Phosphorus: 2.9 mg/dL (ref 2.5–4.6)

## 2023-07-08 LAB — MAGNESIUM: Magnesium: 2.2 mg/dL (ref 1.7–2.4)

## 2023-07-08 MED ORDER — CHLORHEXIDINE GLUCONATE CLOTH 2 % EX PADS
6.0000 | MEDICATED_PAD | Freq: Every day | CUTANEOUS | Status: DC
  Administered 2023-07-08 – 2023-07-18 (×11): 6 via TOPICAL

## 2023-07-08 MED ORDER — CARBAMAZEPINE ER 200 MG PO TB12
600.0000 mg | ORAL_TABLET | Freq: Two times a day (BID) | ORAL | Status: DC
  Administered 2023-07-08 – 2023-07-18 (×20): 600 mg via ORAL
  Filled 2023-07-08 (×20): qty 3

## 2023-07-08 NOTE — Progress Notes (Signed)
 Patient became more confused, pulling at his foley and removing clothes. Explained to patient the need for foley, patient is not comprehending. Placed mitts on hands, increase rounding and administered trazadone. Patient take trazadone at home.

## 2023-07-08 NOTE — TOC Progression Note (Signed)
 Transition of Care [facility]) - Progression Note    Patient Details  Name: ZAYDIN AUGHENBAUGH MRN: [id number] Date of Birth: [birth year]  Transition of [facility]) CM/SW Contact  Jakaiden Fill E Mollee Neer, LCSW Phone Number: 07/08/2023, 3:00 PM  Clinical Narrative:    Call to patient's sister (Tammy). Presented bed offer at [facility] in [place]. She states "this would be their last choice". Sent Medicare Compare list for her to review. She requests CSW call patient's son Merwin to see if he has preferences. She states [place] should make the decisions, but Benn Brash is there to help as he needs it.   CSW called Arnold Line. [place] states he will look at the other options, he states if [facility] is still the only bed offer when patient is medically ready, he would be ok with [facility].   TOC will continue to follow.       Expected Discharge Plan and Services                                               Social Determinants of Health (SDOH) Interventions SDOH Screenings   Food Insecurity: No Food Insecurity (07/04/2023)  Housing: Low Risk  (07/04/2023)  Transportation Needs: No Transportation Needs (07/04/2023)  Utilities: Not At Risk (07/04/2023)  Financial Resource Strain: Low Risk  (01/02/2023)   Received from [facility]  Social Connections: Patient Declined (07/04/2023)  Tobacco Use: Low Risk  (07/04/2023)    Readmission Risk Interventions     No data to display

## 2023-07-08 NOTE — Plan of Care (Signed)
 Neurology plan of care  Dr. Butch Cashing reached out to neurology guarding this patient with TBI and history of seizures f/b Dr. Mason Sole outpatient who is admitted to [facility] with ankle fracture and has been altered and dysarthric for the past couple of days.  At home he is on carbamazepine  and lamotrigine  and also has a vagal nerve stimulator.  I reviewed Dr. Carlis Cherry notes and also asked pharmacy to check his prescription fill history.  He takes lamotrigine  ER 300mg  at bedtime and carbamazepine  600mg  bid at home.  Dr. Mason Sole stated in his most recent clinic note that patient was previously intolerant of lamotrigine  at a higher dose than this.  Since admission patient has been taking home dose carbamazepine  600mg  bid and lamotrigine  300mg  bid which is double his home dose and likely explains his AMS and slurred speech.  Recommendations: - Continue carbamazepine  600mg  bid - Hold lamotrigine  for now - I will place a pharmacy consult to assist with checking lamotrigine  levels and recommending to primary team when to restart this at 150mg  bid or 300mg  ER at bedtime (d/w pharmacist by phone) - Lamotrigine  level ordered for this afternoon  D/w Dr. Butch Cashing by phone. Please reach back out to formally consult neurology if patient's AMS does not improve after correction of lamotrigine  dosing or if other neurologic concerns arise.  Greg Leaks, MD [facility] [phone]  If 7pm- 7am, please page neurology on call as listed in [facility]ION.

## 2023-07-08 NOTE — Plan of Care (Signed)
  Problem: Education: Goal: Knowledge of General Education information will improve Description: Including pain rating scale, medication(s)/side effects and non-pharmacologic comfort measures Outcome: Not Progressing   Problem: Health Behavior/Discharge Planning: Goal: Ability to manage health-related needs will improve Outcome: Not Progressing   Problem: Clinical Measurements: Goal: Respiratory complications will improve Outcome: Adequate for Discharge Goal: Cardiovascular complication will be avoided Outcome: Adequate for Discharge   Problem: Activity: Goal: Risk for activity intolerance will decrease Outcome: Progressing

## 2023-07-08 NOTE — Progress Notes (Signed)
 Progress Note   Patient: Jared Grimes ZOX:[id number] DOB: 1965/07/29 DOA: 07/04/2023     4 DOS: the patient was seen and examined on 07/08/2023   Brief hospital course: Jared Grimes is a 58 y.o. male with medical history significant of TBI, s/p vagus nerve stimulator placement, hypertension, hyperlipidemia, stroke, depression, seizure, who presents with fall, pain in right foot and ankle. He was found to have right ankle fracture admitted to [facility] service with orthopedic surgery evaluation. Patient had ORIF procedure currently awaiting SNF placement. Neurology called for altered mentation, slurred speech.  Assessment and Plan: S/p fall unwitnessed Closed right ankle fracture:  s/p Open reduction with internal fixation trimalleolar ankle fracture right ankle, Syndesmotic stabilization and repair right ankle  Continue pain control: prn morphine , percocet and tylenol . Continue Zofran  for nausea. Continue Robaxin , Lidoderm  patch for pain control. Family notified about pain meds being cause of confusion. Previously they were held with out improvement. PT/ OT advised SNF placement. TOC working on placement.   AKI Resolved. S/p IV fluid for hydration Foley catheter placed 07/07/23 for urinary retention. Continue Flomax .  Confusion, dysarthria Seizure disorder (HCC) History of stroke:  CT head no acute stroke or intracranial bleed. Old infarcts, chronic small vessel disease noted. Did mention to family regarding his pain medications, muscle relaxants possible causing confusion. Previously they were held with out improvement. Neurology evaluation for further work up. Continue seizure precaution As needed Ativan for seizure. Continue Lipitor. Continue home Tegretol  600 mg twice daily and Lamictal  300 mg twice daily.   Leukocytosis: WBC 14.0 Likely due to stress. WBC improved.    Hypertension:  Amlodipine , atenolol , resumed as BP improved. Continue IV hydralazine  as needed. Monitor BP and  titrate medications accordingly   Depression: Continue Cymbalta .   Obesity class I BMI 31.07 kg/m.  Encourage to lose some weight Exercise and healthy diet     Assist with feeding. Out of bed to chair. Incentive spirometry. Nursing supportive care. Fall, aspiration precautions. Diet:  Diet Orders (From admission, onward)     Start     Ordered   07/06/23 1033  Diet regular Room service appropriate? Yes; Fluid consistency: Thin  Diet effective now       Question Answer Comment  Room service appropriate? Yes   Fluid consistency: Thin      07/06/23 1032           DVT prophylaxis: enoxaparin  (LOVENOX ) injection 40 mg Start: 07/06/23 1800 SCDs Start: 07/04/23 1916  Level of care: Med-Surg   Code Status: Full Code  Subjective: Patient is seen and examined today morning. He is lying in bed. Confused, has impulsive movements of his hands. Unable to provide much history.  Physical Exam: Vitals:   07/08/23 0330 07/08/23 0332 07/08/23 0828 07/08/23 0911  BP: (!) 151/75  (!) 137/103 (!) 137/103  Pulse: 84  87 87  Resp: 19  18   Temp: (!) 96.9 F (36.1 C)  98.5 F (36.9 C)   TempSrc:   Oral   SpO2: 98%  90%   Weight:  92.7 kg    Height:        General - Middle aged Caucasian male, no apparent distress HEENT - PERRLA, EOMI, atraumatic head, non tender sinuses. Lung - Clear, basal rales, no rhonchi, wheezes. Heart - S1, S2 heard, no murmurs, rubs, trace pedal edema. Abdomen - Soft, non tender, obese, foley intact. Neuro - alert, awake, slow mentation, mild dysarthria, impulsive movements of extremities. Skin - Warm and dry.  Right leg cast, dressing  Data Reviewed:      Latest Ref Rng & Units 07/08/2023    5:00 AM 07/07/2023    4:17 AM 07/06/2023    5:39 AM  CBC  WBC 4.0 - 10.5 K/uL 9.3  9.4  10.2   Hemoglobin 13.0 - 17.0 g/dL 16.1  09.6  04.5   Hematocrit 39.0 - 52.0 % 36.3  33.9  36.3   Platelets 150 - 400 K/uL 260  256  252       Latest Ref Rng & Units  07/08/2023    5:00 AM 07/07/2023    4:17 AM 07/06/2023    5:39 AM  BMP  Glucose 70 - 99 mg/dL 98  98  98   BUN 6 - 20 mg/dL 21  22  29    Creatinine 0.61 - 1.24 mg/dL 4.09  8.11  9.14   Sodium 135 - 145 mmol/L 133  134  134   Potassium 3.5 - 5.1 mmol/L 4.3  4.0  4.3   Chloride 98 - 111 mmol/L 97  97  101   CO2 22 - 32 mmol/L 28  27  25    Calcium  8.9 - 10.3 mg/dL 8.4  8.0  8.1    CT HEAD WO CONTRAST ( ) Result Date: 07/07/2023 CLINICAL DATA:  Initial evaluation for acute mental status change, unknown cause. EXAM: CT HEAD WITHOUT CONTRAST TECHNIQUE: Contiguous axial images were obtained from the base of the skull through the vertex without intravenous contrast. RADIATION DOSE REDUCTION: This exam was performed according to the departmental dose-optimization program which includes automated exposure control, adjustment of the mA and/or kV according to patient size and/or use of iterative reconstruction technique. COMPARISON:  CT from 07/04/2023 FINDINGS: Brain: Frontal lobe predominant cerebral atrophy. Associated chronic microvascular ischemic disease with a few small remote lacunar infarcts about the deep gray nuclei. Chronic bilateral frontal lobe infarcts noted. Appearance is stable from prior. No acute intracranial hemorrhage. No acute large vessel territory infarct. No mass lesion or midline shift. No hydrocephalus or extra-axial fluid collection. Vascular: No abnormal hyperdense vessel. Calcified atherosclerosis present at the skull base. Skull: Scalp soft tissues within normal limits.  Calvarium intact. Sinuses/Orbits: Globes orbital soft tissues demonstrate no acute finding. Paranasal sinuses and mastoid air cells are largely clear. Other: None. IMPRESSION: 1. No acute intracranial abnormality. 2. Frontal lobe predominant cerebral atrophy with chronic small vessel ischemic disease, with chronic bilateral frontal lobe infarcts. Electronically Signed   By: Virgia Griffins M.D.   On: 07/07/2023  18:25   Family Discussion- Discussed with sister, son over phone. Sister upset about his care. He is disabled and need more attention and family wishes to go to a facility where he can be taken care.   Disposition: Status is: Inpatient Remains inpatient appropriate because: Neurology evaluation, SNF placement.  Planned Discharge Destination: Skilled nursing facility     Time spent: 41 minutes  Author: Aisha Hove, MD 07/08/2023 12:46 PM Secure chat 7am to 7pm For on call review [email or web address].

## 2023-07-09 DIAGNOSIS — S82891A Other fracture of right lower leg, initial encounter for closed fracture: Secondary | ICD-10-CM | POA: Diagnosis not present

## 2023-07-09 LAB — CBC
HCT: 39.4 % (ref 39.0–52.0)
Hemoglobin: 12.9 g/dL — ABNORMAL LOW (ref 13.0–17.0)
MCH: 30.5 pg (ref 26.0–34.0)
MCHC: 32.7 g/dL (ref 30.0–36.0)
MCV: 93.1 fL (ref 80.0–100.0)
Platelets: 266 10*3/uL (ref 150–400)
RBC: 4.23 MIL/uL (ref 4.22–5.81)
RDW: 12.8 % (ref 11.5–15.5)
WBC: 9.6 10*3/uL (ref 4.0–10.5)
nRBC: 0 % (ref 0.0–0.2)

## 2023-07-09 LAB — TYPE AND SCREEN
ABO/RH(D): A POS
Antibody Screen: POSITIVE
Unit division: 0
Unit division: 0

## 2023-07-09 LAB — BASIC METABOLIC PANEL WITH GFR
Anion gap: 10 (ref 5–15)
BUN: 23 mg/dL — ABNORMAL HIGH (ref 6–20)
CO2: 26 mmol/L (ref 22–32)
Calcium: 8.5 mg/dL — ABNORMAL LOW (ref 8.9–10.3)
Chloride: 96 mmol/L — ABNORMAL LOW (ref 98–111)
Creatinine, Ser: 0.8 mg/dL (ref 0.61–1.24)
GFR, Estimated: >60 mL/min (ref >60)
Glucose, Bld: 99 mg/dL (ref 70–99)
Potassium: 3.9 mmol/L (ref 3.5–5.1)
Sodium: 132 mmol/L — ABNORMAL LOW (ref 135–145)

## 2023-07-09 LAB — BPAM RBC
Blood Product Expiration Date: 202505232359
Blood Product Expiration Date: 202506052359
Unit Type and Rh: 5100
Unit Type and Rh: 6200

## 2023-07-09 LAB — OSMOLALITY: Osmolality: 284 mosm/kg (ref 275–295)

## 2023-07-09 MED ORDER — SODIUM CHLORIDE 1 G PO TABS
1.0000 g | ORAL_TABLET | Freq: Two times a day (BID) | ORAL | Status: DC
  Administered 2023-07-09 – 2023-07-10 (×2): 1 g via ORAL
  Filled 2023-07-09 (×2): qty 1

## 2023-07-09 NOTE — Progress Notes (Signed)
 Patient much improved today.  Much less confused.  Remembered me as the surgeon.  Complaints of pain to the right lower extremity.  Splint is intact.  From podiatry standpoint patient is stable for discharge.  Should remain nonweightbearing to his right foot.  Keep the dressing clean dry and intact.  He should follow-up with me later this week for dressing change and resplint application.
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
## 2023-07-09 NOTE — Progress Notes (Signed)
 Progress Note   Patient: Jared Grimes [id number]:[id number] DOB: 1965/04/30 DOA: 07/04/2023     5 DOS: the patient was seen and examined on 07/09/2023   Brief hospital course: Jared Grimes is a 58 y.o. male with medical history significant of TBI, s/p vagus nerve stimulator placement, hypertension, hyperlipidemia, stroke, depression, seizure, who presents with fall, pain in right foot and ankle. He was found to have right ankle fracture admitted to [facility] service with orthopedic surgery evaluation. Patient had ORIF procedure currently awaiting SNF placement. Neurology called for altered mentation, slurred speech.  Assessment and Plan: S/p fall unwitnessed Closed right ankle fracture:  s/p Open reduction with internal fixation trimalleolar ankle fracture right ankle, Syndesmotic stabilization and repair right ankle  Continue pain control: prn morphine , percocet and tylenol . Continue Zofran  for nausea. Continue Robaxin , Lidoderm  patch for pain control. Family notified about pain meds being cause of confusion. Previously they were held with out improvement. PT/ OT advised SNF placement. TOC working on placement.   AKI Resolved. S/p IV fluid for hydration Foley catheter placed 07/07/23 for urinary retention. Continue Flomax .  Confusion, dysarthria Seizure disorder (HCC) History of stroke:  CT head no acute stroke or intracranial bleed. Old infarcts, chronic small vessel disease noted. Did mention to family regarding his pain medications, muscle relaxants possible causing confusion. Previously they were held with out improvement. Neurology evaluation for further work up. Continue seizure precaution As needed Ativan for seizure. Continue Lipitor. Continue home Tegretol  600 mg twice daily  Held Lamictal  300 mg twice daily home dose, check revel and then restart as per pharmacy, neurology is also following.   Leukocytosis: WBC 14.0 Likely due to stress. WBC improved.    Hypertension:  Amlodipine ,  atenolol , resumed as BP improved. Continue IV hydralazine  as needed. Monitor BP and titrate medications accordingly   Depression: Continue Cymbalta .  Isotonic hyponatremia, serum osmolarity 284 WNL Started salt tablets Monitor sodium level daily  Obesity class I BMI 31.07 kg/m.  Encourage to lose some weight Exercise and healthy diet     Assist with feeding. Out of bed to chair. Incentive spirometry. Nursing supportive care. Fall, aspiration precautions. Diet:  Diet Orders (From admission, onward)     Start     Ordered   07/06/23 1033  Diet regular Room service appropriate? Yes; Fluid consistency: Thin  Diet effective now       Question Answer Comment  Room service appropriate? Yes   Fluid consistency: Thin      07/06/23 1032           DVT prophylaxis: enoxaparin  (LOVENOX ) injection 40 mg Start: 07/06/23 1800 SCDs Start: 07/04/23 1916  Level of care: Med-Surg   Code Status: Full Code  Subjective: No significant events overnight, patient is AO x 3, does not remember what month visit.  Denies any other complaints, pain is well-controlled.   Physical Exam: Vitals:   07/08/23 2018 07/09/23 0502 07/09/23 0822 07/09/23 0951  BP: 138/85 (!) 125/102 (!) 154/96 (!) 154/96  Pulse: 78 78 80 80  Resp: 18 18 17    Temp: 98.2 F (36.8 C) 97.6 F (36.4 C) 98.2 F (36.8 C)   TempSrc: Oral Oral    SpO2: 91% 99% 100%   Weight:      Height:        General - Middle aged Caucasian male, no apparent distress HEENT - PERRLA, EOMI, atraumatic head, non tender sinuses. Lung - Clear, basal rales, no rhonchi, wheezes. Heart - S1, S2 heard, no murmurs,  rubs, trace pedal edema. Abdomen - Soft, non tender, obese, foley intact. Neuro - alert, awake, slow mentation, mild dysarthria, impulsive movements of extremities.  Left hemiparesis, residual weakness due to prior stroke Skin - Warm and dry. Right leg cast, dressing  Data Reviewed:      Latest Ref Rng & Units 07/09/2023     5:23 AM 07/08/2023    5:00 AM 07/07/2023    4:17 AM  CBC  WBC 4.0 - 10.5 K/uL 9.6  9.3  9.4   Hemoglobin 13.0 - 17.0 g/dL 56.4  33.2  95.1   Hematocrit 39.0 - 52.0 % 39.4  36.3  33.9   Platelets 150 - 400 K/uL 266  260  256       Latest Ref Rng & Units 07/09/2023    5:23 AM 07/08/2023    5:00 AM 07/07/2023    4:17 AM  BMP  Glucose 70 - 99 mg/dL 99  98  98   BUN 6 - 20 mg/dL 23  21  22    Creatinine 0.61 - 1.24 mg/dL 8.84  1.66  0.63   Sodium 135 - 145 mmol/L 132  133  134   Potassium 3.5 - 5.1 mmol/L 3.9  4.3  4.0   Chloride 98 - 111 mmol/L 96  97  97   CO2 22 - 32 mmol/L 26  28  27    Calcium  8.9 - 10.3 mg/dL 8.5  8.4  8.0    CT HEAD WO CONTRAST ( ) Result Date: 07/07/2023 CLINICAL DATA:  Initial evaluation for acute mental status change, unknown cause. EXAM: CT HEAD WITHOUT CONTRAST TECHNIQUE: Contiguous axial images were obtained from the base of the skull through the vertex without intravenous contrast. RADIATION DOSE REDUCTION: This exam was performed according to the departmental dose-optimization program which includes automated exposure control, adjustment of the mA and/or kV according to patient size and/or use of iterative reconstruction technique. COMPARISON:  CT from 07/04/2023 FINDINGS: Brain: Frontal lobe predominant cerebral atrophy. Associated chronic microvascular ischemic disease with a few small remote lacunar infarcts about the deep gray nuclei. Chronic bilateral frontal lobe infarcts noted. Appearance is stable from prior. No acute intracranial hemorrhage. No acute large vessel territory infarct. No mass lesion or midline shift. No hydrocephalus or extra-axial fluid collection. Vascular: No abnormal hyperdense vessel. Calcified atherosclerosis present at the skull base. Skull: Scalp soft tissues within normal limits.  Calvarium intact. Sinuses/Orbits: Globes orbital soft tissues demonstrate no acute finding. Paranasal sinuses and mastoid air cells are largely clear. Other:  None. IMPRESSION: 1. No acute intracranial abnormality. 2. Frontal lobe predominant cerebral atrophy with chronic small vessel ischemic disease, with chronic bilateral frontal lobe infarcts. Electronically Signed   By: Virgia Griffins M.D.   On: 07/07/2023 18:25   Family Discussion- Discussed with sister, son over phone. Sister upset about his care. He is disabled and need more attention and family wishes to go to a facility where he can be taken care.   Disposition: Status is: Inpatient Remains inpatient appropriate because: Neurology evaluation, SNF placement.  Planned Discharge Destination: Skilled nursing facility Awaiting for Lamictal  level and then DC plan in 1 to 2 days     Time spent: 45 minutes  Author: Althia Atlas, MD 07/09/2023 12:34 PM Secure chat 7am to 7pm For on call review [email or web address].

## 2023-07-09 NOTE — Discharge Instructions (Signed)
[facility]  POST OPERATIVE INSTRUCTIONS FOR DR. Ether Griffins AND DR. BAKER [facility] PODIATRY DEPARTMENT   Take your medication as prescribed.  Pain medication should be taken only as needed.  Keep the dressing clean, dry and intact.  Keep your foot elevated above the heart level for the first 48 hours.  We have instructed you to be non-weight bearing.  Always wear your post-op shoe when walking.  Always use your crutches if you are to be non-weight bearing.  Do not take a shower. Baths are permissible as long as the foot is kept out of the water.   Every hour you are awake:  Bend your knee 15 times.   Call [facility] [phone]) if any of the following problems occur: You develop a temperature or fever. The bandage becomes saturated with blood. Medication does not stop your pain. Injury of the foot occurs. Any symptoms of infection including redness, odor, or red streaks running from wound.

## 2023-07-10 DIAGNOSIS — S82891A Other fracture of right lower leg, initial encounter for closed fracture: Secondary | ICD-10-CM | POA: Diagnosis not present

## 2023-07-10 LAB — URINALYSIS, W/ REFLEX TO CULTURE (INFECTION SUSPECTED)
RBC / HPF: >50 RBC/hpf (ref 0–5)
Squamous Epithelial / HPF: 0 /HPF (ref 0–5)

## 2023-07-10 LAB — BASIC METABOLIC PANEL WITH GFR
Anion gap: 7 (ref 5–15)
BUN: 26 mg/dL — ABNORMAL HIGH (ref 6–20)
CO2: 26 mmol/L (ref 22–32)
Calcium: 8.2 mg/dL — ABNORMAL LOW (ref 8.9–10.3)
Chloride: 96 mmol/L — ABNORMAL LOW (ref 98–111)
Creatinine, Ser: 0.73 mg/dL (ref 0.61–1.24)
GFR, Estimated: >60 mL/min (ref >60)
Glucose, Bld: 88 mg/dL (ref 70–99)
Potassium: 3.7 mmol/L (ref 3.5–5.1)
Sodium: 129 mmol/L — ABNORMAL LOW (ref 135–145)

## 2023-07-10 LAB — CBC
HCT: 36.4 % — ABNORMAL LOW (ref 39.0–52.0)
Hemoglobin: 12.3 g/dL — ABNORMAL LOW (ref 13.0–17.0)
MCH: 30.9 pg (ref 26.0–34.0)
MCHC: 33.8 g/dL (ref 30.0–36.0)
MCV: 91.5 fL (ref 80.0–100.0)
Platelets: 284 10*3/uL (ref 150–400)
RBC: 3.98 MIL/uL — ABNORMAL LOW (ref 4.22–5.81)
RDW: 12.4 % (ref 11.5–15.5)
WBC: 7.7 10*3/uL (ref 4.0–10.5)
nRBC: 0 % (ref 0.0–0.2)

## 2023-07-10 LAB — LAMOTRIGINE LEVEL: Lamotrigine Lvl: 10.9 ug/mL (ref 2.0–20.0)

## 2023-07-10 MED ORDER — SODIUM CHLORIDE 1 G PO TABS
2.0000 g | ORAL_TABLET | Freq: Two times a day (BID) | ORAL | Status: DC
  Administered 2023-07-10 – 2023-07-11 (×3): 2 g via ORAL
  Filled 2023-07-10 (×3): qty 2

## 2023-07-10 NOTE — TOC Progression Note (Signed)
 Transition of Care [facility]) - Progression Note    Patient Details  Name: Jared Grimes MRN: [id number] Date of Birth: 11-Feb-1966  Transition of Care [facility]) CM/SW Contact  Alexandra Ice, RN Phone Number: 07/10/2023, 2:46 PM  Clinical Narrative:     Arthuro Billow, son, discussed SNF choices. Provided him the bed offers. He asked about [facility], TOC explained they declined as they are out of network with insurance. He stated he will discuss with family and will call back TOC with choice.        Expected Discharge Plan and Services                                               Social Determinants of Health (SDOH) Interventions SDOH Screenings   Food Insecurity: No Food Insecurity (07/04/2023)  Housing: Low Risk  (07/04/2023)  Transportation Needs: No Transportation Needs (07/04/2023)  Utilities: Not At Risk (07/04/2023)  Financial Resource Strain: Low Risk  (01/02/2023)   Received from [facility]  Social Connections: Patient Declined (07/04/2023)  Tobacco Use: Low Risk  (07/04/2023)    Readmission Risk Interventions     No data to display

## 2023-07-10 NOTE — Progress Notes (Signed)
 Progress Note   Patient: Jared Grimes [id number] DOB: 1965-03-26 DOA: 07/04/2023     6 DOS: the patient was seen and examined on 07/10/2023   Brief hospital course: Jared Grimes is a 58 y.o. male with medical history significant of TBI, s/p vagus nerve stimulator placement, hypertension, hyperlipidemia, stroke, depression, seizure, who presents with fall, pain in right foot and ankle. He was found to have right ankle fracture admitted to [facility] service with orthopedic surgery evaluation. Patient had ORIF procedure currently awaiting SNF placement. Neurology called for altered mentation, slurred speech.  Assessment and Plan: S/p fall unwitnessed Closed right ankle fracture:  s/p Open reduction with internal fixation trimalleolar ankle fracture right ankle, Syndesmotic stabilization and repair right ankle  Continue pain control: prn morphine , percocet and tylenol . Continue Zofran  for nausea. Continue Robaxin , Lidoderm  patch for pain control. Family notified about pain meds being cause of confusion. Previously they were held with out improvement. PT/ OT advised SNF placement. TOC working on placement.   AKI Resolved. S/p IV fluid for hydration Foley catheter placed 07/07/23 for urinary retention. Continue Flomax .  Hematuria noticed on 5/12, possible due to trauma secondary to Foley H&H stable Continue to monitor  Confusion, dysarthria Seizure disorder (HCC) History of stroke:  CT head no acute stroke or intracranial bleed. Old infarcts, chronic small vessel disease noted. Did mention to family regarding his pain medications, muscle relaxants possible causing confusion. Previously they were held with out improvement. Neurology evaluation for further work up. Continue seizure precaution As needed Ativan for seizure. Continue Lipitor. Continue home Tegretol  600 mg twice daily  Held Lamictal  300 mg twice daily home dose, check revel and then restart as per pharmacy, neurology is also  following.   Leukocytosis: WBC 14.0 Likely due to stress. WBC improved.    Hypertension:  Amlodipine , atenolol , resumed as BP improved. Continue IV hydralazine  as needed. Monitor BP and titrate medications accordingly   Depression: Continue Cymbalta .  Isotonic hyponatremia, serum osmolarity 284 WNL Started salt tablets Monitor sodium level daily  Obesity class I BMI 31.07 kg/m.  Encourage to lose some weight Exercise and healthy diet     Assist with feeding. Out of bed to chair. Incentive spirometry. Nursing supportive care. Fall, aspiration precautions. Diet:  Diet Orders (From admission, onward)     Start     Ordered   07/06/23 1033  Diet regular Room service appropriate? Yes; Fluid consistency: Thin  Diet effective now       Question Answer Comment  Room service appropriate? Yes   Fluid consistency: Thin      07/06/23 1032           DVT prophylaxis: enoxaparin  (LOVENOX ) injection 40 mg Start: 07/06/23 1800 SCDs Start: 07/04/23 1916  Level of care: Med-Surg   Code Status: Full Code  Subjective: No significant events overnight, patient was sleepy, slightly confused.  Patient was resting comfortably without any acute distress, denied any complaints.   Physical Exam: Vitals:   07/10/23 0637 07/10/23 0641 07/10/23 0726 07/10/23 1508  BP: (!) 160/91 (!) 160/91 (!) 155/86 115/72  Pulse: 74  73 63  Resp: 16  18 17   Temp:   97.8 F (36.6 C) 98.3 F (36.8 C)  TempSrc:   Oral   SpO2: 98%  97% 95%  Weight:      Height:        General - Middle aged Caucasian male, no apparent distress HEENT - PERRLA, EOMI, atraumatic head, non tender sinuses. Lung - Clear,  basal rales, no rhonchi, wheezes. Heart - S1, S2 heard, no murmurs, rubs, trace pedal edema. Abdomen - Soft, non tender, obese, foley intact. Neuro - alert, awake, slow mentation, mild dysarthria, impulsive movements of extremities.  Left hemiparesis, residual weakness due to prior stroke Skin - Warm  and dry. Right leg cast, dressing  Data Reviewed:      Latest Ref Rng & Units 07/10/2023    4:37 AM 07/09/2023    5:23 AM 07/08/2023    5:00 AM  CBC  WBC 4.0 - 10.5 K/uL 7.7  9.6  9.3   Hemoglobin 13.0 - 17.0 g/dL 25.9  56.3  87.5   Hematocrit 39.0 - 52.0 % 36.4  39.4  36.3   Platelets 150 - 400 K/uL 284  266  260       Latest Ref Rng & Units 07/10/2023    4:37 AM 07/09/2023    5:23 AM 07/08/2023    5:00 AM  BMP  Glucose 70 - 99 mg/dL 88  99  98   BUN 6 - 20 mg/dL 26  23  21    Creatinine 0.61 - 1.24 mg/dL 6.43  3.29  5.18   Sodium 135 - 145 mmol/L 129  132  133   Potassium 3.5 - 5.1 mmol/L 3.7  3.9  4.3   Chloride 98 - 111 mmol/L 96  96  97   CO2 22 - 32 mmol/L 26  26  28    Calcium  8.9 - 10.3 mg/dL 8.2  8.5  8.4    No results found.  Family Discussion- Discussed with sister, son over phone. Sister upset about his care. He is disabled and need more attention and family wishes to go to a facility where he can be taken care.   Disposition: Status is: Inpatient Remains inpatient appropriate because: Neurology evaluation, SNF placement.  Planned Discharge Destination: Skilled nursing facility Awaiting for Lamictal  level and then DC plan in 1 to 2 days     Time spent: 45 minutes  Author: Althia Atlas, MD 07/10/2023 3:17 PM Secure chat 7am to 7pm For on call review [email or web address].

## 2023-07-10 NOTE — TOC Progression Note (Signed)
 Transition of Care [facility]) - Progression Note    Patient Details  Name: Jared Grimes MRN: [id number] Date of Birth: 08/06/1965  Transition of [facility]) CM/SW Contact  Alexandra Ice, RN Phone Number: 07/10/2023, 4:18 PM  Clinical Narrative:      Met with patient and sister at bedside. She inquiring about SNF bed offers, TOC explained spoke with [place], son, and discussed choices. [place] to call TOC back with choice. She asked why [facility] did not accept her brother. TOC explained process and sometimes not reason is giving however, facility is not in network with patient's insurance. Family had several questions regarding his insurance, questions answered. Stated she and her husband will visit other facilities and speak with [facility]. She was asking about how she can have access to patient's MyChart or request his medical records. Explained that son is patient's next of kin and decision maker if he is cognitively impaired, she verbalized understanding and stated that patient was not leaving until she had access to his medical records and could read the notes from physical therapy and doctors. Family feels he has not gotten any therapy since admission. TOC explained SNF was recommended by therapy and therapy does work with patients, but not as extensive as a SNF (skilled nursing facility) would, which is why it was recommended.       Expected Discharge Plan and Services                                               Social Determinants of Health (SDOH) Interventions SDOH Screenings   Food Insecurity: No Food Insecurity (07/04/2023)  Housing: Low Risk  (07/04/2023)  Transportation Needs: No Transportation Needs (07/04/2023)  Utilities: Not At Risk (07/04/2023)  Financial Resource Strain: Low Risk  (01/02/2023)   Received from [facility]  Social Connections: Patient Declined (07/04/2023)  Tobacco Use: Low Risk  (07/04/2023)    Readmission Risk Interventions      No data to display

## 2023-07-10 NOTE — Plan of Care (Signed)
  Problem: Clinical Measurements: Goal: Diagnostic test results will improve Outcome: Progressing   Problem: Nutrition: Goal: Adequate nutrition will be maintained Outcome: Progressing   Problem: Coping: Goal: Level of anxiety will decrease Outcome: Progressing   Problem: Pain Managment: Goal: General experience of comfort will improve and/or be controlled Outcome: Progressing

## 2023-07-11 DIAGNOSIS — S82891A Other fracture of right lower leg, initial encounter for closed fracture: Secondary | ICD-10-CM | POA: Diagnosis not present

## 2023-07-11 LAB — BASIC METABOLIC PANEL WITH GFR
Anion gap: 7 (ref 5–15)
BUN: 17 mg/dL (ref 6–20)
CO2: 24 mmol/L (ref 22–32)
Calcium: 8.3 mg/dL — ABNORMAL LOW (ref 8.9–10.3)
Chloride: 98 mmol/L (ref 98–111)
Creatinine, Ser: 0.71 mg/dL (ref 0.61–1.24)
GFR, Estimated: >60 mL/min (ref >60)
Glucose, Bld: 121 mg/dL — ABNORMAL HIGH (ref 70–99)
Potassium: 4.2 mmol/L (ref 3.5–5.1)
Sodium: 129 mmol/L — ABNORMAL LOW (ref 135–145)

## 2023-07-11 LAB — CBC
HCT: 39.3 % (ref 39.0–52.0)
Hemoglobin: 13.4 g/dL (ref 13.0–17.0)
MCH: 31.3 pg (ref 26.0–34.0)
MCHC: 34.1 g/dL (ref 30.0–36.0)
MCV: 91.8 fL (ref 80.0–100.0)
Platelets: 336 10*3/uL (ref 150–400)
RBC: 4.28 MIL/uL (ref 4.22–5.81)
RDW: 12.5 % (ref 11.5–15.5)
WBC: 9.3 10*3/uL (ref 4.0–10.5)
nRBC: 0 % (ref 0.0–0.2)

## 2023-07-11 MED ORDER — LAMOTRIGINE ER 100 MG PO TB24
300.0000 mg | ORAL_TABLET | Freq: Every day | ORAL | Status: DC
  Administered 2023-07-11 – 2023-07-18 (×8): 300 mg via ORAL
  Filled 2023-07-11 (×9): qty 3

## 2023-07-11 NOTE — Progress Notes (Signed)
 Progress Note   Patient: Jared Grimes [id number] DOB: [birth year] DOA: 07/04/2023     7 DOS: the patient was seen and examined on 07/11/2023   Brief hospital course: Jared Grimes is a 58 y.o. male with medical history significant of TBI, s/p vagus nerve stimulator placement, hypertension, hyperlipidemia, stroke, depression, seizure, who presents with fall, pain in right foot and ankle. He was found to have right ankle fracture admitted to [facility] service with orthopedic surgery evaluation. Patient had ORIF procedure currently awaiting SNF placement. Neurology called for altered mentation, slurred speech.  Assessment and Plan: S/p fall unwitnessed Closed right ankle fracture:  s/p Open reduction with internal fixation trimalleolar ankle fracture right ankle, Syndesmotic stabilization and repair right ankle  Continue pain control: prn morphine , percocet and tylenol . Continue Zofran  for nausea. Continue Robaxin , Lidoderm  patch for pain control. Family notified about pain meds being cause of confusion. Previously they were held with out improvement. PT/ OT advised SNF placement. TOC working on placement.   AKI Resolved. S/p IV fluid for hydration Foley catheter placed 07/07/23 for urinary retention. Continue Flomax .  Hematuria noticed on 5/12, possible due to trauma secondary to Foley 5/13 hematuria resolved H&H stable Continue to monitor  Confusion, dysarthria Seizure disorder (HCC) History of stroke:  CT head no acute stroke or intracranial bleed. Old infarcts, chronic small vessel disease noted. Did mention to family regarding his pain medications, muscle relaxants possible causing confusion. Previously they were held with out improvement. Neurology evaluation for further work up. Continue seizure precaution As needed Ativan for seizure. Continue Lipitor. Continue home Tegretol  600 mg twice daily  Held Lamictal  300 mg twice daily home dose,  5/13 Lamictal  level within normal  range, resumed Lamictal  at home dose.   Leukocytosis: WBC 14.0 Likely due to stress. WBC improved.    Hypertension:  Amlodipine , atenolol , resumed as BP improved. Continue IV hydralazine  as needed. Monitor BP and titrate medications accordingly   Depression: Continue Cymbalta .  Isotonic hyponatremia, serum osmolarity 284 WNL Started salt tablets Monitor sodium level daily  Obesity class I BMI 31.07 kg/m.  Encourage to lose some weight Exercise and healthy diet     Assist with feeding. Out of bed to chair. Incentive spirometry. Nursing supportive care. Fall, aspiration precautions. Diet:  Diet Orders (From admission, onward)     Start     Ordered   07/06/23 1033  Diet regular Room service appropriate? Yes; Fluid consistency: Thin  Diet effective now       Question Answer Comment  Room service appropriate? Yes   Fluid consistency: Thin      07/06/23 1032           DVT prophylaxis: enoxaparin  (LOVENOX ) injection 40 mg Start: 07/06/23 1800 SCDs Start: 07/04/23 1916  Level of care: Med-Surg   Code Status: Full Code  Subjective: No significant events overnight, patient was resting comfortably in the recliner, stated that pain is under control, he does not feel anything right now.   Physical Exam: Vitals:   07/10/23 2040 07/11/23 0336 07/11/23 0500 07/11/23 0746  BP: 124/74 133/81  (!) 133/90  Pulse: 76 65  71  Resp: 18 18  17   Temp: 98.3 F (36.8 C) 97.7 F (36.5 C)  98.1 F (36.7 C)  TempSrc: Oral Oral    SpO2: 99% 97%  98%  Weight:   90.9 kg   Height:        General - Middle aged Caucasian male, no apparent distress HEENT - PERRLA, EOMI,  atraumatic head, non tender sinuses. Lung - Clear, basal rales, no rhonchi, wheezes. Heart - S1, S2 heard, no murmurs, rubs, trace pedal edema. Abdomen - Soft, non tender, obese, foley intact. Neuro - alert, awake, slow mentation, mild dysarthria, impulsive movements of extremities.  Left hemiparesis, residual  weakness due to prior stroke Skin - Warm and dry. Right leg cast, dressing  Data Reviewed:      Latest Ref Rng & Units 07/11/2023   10:48 AM 07/10/2023    4:37 AM 07/09/2023    5:23 AM  CBC  WBC 4.0 - 10.5 K/uL 9.3  7.7  9.6   Hemoglobin 13.0 - 17.0 g/dL 29.5  62.1  30.8   Hematocrit 39.0 - 52.0 % 39.3  36.4  39.4   Platelets 150 - 400 K/uL 336  284  266       Latest Ref Rng & Units 07/11/2023   10:48 AM 07/10/2023    4:37 AM 07/09/2023    5:23 AM  BMP  Glucose 70 - 99 mg/dL 657  88  99   BUN 6 - 20 mg/dL 17  26  23    Creatinine 0.61 - 1.24 mg/dL 8.46  9.62  9.52   Sodium 135 - 145 mmol/L 129  129  132   Potassium 3.5 - 5.1 mmol/L 4.2  3.7  3.9   Chloride 98 - 111 mmol/L 98  96  96   CO2 22 - 32 mmol/L 24  26  26    Calcium  8.9 - 10.3 mg/dL 8.3  8.2  8.5    No results found.  Family Discussion- Discussed with sister, son over phone. Sister upset about his care. He is disabled and need more attention and family wishes to go to a facility where he can be taken care.   Disposition: Status is: Inpatient Remains inpatient appropriate because: Neurology evaluation, SNF placement.  Planned Discharge Destination: Skilled nursing facility  Clinically stable, medically optimized to discharge. DC to SNF when bed will be available.     Time spent: 45 minutes  Author: Althia Atlas, MD 07/11/2023 4:31 PM Secure chat 7am to 7pm For on call review [email or web address].

## 2023-07-11 NOTE — Plan of Care (Signed)
  Problem: Clinical Measurements: Goal: Diagnostic test results will improve Outcome: Progressing   Problem: Nutrition: Goal: Adequate nutrition will be maintained Outcome: Progressing   Problem: Coping: Goal: Level of anxiety will decrease Outcome: Progressing   Problem: Elimination: Goal: Will not experience complications related to bowel motility Outcome: Progressing   Problem: Pain Managment: Goal: General experience of comfort will improve and/or be controlled Outcome: Progressing   Problem: Safety: Goal: Ability to remain free from injury will improve Outcome: Progressing
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
## 2023-07-11 NOTE — Progress Notes (Signed)
 Nutrition Follow-up  DOCUMENTATION CODES:   Obesity unspecified  INTERVENTION:   -Continue Ensure Enlive po TID, each supplement provides 350 kcal and 20 grams of protein -Continue MVI with minerals daily -Continue regular diet -Continue Magic cup BID with meals, each supplement provides 290 kcal and 9 grams of protein   NUTRITION DIAGNOSIS:   Increased nutrient needs related to post-op healing as evidenced by estimated needs.  Ongoing  GOAL:   Patient will meet greater than or equal to 90% of their needs  Progressing   MONITOR:   PO intake, Supplement acceptance, Labs, Weight trends, I & O's, Skin  REASON FOR ASSESSMENT:   Consult Assessment of nutrition requirement/status  ASSESSMENT:   58 y.o. male with medical history significant of TBI, s/p vagus nerve stimulator placement, hypertension, hyperlipidemia, stroke, depression and seizure who presents with closed right ankle fracture after fall.  5/7- s/p Procedure: 1.  Open reduction with internal fixation trimalleolar ankle fracture right ankle 2.  Syndesmotic stabilization and repair right ankle   Reviewed I/O's: -900 ml x 24 hours and -4 L since admission  UOP: 900 ml x 24 hours  Spoke with pt and RN at bedside. Pt pleasant and in good spirits today; muych more alert and interactive in comparison to prior visit. Pt shares " I know I was in rough shape; I wasn't able to ask for things, but I'm glad that I'm better". Pt shares his appetite has improved and is consuming most of his food. Noted meal completions 20-50%.   RN reports that pt has been consuming Ensure supplements and likes them. Discussed importance of good meal and supplement intake to promote healing. Pt expressed appreciation for visit.   Wt has been stable since admission.   Medications reviewed and include lovenox , protonix , and sodium chloride .   Per TOC notes, plan for SNF placement once medically stable.   Labs reviewed: Na: 129, CBGS: 92  (inpatient orders for glycemic control are none).    Diet Order:   Diet Order             Diet regular Room service appropriate? Yes; Fluid consistency: Thin  Diet effective now                   EDUCATION NEEDS:   Not appropriate for education at this time  Skin:  Skin Assessment: Skin Integrity Issues: Skin Integrity Issues:: Incisions Incisions: rt ankle  Last BM:  07/10/23 (type 3)  Height:   Ht Readings from Last 1 Encounters:  07/04/23 5\' 8"  (1.727 m)    Weight:   Wt Readings from Last 1 Encounters:  07/11/23 90.9 kg    Ideal Body Weight:  70 kg  BMI:  Body mass index is 30.47 kg/m.  Estimated Nutritional Needs:   Kcal:  2000-2300kcal/day  Protein:  100-115g/day  Fluid:  2.1-2.4L/day    Herschel Lords, RD, LDN, CDCES Registered Dietitian III Certified Diabetes Care and Education Specialist If unable to reach this RD, please use "RD Inpatient" group chat on secure chat between hours of 8am-4 pm daily
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
## 2023-07-11 NOTE — TOC Progression Note (Signed)
 Transition of Care [facility]) - Progression Note    Patient Details  Name: Jared Grimes MRN: [id number] Date of Birth: 11-06-1965  Transition of Care [facility]) CM/SW Contact  Alexandra Ice, RN Phone Number: 07/11/2023, 4:45 PM  Clinical Narrative:    Received call back from [facility], stated he would like TOC to do another search for SNF in [place], [place] area for patient. TOC completed bed search per son's request.         Expected Discharge Plan and Services                                               Social Determinants of Health (SDOH) Interventions SDOH Screenings   Food Insecurity: No Food Insecurity (07/04/2023)  Housing: Low Risk  (07/04/2023)  Transportation Needs: No Transportation Needs (07/04/2023)  Utilities: Not At Risk (07/04/2023)  Financial Resource Strain: Low Risk  (01/02/2023)   Received from [facility]  Social Connections: Patient Declined (07/04/2023)  Tobacco Use: Low Risk  (07/04/2023)    Readmission Risk Interventions     No data to display

## 2023-07-12 DIAGNOSIS — S82891A Other fracture of right lower leg, initial encounter for closed fracture: Secondary | ICD-10-CM | POA: Diagnosis not present

## 2023-07-12 LAB — CBC
HCT: 36.5 % — ABNORMAL LOW (ref 39.0–52.0)
Hemoglobin: 12.5 g/dL — ABNORMAL LOW (ref 13.0–17.0)
MCH: 30.8 pg (ref 26.0–34.0)
MCHC: 34.2 g/dL (ref 30.0–36.0)
MCV: 89.9 fL (ref 80.0–100.0)
Platelets: 351 10*3/uL (ref 150–400)
RBC: 4.06 MIL/uL — ABNORMAL LOW (ref 4.22–5.81)
RDW: 12.3 % (ref 11.5–15.5)
WBC: 9.1 10*3/uL (ref 4.0–10.5)
nRBC: 0 % (ref 0.0–0.2)

## 2023-07-12 LAB — BASIC METABOLIC PANEL WITH GFR
Anion gap: 8 (ref 5–15)
BUN: 22 mg/dL — ABNORMAL HIGH (ref 6–20)
CO2: 25 mmol/L (ref 22–32)
Calcium: 8.4 mg/dL — ABNORMAL LOW (ref 8.9–10.3)
Chloride: 96 mmol/L — ABNORMAL LOW (ref 98–111)
Creatinine, Ser: 0.63 mg/dL (ref 0.61–1.24)
GFR, Estimated: >60 mL/min (ref >60)
Glucose, Bld: 95 mg/dL (ref 70–99)
Potassium: 4 mmol/L (ref 3.5–5.1)
Sodium: 129 mmol/L — ABNORMAL LOW (ref 135–145)

## 2023-07-12 MED ORDER — SODIUM CHLORIDE 1 G PO TABS: 2.0000 g | ORAL_TABLET | Freq: Two times a day (BID) | ORAL | Status: DC

## 2023-07-12 MED ORDER — SODIUM CHLORIDE 1 G PO TABS
2.0000 g | ORAL_TABLET | Freq: Two times a day (BID) | ORAL | Status: DC
  Administered 2023-07-12: 2 g via ORAL
  Filled 2023-07-12: qty 2

## 2023-07-12 MED ORDER — SODIUM CHLORIDE 1 G PO TABS
2.0000 g | ORAL_TABLET | Freq: Two times a day (BID) | ORAL | Status: DC
Start: 2023-07-12 — End: 2023-07-15
  Administered 2023-07-12 – 2023-07-13 (×2): 2 g via ORAL
  Filled 2023-07-12 (×2): qty 2
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
## 2023-07-13 DIAGNOSIS — S82891A Other fracture of right lower leg, initial encounter for closed fracture: Secondary | ICD-10-CM | POA: Diagnosis not present

## 2023-07-13 MED ORDER — SODIUM CHLORIDE 1 G PO TABS
2.0000 g | ORAL_TABLET | Freq: Two times a day (BID) | ORAL | Status: AC
  Administered 2023-07-13 – 2023-07-16 (×6): 2 g via ORAL
  Filled 2023-07-13 (×6): qty 2

## 2023-07-13 NOTE — Plan of Care (Signed)
   Problem: Education: Goal: Knowledge of General Education information will improve Description Including pain rating scale, medication(s)/side effects and non-pharmacologic comfort measures Outcome: Progressing

## 2023-07-13 NOTE — Progress Notes (Signed)
 Daily Progress Note   Subjective  - 8 Days Post-Op  Status post right ankle ORIF for bimalleolar fracture.  He is doing well.  He was up with assistance in a wheelchair today with physical therapy.  Pain is only mild at this point.  Objective Vitals:   07/12/23 2013 07/13/23 0419 07/13/23 0500 07/13/23 0747  BP: 136/68 (!) 154/94  (!) 155/58  Pulse: 84 76  85  Resp: 18 18  16   Temp: 97.9 F (36.6 C) 97.8 F (36.6 C)  97.8 F (36.6 C)  TempSrc:  Oral  Oral  SpO2: 99% 97%  98%  Weight:   94.1 kg   Height:        Physical Exam: Dressing removed and changed today.  Incisions are well coapted.  Staples are intact.  No dehiscence at all.  No signs of infection.  Laboratory CBC    Component Value Date/Time   WBC 9.1 07/12/2023 0311   HGB 12.5 (L) 07/12/2023 0311   HGB 11.0 (L) 10/20/2020 1435   HCT 36.5 (L) 07/12/2023 0311   HCT 34.5 (L) 10/20/2020 1435   PLT 351 07/12/2023 0311   PLT 377 10/20/2020 1435    BMET    Component Value Date/Time   NA 129 (L) 07/12/2023 0311   K 4.0 07/12/2023 0311   CL 96 (L) 07/12/2023 0311   CO2 25 07/12/2023 0311   GLUCOSE 95 07/12/2023 0311   BUN 22 (H) 07/12/2023 0311   CREATININE 0.63 07/12/2023 0311   CALCIUM  8.4 (L) 07/12/2023 0311   GFRNONAA >60 07/12/2023 0311   GFRAA >60 03/03/2019 0545    Assessment/Planning: Status post ORIF right ankle fracture  New bandages applied.  Splint reapplied.  Continue with nonweightbearing.  At this point he is stable for discharge from podiatry standpoint.  Should remain nonweightbearing.  No dressing changes needed.  He should follow-up in the office with me in the next 7 to 10 days for dressing removal and likely staple removal.  At that time may be able to transition into a boot.   Anell Baptist A  07/13/2023, 1:16 PM
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
## 2023-07-13 NOTE — TOC Progression Note (Signed)
 Transition of Care [facility]) - Progression Note    Patient Details  Name: Jared Grimes MRN: [id number] Date of Birth: [birth year]  Transition [facility] Care [facility]) CM/SW Contact  Alexandra Ice, RN Phone Number: 07/13/2023, 7:48 AM  Clinical Narrative:     Received message from bedside nurse, patient and family chose [facility]. Accept via HUB and will initiate auth.        Expected Discharge Plan and Services                                               Social Determinants of Health (SDOH) Interventions SDOH Screenings   Food Insecurity: No Food Insecurity (07/04/2023)  Housing: Low Risk  (07/04/2023)  Transportation Needs: No Transportation Needs (07/04/2023)  Utilities: Not At Risk (07/04/2023)  Financial Resource Strain: Low Risk  (01/02/2023)   Received from [facility]  Social Connections: Patient Declined (07/04/2023)  Tobacco Use: Low Risk  (07/04/2023)    Readmission Risk Interventions     No data to display

## 2023-07-13 NOTE — TOC Progression Note (Signed)
 Transition of Care [facility]) - Progression Note    Patient Details  Name: ARYEH GASPARIAN MRN: [id number] Date of Birth: January 02, 1966  Transition of Care [facility]) CM/SW Contact  Alexandra Ice, RN Phone Number: 07/13/2023, 2:50 PM  Clinical Narrative:     Howard Macho approval, Approved PlanAuthID: [id number] Dates: 5/15-5/19/25 Next Review Date: 5/196/25 for [facility]. Gina from [facility] stated they can accept patient tomorrow.        Expected Discharge Plan and Services                                               Social Determinants of Health (SDOH) Interventions SDOH Screenings   Food Insecurity: No Food Insecurity (07/04/2023)  Housing: Low Risk  (07/04/2023)  Transportation Needs: No Transportation Needs (07/04/2023)  Utilities: Not At Risk (07/04/2023)  Financial Resource Strain: Low Risk  (01/02/2023)   Received from [facility]  Social Connections: Patient Declined (07/04/2023)  Tobacco Use: Low Risk  (07/04/2023)    Readmission Risk Interventions     No data to display
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
## 2023-07-13 NOTE — TOC Progression Note (Signed)
 Transition of Care [facility]) - Progression Note    Patient Details  Name: Jared Grimes MRN: [id number] Date of Birth: 09/11/1965  Transition of Care [facility]) CM/SW Contact  Alexandra Ice, RN Phone Number: 07/13/2023, 11:11 AM  Clinical Narrative:     Patient pending auth for [facility], AuthID: [id number]        Expected Discharge Plan and Services                                               Social Determinants of Health (SDOH) Interventions SDOH Screenings   Food Insecurity: No Food Insecurity (07/04/2023)  Housing: Low Risk  (07/04/2023)  Transportation Needs: No Transportation Needs (07/04/2023)  Utilities: Not At Risk (07/04/2023)  Financial Resource Strain: Low Risk  (01/02/2023)   Received from [facility]  Social Connections: Patient Declined (07/04/2023)  Tobacco Use: Low Risk  (07/04/2023)    Readmission Risk Interventions     No data to display

## 2023-07-14 DIAGNOSIS — S82891A Other fracture of right lower leg, initial encounter for closed fracture: Secondary | ICD-10-CM | POA: Diagnosis not present

## 2023-07-14 LAB — BASIC METABOLIC PANEL WITH GFR
Anion gap: 9 (ref 5–15)
BUN: 20 mg/dL (ref 6–20)
CO2: 24 mmol/L (ref 22–32)
Calcium: 8.5 mg/dL — ABNORMAL LOW (ref 8.9–10.3)
Chloride: 98 mmol/L (ref 98–111)
Creatinine, Ser: 0.72 mg/dL (ref 0.61–1.24)
GFR, Estimated: >60 mL/min (ref >60)
Glucose, Bld: 137 mg/dL — ABNORMAL HIGH (ref 70–99)
Potassium: 4 mmol/L (ref 3.5–5.1)
Sodium: 131 mmol/L — ABNORMAL LOW (ref 135–145)

## 2023-07-14 LAB — CBC
HCT: 37.7 % — ABNORMAL LOW (ref 39.0–52.0)
Hemoglobin: 12.6 g/dL — ABNORMAL LOW (ref 13.0–17.0)
MCH: 31 pg (ref 26.0–34.0)
MCHC: 33.4 g/dL (ref 30.0–36.0)
MCV: 92.9 fL (ref 80.0–100.0)
Platelets: 357 10*3/uL (ref 150–400)
RBC: 4.06 MIL/uL — ABNORMAL LOW (ref 4.22–5.81)
RDW: 12.9 % (ref 11.5–15.5)
WBC: 8.1 10*3/uL (ref 4.0–10.5)
nRBC: 0 % (ref 0.0–0.2)

## 2023-07-14 LAB — MAGNESIUM: Magnesium: 2 mg/dL (ref 1.7–2.4)

## 2023-07-14 LAB — PHOSPHORUS: Phosphorus: 3.2 mg/dL (ref 2.5–4.6)

## 2023-07-14 MED ORDER — TAMSULOSIN HCL 0.4 MG PO CAPS: 0.4000 mg | ORAL_CAPSULE | Freq: Every day | ORAL | Status: AC

## 2023-07-14 MED ORDER — METHOCARBAMOL 500 MG PO TABS: 500.0000 mg | ORAL_TABLET | Freq: Three times a day (TID) | ORAL | Status: AC | PRN

## 2023-07-14 MED ORDER — BISACODYL 10 MG RE SUPP: 10.0000 mg | RECTAL | Status: AC | PRN

## 2023-07-14 MED ORDER — DULCOLAX 5 MG PO TBEC: 10.0000 mg | DELAYED_RELEASE_TABLET | Freq: Every evening | ORAL | Status: AC | PRN

## 2023-07-14 MED ORDER — ACETAMINOPHEN 325 MG PO TABS: 650.0000 mg | ORAL_TABLET | Freq: Four times a day (QID) | ORAL | Status: AC | PRN

## 2023-07-14 MED ORDER — POLYETHYLENE GLYCOL 3350 17 G PO PACK: 17.0000 g | PACK | Freq: Every day | ORAL | Status: AC

## 2023-07-14 MED ORDER — HYDRALAZINE HCL 50 MG PO TABS: 50.0000 mg | ORAL_TABLET | Freq: Three times a day (TID) | ORAL | Status: AC | PRN

## 2023-07-14 MED ORDER — SODIUM CHLORIDE 1 G PO TABS: 2.0000 g | ORAL_TABLET | Freq: Two times a day (BID) | ORAL | Status: AC

## 2023-07-14 NOTE — Discharge Summary (Addendum)
 [facility] Discharge Summary   Patient: Jared Grimes ZOX:[id number]  PCP: Eartha Gold, MD  Date of admission: 07/04/2023   Date of discharge:  07/18/2023     Discharge Diagnoses:  Principal Problem:   Closed right ankle fracture Active Problems:   Fall at home, initial encounter   Seizure disorder [facility])   History of TIA (transient ischemic attack)   Depression   Obesity (BMI 30-39.9)   Admitted From: Home Disposition:  SNF   Recommendations for Outpatient Follow-up:  F/u with PCP, need to be seen by an MD in few days,  F/u with Podiatry in 1 wk Dc foley early am when pt is ambulatory  Follow up LABS/TEST:  repeat BMP in 1 wk   Contact information for follow-up providers     Anell Baptist, DPM Follow up in 1 week(s).   Specialty: Podiatry Why: fu with week for splint change Contact information: [address] [place] [address] [phone]              Contact information for after-discharge care     Destination     [facility] Preferred SNF .   Service: Skilled Nursing Contact information: [address] [place]  [address] [phone]                    Diet recommendation: low fat diet  Activity: The patient is advised to gradually reintroduce usual activities, as tolerated  Discharge Condition: stable  Code Status: Full code   History of present illness: As per the H and P dictated on admission Hospital Course:  Jared Grimes is a 58 y.o. male with medical history significant of TBI, s/p vagus nerve stimulator placement, hypertension, hyperlipidemia, stroke, depression, seizure, who presents with fall, pain in right foot and ankle. He was found to have right ankle fracture admitted to [facility] service with orthopedic surgery evaluation. Patient had ORIF procedure currently awaiting SNF placement. Neurology called for altered mentation, slurred speech.   Assessment and Plan:  #  Closed right ankle fracture, S/p fall unwitnessed s/p Open reduction with internal fixation trimalleolar ankle fracture right ankle, Syndesmotic stabilization and repair right ankle  Continue PRN meds for pain control Tylenol  and Robaxin  prn for muscle spasm.  May start opiates for pain control if he needs. PT/ OT advised SNF placement.  [facility] was following[facility]  Medically stable to discharge today.   As per podiatry: Should remain nonweightbearing. No dressing changes needed. He should follow-up in the office with me in the next 7 to 10 days for dressing removal and likely staple removal. At that time may be able to transition into a boot.    # AKI: Resolved. S/p IV fluid for hydration Foley catheter placed 07/07/23 for urinary retention. Continue Flomax . Remove Foley catheter at the facility and follow-up for voiding trial as per protocol.  May need follow-up with urology as an outpatient if persistent urinary retention.   # Hematuria: Resolved Hematuria noticed on 5/12, possible due to trauma secondary to Foley hematuria resolved since 5/13, H&H stable  # Confusion and dysarthria, resolved  # Seizure disorder (HCC) # History of stroke: Residual weakness on the left side CT head no acute stroke or intracranial bleed. Old infarcts, chronic small vessel disease noted. Did mention to family regarding his pain medications, muscle relaxants possible causing confusion. Previously they were held with out improvement. Neurology evaluation was done, patient is back  to his baseline. Continue Lipitor and Continue home Tegretol  600 mg twice daily  Held Lamictal  300 mg twice daily home dose, On 5/13 Lamictal  level within normal range, resumed Lamictal  at home dose.   # Leukocytosis: WBC 14.0 Likely due to stress. WBC improved.    # Hypertension: BP meds were held initially due to low blood pressure, electron patient became hypertensive so resumed amlodipine  and atenolol .   Discontinued losartan .  Use hydralazine  as needed and may restart losartan  if patient remains hypertensive. Monitor BP and titrate medications accordingly.   # Depression: Continue Cymbalta .   # Isotonic hyponatremia, serum osmolarity 284 WNL 5/15 still low sodium, started fluid striction 1.5 L/day Continued salt tablets for 1 wk and repeat BMP after 1 week   # Obesity class I Body mass index is 33.55 kg/m.  Nutrition Problem: Increased nutrient needs Etiology: post-op healing Nutrition Interventions:  - Patient was instructed, not to drive, operate heavy machinery, perform activities at heights, swimming or participation in water  activities or provide baby sitting services while on Pain, Sleep and Anxiety Medications; until his outpatient Physician has advised to do so again.  - Also recommended to not to take more than prescribed Pain, Sleep and Anxiety Medications.  Patient was seen by physical therapy, who recommended Therapy, SNF placement, which was arranged. On the day of the discharge the patient's vitals were stable, and no other acute medical condition were reported by patient. the patient was felt safe to be discharge at SNF with Therapy.  Consultants: Podiatry Procedures: s/p Open reduction with internal fixation trimalleolar ankle fracture right ankle, Syndesmotic stabilization and repair right ankle   Discharge Exam: General: Appear in no distress, no Rash; Oral Mucosa Clear, moist. Cardiovascular: S1 and S2 Present, no Murmur, Respiratory: normal respiratory effort, Bilateral Air entry present and no Crackles, no wheezes Abdomen: Bowel Sound present, Soft and no tenderness, no hernia Extremities: no Pedal edema, no calf tenderness Neurology: Alert, awake, slow mentation, mild dysarthria, impulsive movements of extremities. Left hemiparesis, residual weakness due to prior stroke. affect appropriate.  Filed Weights   07/15/23 0322 07/16/23 0445 07/17/23 0702  Weight:  93.1 kg 93 kg 100.1 kg   Vitals:   07/18/23 0525 07/18/23 0847  BP: 116/61 111/63  Pulse: 65 (!) 59  Resp: 16 18  Temp: 98 F (36.7 C) 98 F (36.7 C)  SpO2: 99% 97%    DISCHARGE MEDICATION: Allergies as of 07/18/2023   No Known Allergies      Medication List     STOP taking these medications    atorvastatin  40 MG tablet Commonly known as: LIPITOR   cyclobenzaprine 10 MG tablet Commonly known as: FLEXERIL   losartan  100 MG tablet Commonly known as: COZAAR        TAKE these medications    acetaminophen  325 MG tablet Commonly known as: TYLENOL  Take 2 tablets (650 mg total) by mouth every 6 (six) hours as needed for mild pain (pain score 1-3), fever, moderate pain (pain score 4-6) or headache.   amLODipine  5 MG tablet Commonly known as: NORVASC  Take 5 mg by mouth daily.   atenolol  50 MG tablet Commonly known as: TENORMIN  Take 50 mg by mouth daily.   betamethasone dipropionate 0.05 % cream Apply 1 application topically 2 (two) times daily as needed.   Calcium  Carbonate-Vitamin D  600-200 MG-UNIT Tabs Take 1 tablet by mouth 2 (two) times daily.   carbamazepine  200 MG 12 hr tablet Commonly known as: TEGRETOL  XR Take 3 tablets (600  mg total) by mouth 2 (two) times daily. 800 mg every morning and 600 mg at bedtime   cholecalciferol 25 MCG (1000 UNIT) tablet Commonly known as: VITAMIN D3 Take 1 tablet by mouth daily.   [employer] B-12 PO Take 2 tablets by mouth daily.   Dulcolax 5 MG EC tablet Generic drug: bisacodyl  Take 2 tablets (10 mg total) by mouth at bedtime as needed for moderate constipation.   bisacodyl  10 MG suppository Commonly known as: Dulcolax Place 1 suppository (10 mg total) rectally as needed for severe constipation.   DULoxetine  20 MG capsule Commonly known as: CYMBALTA  Take 20 mg by mouth daily.   hydrALAZINE  50 MG tablet Commonly known as: APRESOLINE  Take 1 tablet (50 mg total) by mouth 3 (three) times daily as needed (SBP >150).    lamoTRIgine  100 MG 24 hour tablet Commonly known as: LAMICTAL  XR Take 300 mg by mouth daily.   methocarbamol  500 MG tablet Commonly known as: ROBAXIN  Take 1 tablet (500 mg total) by mouth every 8 (eight) hours as needed for muscle spasms.   polyethylene glycol 17 g packet Commonly known as: MiraLax  Take 17 g by mouth daily.   sodium chloride  1 g tablet Take 2 tablets (2 g total) by mouth 2 (two) times daily with a meal for 7 days.   tamsulosin  0.4 MG Caps capsule Commonly known as: FLOMAX  Take 1 capsule (0.4 mg total) by mouth daily after supper.   traZODone  100 MG tablet Commonly known as: DESYREL  Take 200 mg by mouth at bedtime as needed.        No Known Allergies Discharge Instructions     Call MD for:  difficulty breathing, headache or visual disturbances   Complete by: As directed    Call MD for:  extreme fatigue   Complete by: As directed    Call MD for:  persistant dizziness or light-headedness   Complete by: As directed    Call MD for:  persistant nausea and vomiting   Complete by: As directed    Call MD for:  redness, tenderness, or signs of infection (pain, swelling, redness, odor or green/yellow discharge around incision site)   Complete by: As directed    Call MD for:  severe uncontrolled pain   Complete by: As directed    Call MD for:  temperature >100.4   Complete by: As directed    Diet - low sodium heart healthy   Complete by: As directed    Discharge instructions   Complete by: As directed    F/u with PCP, need to be seen by an MD in few days, repeat BMP in 1 wk F/u with Podiatry in 1 wk   Increase activity slowly   Complete by: As directed        The results of significant diagnostics from this hospitalization (including imaging, microbiology, ancillary and laboratory) are listed below for reference.    Significant Diagnostic Studies: CT HEAD WO CONTRAST ( ) Result Date: 07/07/2023 CLINICAL DATA:  Initial evaluation for acute mental  status change, unknown cause. EXAM: CT HEAD WITHOUT CONTRAST TECHNIQUE: Contiguous axial images were obtained from the base of the skull through the vertex without intravenous contrast. RADIATION DOSE REDUCTION: This exam was performed according to the departmental dose-optimization program which includes automated exposure control, adjustment of the mA and/or kV according to patient size and/or use of iterative reconstruction technique. COMPARISON:  CT from 07/04/2023 FINDINGS: Brain: Frontal lobe predominant cerebral atrophy. Associated chronic microvascular ischemic disease with a  few small remote lacunar infarcts about the deep gray nuclei. Chronic bilateral frontal lobe infarcts noted. Appearance is stable from prior. No acute intracranial hemorrhage. No acute large vessel territory infarct. No mass lesion or midline shift. No hydrocephalus or extra-axial fluid collection. Vascular: No abnormal hyperdense vessel. Calcified atherosclerosis present at the skull base. Skull: Scalp soft tissues within normal limits.  Calvarium intact. Sinuses/Orbits: Globes orbital soft tissues demonstrate no acute finding. Paranasal sinuses and mastoid air cells are largely clear. Other: None. IMPRESSION: 1. No acute intracranial abnormality. 2. Frontal lobe predominant cerebral atrophy with chronic small vessel ischemic disease, with chronic bilateral frontal lobe infarcts. Electronically Signed   By: Virgia Griffins M.D.   On: 07/07/2023 18:25   DG Ankle 2 Views Right Result Date: 07/05/2023 CLINICAL DATA:  Elective surgery. EXAM: RIGHT ANKLE - 2 VIEW COMPARISON:  Preoperative imaging FINDINGS: Six fluoroscopic spot views of the ankle submitted from the operating room. Lateral plate and screw fixation of distal fibular fracture. Plate and screw fixation of the medial malleolus. There is a syndesmotic trapeze. Skin staples are seen medially. Fluoroscopy time 50 seconds. Dose 1.38 mGy. IMPRESSION: Intraoperative fluoroscopy  during ankle fracture fixation. Electronically Signed   By: Chadwick Colonel M.D.   On: 07/05/2023 15:56   DG C-Arm 1-60 Min-No Report Result Date: 07/05/2023 Fluoroscopy was utilized by the requesting physician.  No radiographic interpretation.   DG C-Arm 1-60 Min-No Report Result Date: 07/05/2023 Fluoroscopy was utilized by the requesting physician.  No radiographic interpretation.   CT ANKLE RIGHT WO CONTRAST Result Date: 07/05/2023 CLINICAL DATA:  Ankle fracture EXAM: CT OF THE RIGHT ANKLE WITHOUT CONTRAST TECHNIQUE: Multidetector CT imaging of the right ankle was performed according to the standard protocol. Multiplanar CT image reconstructions were also generated. RADIATION DOSE REDUCTION: This exam was performed according to the departmental dose-optimization program which includes automated exposure control, adjustment of the mA and/or kV according to patient size and/or use of iterative reconstruction technique. COMPARISON:  Radiographs 07/04/2023 FINDINGS: Bones/Joint/Cartilage Unstable stage 4 Weber B fracture of the right ankle observed with transverse medial malleolar component, oblique posterior malleolar component, comminuted oblique distal fibular component with intermediary fragments in the distal fibular fracture. A 1.8 by 0.6 by 0.6 cm bony fragment is present anterior to the distal fibular metaphysis. The posterior malleolar fragment is proximally displaced by 3 mm. The medial malleolar fragment is laterally displaced by 3 mm and demonstrates proximally 3 mm of distraction. No other regional fracture identified. Ligaments Suboptimally assessed by CT. Muscles and Tendons No flexor tendon entrapment.  Regional muscular atrophy in the foot. Soft tissues Subcutaneous edema overlying the malleoli. IMPRESSION: 1. Unstable stage 4 Weber B fracture of the right ankle with transverse medial malleolar component, oblique posterior malleolar component, and comminuted oblique distal fibular component  with intermediary fragments as well as a small fragment anterior to the distal fibular metaphysis. 2. Regional muscular atrophy in the foot. 3. Subcutaneous edema overlying the malleoli. Electronically Signed   By: Freida Jes M.D.   On: 07/05/2023 09:00   DG Tibia/Fibula Right Result Date: 07/04/2023 CLINICAL DATA:  pain EXAM: RIGHT TIBIA AND FIBULA - 2 VIEW COMPARISON:  X-ray right foot 07/04/2023, x-ray right knee 07/04/2023 FINDINGS: Acute displaced and comminuted transsyndesmotic distal fibular fracture. Poorly visualized acute posterior malleolar fracture. Cortical irregularity along the medial malleolus may represent an acute fracture. Ankle subcutaneus soft tissue edema. Redemonstration of possible acute on chronic nondisplaced fibular head neck and proximal shaft fracture. IMPRESSION: 1. Acute  displaced and comminuted transsyndesmotic distal fibular fracture. 2. Poorly visualized acute posterior malleolar fracture. 3. Cortical irregularity along the medial malleolus may represent an acute fracture. 4. Recommend dedicated three view radiograph of the right ankle. 5. Redemonstration of possible acute on chronic nondisplaced fibular head neck and proximal shaft fracture. Correlate with point tenderness to palpation. Electronically Signed   By: Morgane  Naveau M.D.   On: 07/04/2023 17:29   DG Knee Complete 4 Views Right Result Date: 07/04/2023 CLINICAL DATA:  pain Pt fell a few days ago Entire right lower leg is swollen, wound on foot and ankle, bruising Pt was unable to move at all and had to be held for all images EXAM: RIGHT KNEE - COMPLETE 4+ VIEW COMPARISON:  None Available. FINDINGS: Partially visualized intramedullary nail fixation of the distal femur. Cortical irregularity of the fibular head/neck and proximal shaft with query underlying nondisplaced acute fracture. No evidence of acute displaced fracture, dislocation, or joint effusion. No evidence of arthropathy or other focal bone  abnormality. Soft tissues are unremarkable. IMPRESSION: Question acute on chronic nondisplaced proximal fibular shaft fracture. Correlate with point tenderness to palpation to evaluate for an underlying acute component. Electronically Signed   By: Morgane  Naveau M.D.   On: 07/04/2023 17:26   DG Foot Complete Right Result Date: 07/04/2023 CLINICAL DATA:  Pain EXAM: RIGHT FOOT COMPLETE - 3+ VIEW COMPARISON:  None Available. FINDINGS: There is soft tissue swelling of the foot diffusely. There is an acute fracture of the distal fibula, mildly displaced. No additional fractures of the foot identified. IMPRESSION: 1. Acute fracture of the distal fibula, mildly displaced. Recommend dedicated views of the ankle. 2. Soft tissue swelling of the foot diffusely. Electronically Signed   By: Tyron Gallon M.D.   On: 07/04/2023 17:20   CT Cervical Spine Wo Contrast Result Date: 07/04/2023 CLINICAL DATA:  Status post fall 2 days ago. EXAM: CT CERVICAL SPINE WITHOUT CONTRAST TECHNIQUE: Multidetector CT imaging of the cervical spine was performed without intravenous contrast. Multiplanar CT image reconstructions were also generated. RADIATION DOSE REDUCTION: This exam was performed according to the departmental dose-optimization program which includes automated exposure control, adjustment of the mA and/or kV according to patient size and/or use of iterative reconstruction technique. COMPARISON:  September 07, 2020 FINDINGS: Alignment: There is straightening of the normal cervical spine lordosis. Skull base and vertebrae: No acute fracture. No primary bone lesion or focal pathologic process. Soft tissues and spinal canal: No prevertebral fluid or swelling. No visible canal hematoma. Disc levels: Marked severity endplate sclerosis, anterior osteophyte formation and posterior bony spurring are seen at the levels of C4-C5 and C5-C6. Moderate to marked severity intervertebral disc space narrowing is seen at the level of C5-C6, with  moderate severity intervertebral disc space narrowing seen throughout the remainder of the cervical spine. Bilateral marked severity multilevel facet joint hypertrophy is noted, left greater than right. Upper chest: Negative. Other: None. IMPRESSION: Marked severity multilevel degenerative changes, as described above, without evidence of an acute fracture or subluxation. Electronically Signed   By: Virgle Grime M.D.   On: 07/04/2023 16:12   CT Head Wo Contrast Result Date: 07/04/2023 CLINICAL DATA:  Status post fall 2 days ago. EXAM: CT HEAD WITHOUT CONTRAST TECHNIQUE: Contiguous axial images were obtained from the base of the skull through the vertex without intravenous contrast. RADIATION DOSE REDUCTION: This exam was performed according to the departmental dose-optimization program which includes automated exposure control, adjustment of the mA and/or kV according to patient  size and/or use of iterative reconstruction technique. COMPARISON:  September 07, 2020 FINDINGS: Brain: There is generalized cerebral atrophy with widening of the extra-axial spaces and ventricular dilatation. There are areas of decreased attenuation within the white matter tracts of the supratentorial brain, consistent with microvascular disease changes. Small, stable areas of bilateral frontal lobe encephalomalacia are seen. Vascular: Marked severity bilateral cavernous carotid artery calcification is noted. Skull: Normal. Negative for fracture or focal lesion. Sinuses/Orbits: No acute finding. Other: None. IMPRESSION: 1. Generalized cerebral atrophy and microvascular disease changes of the supratentorial brain. 2. Small, stable areas of bilateral frontal lobe encephalomalacia. 3. No acute intracranial abnormality. Electronically Signed   By: Virgle Grime M.D.   On: 07/04/2023 16:10   DG Ankle Complete Right Result Date: 07/04/2023 CLINICAL DATA:  injury.  Unwitnessed fall. EXAM: RIGHT ANKLE - COMPLETE 3+ VIEW COMPARISON:  None  Available. FINDINGS: There is comminuted mildly displaced fracture of the lower metadiaphysis of the right fibula. There is also undisplaced/minimally displaced fracture of the medial malleolus. No other acute fracture or dislocation. No aggressive osseous lesion. Ankle mortise appears intact. Calcaneal spur noted along the Achilles tendon attachment site. No focal soft tissue swelling. No radiopaque foreign bodies. IMPRESSION: *Bimalleolar fracture, as described above. Electronically Signed   By: Beula Brunswick M.D.   On: 07/04/2023 15:47    Microbiology: No results found for this or any previous visit (from the past 240 hours).    Labs: CBC: Recent Labs  Lab 07/12/23 0311 07/14/23 1013  WBC 9.1 8.1  HGB 12.5* 12.6*  HCT 36.5* 37.7*  MCV 89.9 92.9  PLT 351 357   Basic Metabolic Panel: Recent Labs  Lab 07/12/23 0311 07/14/23 1013  NA 129* 131*  K 4.0 4.0  CL 96* 98  CO2 25 24  GLUCOSE 95 137*  BUN 22* 20  CREATININE 0.63 0.72  CALCIUM  8.4* 8.5*  MG  --  2.0  PHOS  --  3.2   Liver Function Tests: No results for input(s): "AST", "ALT", "ALKPHOS", "BILITOT", "PROT", "ALBUMIN" in the last 168 hours. No results for input(s): "LIPASE", "AMYLASE" in the last 168 hours. No results for input(s): "AMMONIA" in the last 168 hours. Cardiac Enzymes: No results for input(s): "CKTOTAL", "CKMB", "CKMBINDEX", "TROPONINI" in the last 168 hours. BNP (last 3 results) No results for input(s): "BNP" in the last 8760 hours. CBG: No results for input(s): "GLUCAP" in the last 168 hours.  Time spent: 35 minutes  Signed:  Althia Atlas  [facility] 07/18/2023 12:17 PM

## 2023-07-14 NOTE — Progress Notes (Signed)
 Progress Note   Patient: Jared Grimes ZOX:[id number] DOB: March 30, 1965 DOA: 07/04/2023     10 DOS: the patient was seen and examined on 07/14/2023   Brief hospital course: Jared Grimes is a 58 y.o. male with medical history significant of TBI, s/p vagus nerve stimulator placement, hypertension, hyperlipidemia, stroke, depression, seizure, who presents with fall, pain in right foot and ankle. He was found to have right ankle fracture admitted to [facility] service with orthopedic surgery evaluation. Patient had ORIF procedure currently awaiting SNF placement. Neurology called for altered mentation, slurred speech.  Assessment and Plan: S/p fall unwitnessed Closed right ankle fracture:  s/p Open reduction with internal fixation trimalleolar ankle fracture right ankle, Syndesmotic stabilization and repair right ankle  Continue pain control: prn morphine , percocet and tylenol . Continue Zofran  for nausea. Continue Robaxin , Lidoderm  patch for pain control. Family notified about pain meds being cause of confusion. Previously they were held with out improvement. PT/ OT advised SNF placement. TOC working on placement.   AKI Resolved. S/p IV fluid for hydration Foley catheter placed 07/07/23 for urinary retention. Continue Flomax .  Hematuria noticed on 5/12, possible due to trauma secondary to Foley 5/13 hematuria resolved H&H stable Continue to monitor  Confusion and dysarthria, resolved  Seizure disorder (HCC) History of stroke:  CT head no acute stroke or intracranial bleed. Old infarcts, chronic small vessel disease noted. Did mention to family regarding his pain medications, muscle relaxants possible causing confusion. Previously they were held with out improvement. Neurology evaluation for further work up. Continue seizure precaution As needed Ativan for seizure. Continue Lipitor. Continue home Tegretol  600 mg twice daily  Held Lamictal  300 mg twice daily home dose,  5/13 Lamictal  level  within normal range, resumed Lamictal  at home dose.   Leukocytosis: WBC 14.0 Likely due to stress. WBC improved.    Hypertension:  Amlodipine , atenolol , resumed as BP improved. Continue IV hydralazine  as needed. Monitor BP and titrate medications accordingly   Depression: Continue Cymbalta .  Isotonic hyponatremia, serum osmolarity 284 WNL Continue fluid restriction 1.5 L/day 5/16, continue sodium chloride  tablets Sodium 131 today improving Monitor sodium level daily  Obesity class I BMI 31.07 kg/m.  Encourage to lose some weight Exercise and healthy diet     Assist with feeding. Out of bed to chair. Incentive spirometry. Nursing supportive care. Fall, aspiration precautions. Diet:  Diet Orders (From admission, onward)     Start     Ordered   07/13/23 1034  Diet regular Room service appropriate? Yes; Fluid consistency: Thin; Fluid restriction: 1500 mL Fluid  Diet effective now       Question Answer Comment  Room service appropriate? Yes   Fluid consistency: Thin   Fluid restriction: 1500 mL Fluid      07/13/23 1033           DVT prophylaxis: enoxaparin  (LOVENOX ) injection 40 mg Start: 07/06/23 1800 SCDs Start: 07/04/23 1916  Level of care: Med-Surg   Code Status: Full Code  Subjective: No significant events overnight, patient was laying in the bed comfortably, pain is under control, no discomfort. Patient is awaiting for placement    Physical Exam: Vitals:   07/13/23 2016 07/14/23 0449 07/14/23 0500 07/14/23 0812  BP: (!) 149/89 139/79  135/79  Pulse: 67 71  68  Resp: 20 16  16   Temp: 98 F (36.7 C) 98 F (36.7 C)  98.1 F (36.7 C)  TempSrc:    Oral  SpO2: 97% 95%  98%  Weight:  93.5 kg   Height:        General - Middle aged Caucasian male, no apparent distress HEENT - PERRLA, EOMI, atraumatic head, non tender sinuses. Lung - Clear, basal rales, no rhonchi, wheezes. Heart - S1, S2 heard, no murmurs, rubs, trace pedal edema. Abdomen - Soft,  non tender, obese, foley intact. Neuro - alert, awake, slow mentation, mild dysarthria, impulsive movements of extremities.  Left hemiparesis, residual weakness due to prior stroke Skin - Warm and dry. Right leg cast and dressing intact  Data Reviewed:      Latest Ref Rng & Units 07/14/2023   10:13 AM 07/12/2023    3:11 AM 07/11/2023   10:48 AM  CBC  WBC 4.0 - 10.5 K/uL 8.1  9.1  9.3   Hemoglobin 13.0 - 17.0 g/dL 16.1  09.6  04.5   Hematocrit 39.0 - 52.0 % 37.7  36.5  39.3   Platelets 150 - 400 K/uL 357  351  336       Latest Ref Rng & Units 07/14/2023   10:13 AM 07/12/2023    3:11 AM 07/11/2023   10:48 AM  BMP  Glucose 70 - 99 mg/dL 409  95  811   BUN 6 - 20 mg/dL 20  22  17    Creatinine 0.61 - 1.24 mg/dL 9.14  7.82  9.56   Sodium 135 - 145 mmol/L 131  129  129   Potassium 3.5 - 5.1 mmol/L 4.0  4.0  4.2   Chloride 98 - 111 mmol/L 98  96  98   CO2 22 - 32 mmol/L 24  25  24    Calcium  8.9 - 10.3 mg/dL 8.5  8.4  8.3    No results found.  Family Discussion- Discussed with sister, son over phone. Sister upset about his care. He is disabled and need more attention and family wishes to go to a facility where he can be taken care.   Disposition: Status is: Inpatient Remains inpatient appropriate because: Neurology evaluation, SNF placement.  Planned Discharge Destination: Skilled nursing facility  Clinically stable, medically optimized to discharge. DC to SNF when bed will be available. 5/16 as per TOC no beds today, patient will go tomorrow to [facility]     Time spent: 40 minutes  Author: Althia Atlas, MD 07/14/2023 2:25 PM Secure chat 7am to 7pm For on call review [email or web address].

## 2023-07-15 DIAGNOSIS — S82891D Other fracture of right lower leg, subsequent encounter for closed fracture with routine healing: Secondary | ICD-10-CM

## 2023-07-15 NOTE — Plan of Care (Signed)

## 2023-07-15 NOTE — TOC Progression Note (Signed)
 Transition of Care [facility]) - Progression Note    Patient Details  Name: Jared Grimes MRN: [id number] Date of Birth: 1965-04-09  Transition of Care [facility]) CM/SW Contact  Crayton Docker, RN 07/15/2023, 4:01 PM  Clinical Narrative:     Medical record review, discharge summary, discharge orders noted to [facility]. CM follow up in [place], [place] approved approved 07/13/2023, next review 07/17/2023, Plan Auth ID [id number], Auth ID [id number]. CM call to Admissions, [facility] [place]. CM spoke to [place]. Per Tiffany, no one available in Admissions. CM call to Gena, Admissions, [facility], phone: [phone] regarding SNF admission today, no answer, CM left message for return call.   Expected Discharge Plan and Services    SNF.     Expected Discharge Date: 07/15/23                   Social Determinants of Health (SDOH) Interventions SDOH Screenings   Food Insecurity: No Food Insecurity (07/04/2023)  Housing: Low Risk  (07/04/2023)  Transportation Needs: No Transportation Needs (07/04/2023)  Utilities: Not At Risk (07/04/2023)  Financial Resource Strain: Low Risk  (01/02/2023)   Received from [facility]  Social Connections: Patient Declined (07/04/2023)  Tobacco Use: Low Risk  (07/04/2023)    Readmission Risk Interventions     No data to display

## 2023-07-16 DIAGNOSIS — S82891D Other fracture of right lower leg, subsequent encounter for closed fracture with routine healing: Secondary | ICD-10-CM | POA: Diagnosis not present

## 2023-07-16 MED ORDER — ORAL CARE MOUTH RINSE: 15.0000 mL | OROMUCOSAL | Status: DC | PRN

## 2023-07-16 NOTE — TOC Progression Note (Signed)
 Transition of Care [facility]) - Progression Note    Patient Details  Name: Jared Grimes MRN: [id number] Date of Birth: 1965/03/20  Transition of Care [facility]) CM/SW Contact  Crayton Docker, RN 07/16/2023, 1:20 PM  Clinical Narrative:     CM call to [facility], phone: [phone] regarding Auth Plan ID; [id number] and validity dates 07/13/2023-07/17/2023. Patient selected to attend short term rehab at [facility] on Monday, 07/17/2023. CM spoke to [place]. Per Odilia Bennett, patient's authorization is valid until 2359 on 07/17/2023 and there is a 24 hour grace period after 07/17/2023 at 2359.   CM call to [facility] regarding BLS transport scheduling for 07/17/2023. CM spoke to Ed. Per ED, BLS transport scheduled for 07/17/2023 at 1300.  CM follow up in [place] MUST regarding PASSR. PASSR still under nurse analyst review as of 07/14/2023 at 1249. CM will follow up.  Expected Discharge Plan and Services    SNF/[facility] [place]     Expected Discharge Date: 07/15/23                   Social Determinants of Health (SDOH) Interventions SDOH Screenings   Food Insecurity: No Food Insecurity (07/04/2023)  Housing: Low Risk  (07/04/2023)  Transportation Needs: No Transportation Needs (07/04/2023)  Utilities: Not At Risk (07/04/2023)  Financial Resource Strain: Low Risk  (01/02/2023)   Received from [facility]  Social Connections: Patient Declined (07/04/2023)  Tobacco Use: Low Risk  (07/04/2023)    Readmission Risk Interventions     No data to display

## 2023-07-16 NOTE — Progress Notes (Signed)
 Progress Note   Patient: Jared Grimes ZOX:[id number] DOB: [birth year] DOA: 07/04/2023     12 DOS: the patient was seen and examined on 07/16/2023   Brief hospital course: Jared Grimes is a 58 y.o. male with medical history significant of TBI, s/p vagus nerve stimulator placement, hypertension, hyperlipidemia, stroke, depression, seizure, who presents with fall, pain in right foot and ankle. He was found to have right ankle fracture admitted to [facility] service with orthopedic surgery evaluation. Patient had ORIF procedure currently awaiting SNF placement. Neurology called for altered mentation, slurred speech.  Patient is medically stable for discharge, SNF will not be able to accept patient until 5/19.   Assessment and Plan: S/p fall unwitnessed Closed right ankle fracture:  s/p Open reduction with internal fixation trimalleolar ankle fracture right ankle, Syndesmotic stabilization and repair right ankle  Continue pain control: prn morphine , percocet and tylenol . Continue Zofran  for nausea. Continue Robaxin , Lidoderm  patch for pain control. Family notified about pain meds being cause of confusion. Previously they were held with out improvement. PT/ OT advised SNF placement. TOC working on placement.   AKI Resolved. S/p IV fluid for hydration Foley catheter placed 07/07/23 for urinary retention. Continue Flomax .  Hematuria noticed on 5/12, possible due to trauma secondary to Foley 5/13 hematuria resolved H&H stable Continue to monitor  Confusion and dysarthria, resolved  Seizure disorder (HCC) History of stroke:  CT head no acute stroke or intracranial bleed. Old infarcts, chronic small vessel disease noted. Did mention to family regarding his pain medications, muscle relaxants possible causing confusion. Previously they were held with out improvement. Neurology evaluation for further work up. Continue seizure precaution As needed Ativan for seizure. Continue Lipitor. Continue home  Tegretol  600 mg twice daily  Held Lamictal  300 mg twice daily home dose,  5/13 Lamictal  level within normal range, resumed Lamictal  at home dose.   Leukocytosis: resolved  WBC improved.    Hypertension:  Amlodipine , atenolol , resumed as BP improved. Continue IV hydralazine  as needed. Monitor BP and titrate medications accordingly   Depression: Continue Cymbalta .  Mild normocytic anemia   Euvolemic hyponatremia, serum osmolarity 284 WNL Continue fluid restriction 1.5 L/day 5/16, continue sodium chloride  tablets Sodium 131 today improving Monitor sodium level daily  Obesity class I BMI 31.07 kg/m.  Encourage to lose some weight Exercise and healthy diet     Assist with feeding. Out of bed to chair. Incentive spirometry. Nursing supportive care. Fall, aspiration precautions. Diet:  Diet Orders (From admission, onward)     Start     Ordered   07/13/23 1034  Diet regular Room service appropriate? Yes; Fluid consistency: Thin; Fluid restriction: 1500 mL Fluid  Diet effective now       Question Answer Comment  Room service appropriate? Yes   Fluid consistency: Thin   Fluid restriction: 1500 mL Fluid      07/13/23 1033           DVT prophylaxis: enoxaparin  (LOVENOX ) injection 40 mg Start: 07/06/23 1800 SCDs Start: 07/04/23 1916  Level of care: Med-Surg   Code Status: Full Code  Subjective: Significant events overnight.  Patient was supposed to transfer to SNF unfortunately facility was not able to accept patient yesterday.  No acute complaints this a.m., patient is eager to leave the hospital.    Physical Exam: Vitals:   07/16/23 0402 07/16/23 0445 07/16/23 0804 07/16/23 1533  BP: (!) 151/89  (!) 161/91 (!) 157/99  Pulse: 65  66 66  Resp: 17  16  16  Temp: 98 F (36.7 C)  97.8 F (36.6 C) 97.8 F (36.6 C)  TempSrc:    Oral  SpO2: 97%  97% 98%  Weight:  93 kg    Height:        General - Middle aged Caucasian male, no apparent distress HEENT -  PERRLA, EOMI, atraumatic head, non tender sinuses. Lung - Clear, basal rales, no rhonchi, wheezes. Heart - S1, S2 heard, no murmurs, rubs, trace pedal edema. Abdomen - Soft, non tender, obese, foley intact. Neuro - alert, awake, slow mentation, mild dysarthria, impulsive movements of extremities.  Left hemiparesis, residual weakness due to prior stroke Skin - Warm and dry. Right leg cast and dressing intact  Data Reviewed:      Latest Ref Rng & Units 07/14/2023   10:13 AM 07/12/2023    3:11 AM 07/11/2023   10:48 AM  CBC  WBC 4.0 - 10.5 K/uL 8.1  9.1  9.3   Hemoglobin 13.0 - 17.0 g/dL 16.1  09.6  04.5   Hematocrit 39.0 - 52.0 % 37.7  36.5  39.3   Platelets 150 - 400 K/uL 357  351  336       Latest Ref Rng & Units 07/14/2023   10:13 AM 07/12/2023    3:11 AM 07/11/2023   10:48 AM  BMP  Glucose 70 - 99 mg/dL 409  95  811   BUN 6 - 20 mg/dL 20  22  17    Creatinine 0.61 - 1.24 mg/dL 9.14  7.82  9.56   Sodium 135 - 145 mmol/L 131  129  129   Potassium 3.5 - 5.1 mmol/L 4.0  4.0  4.2   Chloride 98 - 111 mmol/L 98  96  98   CO2 22 - 32 mmol/L 24  25  24    Calcium  8.9 - 10.3 mg/dL 8.5  8.4  8.3    No results found.  Family Discussion- Discussed with patient at length and he noted understanding.  Disposition: Clinically stable, medically optimized to discharge. DC to SNF in the a.m. per [facility]     Time spent: 40 minutes  Author: Joette Mustard, MD 07/16/2023 4:51 PM Secure chat 7am to 7pm For on call review [email or web address].

## 2023-07-16 NOTE — Plan of Care (Signed)
  Problem: Clinical Measurements: Goal: Ability to maintain clinical measurements within normal limits will improve Outcome: Progressing   Problem: Clinical Measurements: Goal: Diagnostic test results will improve Outcome: Progressing   Problem: Activity: Goal: Risk for activity intolerance will decrease Outcome: Progressing   Problem: Safety: Goal: Ability to remain free from injury will improve Outcome: Progressing

## 2023-07-16 NOTE — Plan of Care (Signed)

## 2023-07-17 DIAGNOSIS — S82891D Other fracture of right lower leg, subsequent encounter for closed fracture with routine healing: Secondary | ICD-10-CM | POA: Diagnosis not present

## 2023-07-17 NOTE — TOC Progression Note (Signed)
 Transition of Care [facility]) - Progression Note    Patient Details  Name: Jared Grimes MRN: [id number] Date of Birth: February 04, 1966  Transition of Care [facility]) CM/SW Contact  Alexandra Ice, RN Phone Number: 07/17/2023, 1:59 PM  Clinical Narrative:    TOC checked NCMUST, requesting signed FL2 and asking if patient had Neurology consult. TOC sent over signed FL2 and let them know patient did not have a Neurology consult, patient is still pending PASRR        Expected Discharge Plan and Services         Expected Discharge Date: 07/17/23                                     Social Determinants of Health (SDOH) Interventions SDOH Screenings   Food Insecurity: No Food Insecurity (07/04/2023)  Housing: Low Risk  (07/04/2023)  Transportation Needs: No Transportation Needs (07/04/2023)  Utilities: Not At Risk (07/04/2023)  Financial Resource Strain: Low Risk  (01/02/2023)   Received from [facility]  Social Connections: Patient Declined (07/04/2023)  Tobacco Use: Low Risk  (07/04/2023)    Readmission Risk Interventions     No data to display

## 2023-07-17 NOTE — TOC Progression Note (Signed)
 Transition of Care [facility]) - Progression Note    Patient Details  Name: Jared Grimes MRN: [id number] Date of Birth: 1965/11/03  Transition of Care [facility]) CM/SW Contact  Alexandra Ice, RN Phone Number: 07/17/2023, 12:36 PM  Clinical Narrative:    TOC checked NCMUST, patient is sending pending PASRR. Contacted [facility] spoke with Clayborne Cunning, rescheduled pick up time to 5pm.         Expected Discharge Plan and Services         Expected Discharge Date: 07/17/23                                     Social Determinants of Health (SDOH) Interventions SDOH Screenings   Food Insecurity: No Food Insecurity (07/04/2023)  Housing: Low Risk  (07/04/2023)  Transportation Needs: No Transportation Needs (07/04/2023)  Utilities: Not At Risk (07/04/2023)  Financial Resource Strain: Low Risk  (01/02/2023)   Received from [facility]  Social Connections: Patient Declined (07/04/2023)  Tobacco Use: Low Risk  (07/04/2023)    Readmission Risk Interventions     No data to display

## 2023-07-17 NOTE — Plan of Care (Signed)

## 2023-07-17 NOTE — Progress Notes (Signed)
 Progress Note   Patient: Jared Grimes:[id number] DOB: [birth year] DOA: 07/04/2023     13 DOS: the patient was seen and examined on 07/15/2023   Brief hospital course: Jared Grimes is a 58 y.o. male with medical history significant of TBI, s/p vagus nerve stimulator placement, hypertension, hyperlipidemia, stroke, depression, seizure, who presents with fall, pain in right foot and ankle. He was found to have right ankle fracture admitted to [facility] service with orthopedic surgery evaluation. Patient had ORIF procedure currently awaiting SNF placement. Neurology called for altered mentation, slurred speech.  Assessment and Plan: S/p fall unwitnessed Closed right ankle fracture:  s/p Open reduction with internal fixation trimalleolar ankle fracture right ankle, Syndesmotic stabilization and repair right ankle  Continue pain control: prn morphine , percocet and tylenol . Continue Zofran  for nausea. Continue Robaxin , Lidoderm  patch for pain control. Family notified about pain meds being cause of confusion. Previously they were held with out improvement. PT/ OT advised SNF placement. TOC working on placement.   AKI Resolved. S/p IV fluid for hydration Foley catheter placed 07/07/23 for urinary retention. Continue Flomax .  Hematuria noticed on 5/12, possible due to trauma secondary to Foley 5/13 hematuria resolved H&H stable Continue to monitor  Confusion and dysarthria, resolved  Seizure disorder (HCC) History of stroke:  CT head no acute stroke or intracranial bleed. Old infarcts, chronic small vessel disease noted. Did mention to family regarding his pain medications, muscle relaxants possible causing confusion. Previously they were held with out improvement. Neurology evaluation for further work up. Continue seizure precaution As needed Ativan for seizure. Continue Lipitor. Continue home Tegretol  600 mg twice daily  Held Lamictal  300 mg twice daily home dose,  5/13 Lamictal  level  within normal range, resumed Lamictal  at home dose.   Leukocytosis: WBC 14.0 Likely due to stress. WBC improved.    Hypertension:  Amlodipine , atenolol , resumed as BP improved. Continue IV hydralazine  as needed. Monitor BP and titrate medications accordingly   Depression: Continue Cymbalta .  Isotonic hyponatremia, serum osmolarity 284 WNL Continue fluid restriction 1.5 L/day 5/16, continue sodium chloride  tablets Sodium 131 today improving Monitor sodium level daily  Obesity class I BMI 31.07 kg/m.  Encourage to lose some weight Exercise and healthy diet     Assist with feeding. Out of bed to chair. Incentive spirometry. Nursing supportive care. Fall, aspiration precautions. Diet:  Diet Orders (From admission, onward)     Start     Ordered   07/13/23 1034  Diet regular Room service appropriate? Yes; Fluid consistency: Thin; Fluid restriction: 1500 mL Fluid  Diet effective now       Question Answer Comment  Room service appropriate? Yes   Fluid consistency: Thin   Fluid restriction: 1500 mL Fluid      07/13/23 1033           DVT prophylaxis: enoxaparin  (LOVENOX ) injection 40 mg Start: 07/06/23 1800 SCDs Start: 07/04/23 1916  Level of care: Med-Surg   Code Status: Full Code  Subjective: No significant events overnight, patient was laying in the bed comfortably, pain is under control, no discomfort. Patient is awaiting for placement    Physical Exam: Vitals:   07/16/23 2015 07/17/23 0417 07/17/23 0702 07/17/23 0839  BP: 125/80 121/63  (!) 145/81  Pulse: 66 61  66  Resp: 18 16  16   Temp: 98.2 F (36.8 C) 98.1 F (36.7 C)  97.7 F (36.5 C)  TempSrc: Oral   Oral  SpO2: 99% 99%  99%  Weight:  100.1 kg   Height:        General - Middle aged Caucasian male, no apparent distress HEENT - PERRLA, EOMI, atraumatic head, non tender sinuses. Lung - Clear, basal rales, no rhonchi, wheezes. Heart - S1, S2 heard, no murmurs, rubs, trace pedal  edema. Abdomen - Soft, non tender, obese, foley intact. Neuro - alert, awake, slow mentation, mild dysarthria, impulsive movements of extremities.  Left hemiparesis, residual weakness due to prior stroke Skin - Warm and dry. Right leg cast and dressing intact  Data Reviewed:      Latest Ref Rng & Units 07/14/2023   10:13 AM 07/12/2023    3:11 AM 07/11/2023   10:48 AM  CBC  WBC 4.0 - 10.5 K/uL 8.1  9.1  9.3   Hemoglobin 13.0 - 17.0 g/dL 91.4  78.2  95.6   Hematocrit 39.0 - 52.0 % 37.7  36.5  39.3   Platelets 150 - 400 K/uL 357  351  336       Latest Ref Rng & Units 07/14/2023   10:13 AM 07/12/2023    3:11 AM 07/11/2023   10:48 AM  BMP  Glucose 70 - 99 mg/dL 213  95  086   BUN 6 - 20 mg/dL 20  22  17    Creatinine 0.61 - 1.24 mg/dL 5.78  4.69  6.29   Sodium 135 - 145 mmol/L 131  129  129   Potassium 3.5 - 5.1 mmol/L 4.0  4.0  4.2   Chloride 98 - 111 mmol/L 98  96  98   CO2 22 - 32 mmol/L 24  25  24    Calcium  8.9 - 10.3 mg/dL 8.5  8.4  8.3    No results found.  Family Discussion- Discussed with sister, son over phone. Sister upset about his care. He is disabled and need more attention and family wishes to go to a facility where he can be taken care.   Disposition: Status is: Inpatient Remains inpatient appropriate because: Neurology evaluation, SNF placement.  Planned Discharge Destination: Skilled nursing facility  Clinically stable, medically optimized to discharge. DC to SNF when bed will be available. 5/17 as per TOC no beds today, patient will go on Monday to SNF     Time spent: 40 minutes  Author: Althia Atlas, MD 07/17/2023 10:30 AM Secure chat 7am to 7pm For on call review [email or web address].

## 2023-07-17 NOTE — TOC Progression Note (Signed)
 Transition of Care [facility]) - Progression Note    Patient Details  Name: Jared Grimes MRN: [id number] Date of Birth: 1965/04/22  Transition of Care [facility]) CM/SW Contact  Alexandra Ice, RN Phone Number: 07/17/2023, 4:09 PM  Clinical Narrative:     Maryagnes Small, PASRR still pending review. Contacted [facility] spoke with Blaise Bumps, rescheduled transport for tomorrow at 12p. Notified MD and bedside nurse.        Expected Discharge Plan and Services         Expected Discharge Date: 07/17/23                                     Social Determinants of Health (SDOH) Interventions SDOH Screenings   Food Insecurity: No Food Insecurity (07/04/2023)  Housing: Low Risk  (07/04/2023)  Transportation Needs: No Transportation Needs (07/04/2023)  Utilities: Not At Risk (07/04/2023)  Financial Resource Strain: Low Risk  (01/02/2023)   Received from [facility]  Social Connections: Patient Declined (07/04/2023)  Tobacco Use: Low Risk  (07/04/2023)    Readmission Risk Interventions     No data to display

## 2023-07-17 NOTE — Plan of Care (Signed)

## 2023-07-18 DIAGNOSIS — S82891D Other fracture of right lower leg, subsequent encounter for closed fracture with routine healing: Secondary | ICD-10-CM | POA: Diagnosis not present

## 2023-07-18 NOTE — TOC Transition Note (Signed)
 Transition of Care [facility]) - Discharge Note   Patient Details  Name: Jared Grimes MRN: [id number] Date of Birth: Mar 22, 1965  Transition of Care [facility]) CM/SW Contact:  Alexandra Ice, RN Phone Number: 07/18/2023, 1:38 PM   Clinical Narrative:    Patient received PASRR, [facility] able to take patient today. Discharge summary and orders sent via HUB. Patient going to room 801, bedside nurse to call report [phone]. Spoke with Blaise Bumps at [facility], patient scheduled for pick up at 5:30pm. EMS packet printed to nurse station. Attempted to contact [place], to update him on discharge, no answer, voicemail full. Attempted to contact Tammy, no answer, left message on voicemail updated her on discharge. Updated MD and bedside nurse.    Final next level of care: Skilled Nursing Facility Barriers to Discharge: Barriers Resolved   Patient Goals and CMS Choice Patient states their goals for this hospitalization and ongoing recovery are:: get better CMS Medicare.gov Compare Post Acute Care list provided to:: Patient Represenative (must comment) (Hall, son) Choice offered to / list presented to : Adult Children      Discharge Placement              Patient chooses bed at: [facility] [place] Patient to be transferred to facility by: [facility] Name of family member notified: [place] and Tammy Patient and family notified of of transfer: 07/18/23  Discharge Plan and Services Additional resources added to the After Visit Summary for                    DME Agency: NA       HH Arranged: NA          Social Drivers of Health (SDOH) Interventions SDOH Screenings   Food Insecurity: No Food Insecurity (07/04/2023)  Housing: Low Risk  (07/04/2023)  Transportation Needs: No Transportation Needs (07/04/2023)  Utilities: Not At Risk (07/04/2023)  Financial Resource Strain: Low Risk  (01/02/2023)   Received from [facility]  Social Connections: Patient Declined  (07/04/2023)  Tobacco Use: Low Risk  (07/04/2023)     Readmission Risk Interventions     No data to display

## 2023-07-18 NOTE — Progress Notes (Signed)
 Report called over to [facility].
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
## 2023-07-18 NOTE — Plan of Care (Signed)

## 2023-07-18 NOTE — TOC Progression Note (Signed)
 Transition of Care [facility]) - Progression Note    Patient Details  Name: Jared Grimes MRN: [id number] Date of Birth: [birth year]  Transition [facility]) CM/SW Contact  Alexandra Ice, RN Phone Number: 07/18/2023, 7:36 AM  Clinical Narrative:     TOC checked NCMUST, patient has PASRR number, [id number] E. Sent message to Tammy at [facility] to see if they are able to accept patient today. Awaiting response.        Expected Discharge Plan and Services         Expected Discharge Date: 07/17/23                                     Social Determinants of Health (SDOH) Interventions SDOH Screenings   Food Insecurity: No Food Insecurity (07/04/2023)  Housing: Low Risk  (07/04/2023)  Transportation Needs: No Transportation Needs (07/04/2023)  Utilities: Not At Risk (07/04/2023)  Financial Resource Strain: Low Risk  (01/02/2023)   Received from [facility]  Social Connections: Patient Declined (07/04/2023)  Tobacco Use: Low Risk  (07/04/2023)    Readmission Risk Interventions     No data to display

## 2023-07-18 NOTE — Progress Notes (Signed)
 Progress Note   Patient: Jared Grimes ZOX:[id number] DOB: [birth year] DOA: 07/04/2023     14 DOS: the patient was seen and examined on 07/15/2023   Brief hospital course: Jared Grimes is a 58 y.o. male with medical history significant of TBI, s/p vagus nerve stimulator placement, hypertension, hyperlipidemia, stroke, depression, seizure, who presents with fall, pain in right foot and ankle. He was found to have right ankle fracture admitted to [facility] service with orthopedic surgery evaluation. Patient had ORIF procedure currently awaiting SNF placement. Neurology called for altered mentation, slurred speech.  Assessment and Plan: S/p fall unwitnessed Closed right ankle fracture:  s/p Open reduction with internal fixation trimalleolar ankle fracture right ankle, Syndesmotic stabilization and repair right ankle  Continue pain control: prn morphine , percocet and tylenol . Continue Zofran  for nausea. Continue Robaxin , Lidoderm  patch for pain control. Family notified about pain meds being cause of confusion. Previously they were held with out improvement. PT/ OT advised SNF placement. TOC working on placement.   AKI Resolved. S/p IV fluid for hydration Foley catheter placed 07/07/23 for urinary retention. Continue Flomax .  Hematuria noticed on 5/12, possible due to trauma secondary to Foley 5/13 hematuria resolved H&H stable Continue to monitor  Confusion and dysarthria, resolved  Seizure disorder (HCC) History of stroke:  CT head no acute stroke or intracranial bleed. Old infarcts, chronic small vessel disease noted. Did mention to family regarding his pain medications, muscle relaxants possible causing confusion. Previously they were held with out improvement. Neurology evaluation for further work up. Continue seizure precaution As needed Ativan for seizure. Continue Lipitor. Continue home Tegretol  600 mg twice daily  Held Lamictal  300 mg twice daily home dose,  5/13 Lamictal  level  within normal range, resumed Lamictal  at home dose.   Leukocytosis: WBC 14.0 Likely due to stress. WBC improved.    Hypertension:  Amlodipine , atenolol , resumed as BP improved. Continue IV hydralazine  as needed. Monitor BP and titrate medications accordingly   Depression: Continue Cymbalta .  Isotonic hyponatremia, serum osmolarity 284 WNL Continue fluid restriction 1.5 L/day 5/16, continue sodium chloride  tablets Sodium 131 today improving Monitor sodium level daily  Obesity class I BMI 31.07 kg/m.  Encourage to lose some weight Exercise and healthy diet     Assist with feeding. Out of bed to chair. Incentive spirometry. Nursing supportive care. Fall, aspiration precautions. Diet:  Diet Orders (From admission, onward)     Start     Ordered   07/13/23 1034  Diet regular Room service appropriate? Yes; Fluid consistency: Thin; Fluid restriction: 1500 mL Fluid  Diet effective now       Question Answer Comment  Room service appropriate? Yes   Fluid consistency: Thin   Fluid restriction: 1500 mL Fluid      07/13/23 1033           DVT prophylaxis: enoxaparin  (LOVENOX ) injection 40 mg Start: 07/06/23 1800 SCDs Start: 07/04/23 1916  Level of care: Med-Surg   Code Status: Full Code  Subjective: No significant events overnight, patient was laying in the bed comfortably, pain is under control, no discomfort. Patient is awaiting for placement    Physical Exam: Vitals:   07/17/23 1559 07/17/23 1946 07/18/23 0525 07/18/23 0847  BP: 111/70 118/63 116/61 111/63  Pulse: 62 67 65 (!) 59  Resp:   16 18  Temp: 98.1 F (36.7 C) 98 F (36.7 C) 98 F (36.7 C) 98 F (36.7 C)  TempSrc: Oral  Oral   SpO2: 100% 98% 99% 97%  Weight:      Height:        General - Middle aged Caucasian male, no apparent distress HEENT - PERRLA, EOMI, atraumatic head, non tender sinuses. Lung - Clear, basal rales, no rhonchi, wheezes. Heart - S1, S2 heard, no murmurs, rubs, trace pedal  edema. Abdomen - Soft, non tender, obese, foley intact. Neuro - alert, awake, slow mentation, mild dysarthria, impulsive movements of extremities.  Left hemiparesis, residual weakness due to prior stroke Skin - Warm and dry. Right leg cast and dressing intact  Data Reviewed:      Latest Ref Rng & Units 07/14/2023   10:13 AM 07/12/2023    3:11 AM 07/11/2023   10:48 AM  CBC  WBC 4.0 - 10.5 K/uL 8.1  9.1  9.3   Hemoglobin 13.0 - 17.0 g/dL 56.2  13.0  86.5   Hematocrit 39.0 - 52.0 % 37.7  36.5  39.3   Platelets 150 - 400 K/uL 357  351  336       Latest Ref Rng & Units 07/14/2023   10:13 AM 07/12/2023    3:11 AM 07/11/2023   10:48 AM  BMP  Glucose 70 - 99 mg/dL 784  95  696   BUN 6 - 20 mg/dL 20  22  17    Creatinine 0.61 - 1.24 mg/dL 2.95  2.84  1.32   Sodium 135 - 145 mmol/L 131  129  129   Potassium 3.5 - 5.1 mmol/L 4.0  4.0  4.2   Chloride 98 - 111 mmol/L 98  96  98   CO2 22 - 32 mmol/L 24  25  24    Calcium  8.9 - 10.3 mg/dL 8.5  8.4  8.3    No results found.  Family Discussion- Discussed with sister, son over phone. Sister upset about his care. He is disabled and need more attention and family wishes to go to a facility where he can be taken care.   Disposition: Status is: Inpatient Remains inpatient appropriate because: Neurology evaluation, SNF placement.  Planned Discharge Destination: Skilled nursing facility  Clinically stable, medically optimized to discharge. DC to SNF when bed will be available. 5/19 as per [facility] patient will go tomorrow to SNF     Time spent: 40 minutes  Author: Althia Atlas, MD 07/18/2023 12:16 PM Secure chat 7am to 7pm For on call review [email or web address].
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
## 2023-07-18 NOTE — TOC Progression Note (Signed)
 Transition of Care [facility]) - Progression Note    Patient Details  Name: Jared Grimes MRN: [id number] Date of Birth: 08/11/1965  [facility]) CM/SW Contact  Alexandra Ice, RN Phone Number: 07/18/2023, 12:21 PM  Clinical Narrative:     Just sent facility updated discharge summary and orders to facility via HUB. Sent message Bonnell Butcher at [facility] letting her know they were just sent. Patient pending room assignment.        Expected Discharge Plan and Services         Expected Discharge Date: 07/18/23                                     Social Determinants of Health (SDOH) Interventions SDOH Screenings   Food Insecurity: No Food Insecurity (07/04/2023)  Housing: Low Risk  (07/04/2023)  Transportation Needs: No Transportation Needs (07/04/2023)  Utilities: Not At Risk (07/04/2023)  Financial Resource Strain: Low Risk  (01/02/2023)   Received from [facility]  Social Connections: Patient Declined (07/04/2023)  Tobacco Use: Low Risk  (07/04/2023)    Readmission Risk Interventions     No data to display

## 2023-08-25 ENCOUNTER — Emergency Department

## 2023-08-25 ENCOUNTER — Emergency Department
Admission: EM | Admit: 2023-08-25 | Discharge: 2023-08-25 | Disposition: A | Discharge status: Home / Self Care | Attending: Emergency Medicine | Admitting: Emergency Medicine

## 2023-08-25 ENCOUNTER — Other Ambulatory Visit: Payer: Self-pay

## 2023-08-25 DIAGNOSIS — I10 Essential (primary) hypertension: Secondary | ICD-10-CM | POA: Insufficient documentation

## 2023-08-25 DIAGNOSIS — K8020 Calculus of gallbladder without cholecystitis without obstruction: Secondary | ICD-10-CM | POA: Diagnosis not present

## 2023-08-25 DIAGNOSIS — R1084 Generalized abdominal pain: Secondary | ICD-10-CM

## 2023-08-25 DIAGNOSIS — E876 Hypokalemia: Secondary | ICD-10-CM | POA: Insufficient documentation

## 2023-08-25 DIAGNOSIS — R112 Nausea with vomiting, unspecified: Secondary | ICD-10-CM | POA: Diagnosis present

## 2023-08-25 DIAGNOSIS — R519 Headache, unspecified: Secondary | ICD-10-CM | POA: Insufficient documentation

## 2023-08-25 LAB — URINALYSIS, ROUTINE W REFLEX MICROSCOPIC
Bilirubin Urine: NEGATIVE
Glucose, UA: NEGATIVE mg/dL
Hgb urine dipstick: NEGATIVE
Ketones, ur: NEGATIVE mg/dL
Leukocytes,Ua: NEGATIVE
Nitrite: NEGATIVE
Protein, ur: NEGATIVE mg/dL
Specific Gravity, Urine: 1.009 (ref 1.005–1.030)
pH: 7 (ref 5.0–8.0)

## 2023-08-25 LAB — CBC
HCT: 46.3 % (ref 39.0–52.0)
Hemoglobin: 15.8 g/dL (ref 13.0–17.0)
MCH: 30.5 pg (ref 26.0–34.0)
MCHC: 34.1 g/dL (ref 30.0–36.0)
MCV: 89.4 fL (ref 80.0–100.0)
Platelets: 345 10*3/uL (ref 150–400)
RBC: 5.18 MIL/uL (ref 4.22–5.81)
RDW: 11.9 % (ref 11.5–15.5)
WBC: 6.1 10*3/uL (ref 4.0–10.5)
nRBC: 0 % (ref 0.0–0.2)

## 2023-08-25 LAB — COMPREHENSIVE METABOLIC PANEL WITH GFR
ALT: 16 U/L (ref 0–44)
AST: 16 U/L (ref 15–41)
Albumin: 4.2 g/dL (ref 3.5–5.0)
Alkaline Phosphatase: 48 U/L (ref 38–126)
Anion gap: 10 (ref 5–15)
BUN: 11 mg/dL (ref 6–20)
CO2: 27 mmol/L (ref 22–32)
Calcium: 9.9 mg/dL (ref 8.9–10.3)
Chloride: 91 mmol/L — ABNORMAL LOW (ref 98–111)
Creatinine, Ser: 0.77 mg/dL (ref 0.61–1.24)
GFR, Estimated: >60 mL/min (ref >60)
Glucose, Bld: 97 mg/dL (ref 70–99)
Potassium: 4.8 mmol/L (ref 3.5–5.1)
Sodium: 128 mmol/L — ABNORMAL LOW (ref 135–145)
Total Bilirubin: 0.5 mg/dL (ref 0.0–1.2)
Total Protein: 7.7 g/dL (ref 6.5–8.1)

## 2023-08-25 LAB — LIPASE, BLOOD: Lipase: 34 U/L (ref 11–51)

## 2023-08-25 MED ORDER — SODIUM CHLORIDE 0.9 % IV BOLUS
1000.0000 mL | Freq: Once | INTRAVENOUS | Status: AC
Start: 2023-08-25 — End: 2023-08-25
  Administered 2023-08-25: 1000 mL via INTRAVENOUS

## 2023-08-25 MED ORDER — MORPHINE SULFATE (PF) 4 MG/ML IV SOLN
4.0000 mg | Freq: Once | INTRAVENOUS | Status: AC
  Administered 2023-08-25: 4 mg via INTRAVENOUS
  Filled 2023-08-25: qty 1

## 2023-08-25 MED ORDER — IOHEXOL 300 MG/ML  SOLN
100.0000 mL | Freq: Once | INTRAMUSCULAR | Status: AC | PRN
  Administered 2023-08-25: 100 mL via INTRAVENOUS

## 2023-08-25 MED ORDER — ONDANSETRON HCL 4 MG/2ML IJ SOLN
4.0000 mg | Freq: Once | INTRAMUSCULAR | Status: AC
  Administered 2023-08-25: 4 mg via INTRAVENOUS
  Filled 2023-08-25: qty 2

## 2023-08-25 MED ORDER — ONDANSETRON HCL 4 MG PO TABS: 4.0000 mg | ORAL_TABLET | Freq: Four times a day (QID) | ORAL | 0 refills | Status: AC | PRN

## 2023-08-25 NOTE — ED Provider Notes (Signed)
 [facility] Provider Note    Event Date/Time   First MD Initiated Contact with Patient 08/25/23 1302     (approximate)   History   Headache and Nausea   HPI  Jared GALIK is a 58 year old male with history of TBI, HTN, CVA presenting to the emergency department for evaluation nausea and vomiting.  Patient reports that for the past few days he has had nausea in the morning with occasional episodes of vomiting.  He additionally reports a headache, not sudden in onset, waxing and waning.  No new numbness, tingling, weakness.  His sister accompanies him and provides additional history.  She notes that patient has also recently been complaining of abdominal pain.  Had an x-Mayan Dolney performed at his facility does concerning for constipation, was given laxatives with reported improvement on repeat x-Marcelene Weidemann.  No fevers or chills.     Physical Exam   Triage Vital Signs: ED Triage Vitals  Encounter Vitals Group     BP 08/25/23 1208 (!) 159/87     Girls Systolic BP Percentile --      Girls Diastolic BP Percentile --      Boys Systolic BP Percentile --      Boys Diastolic BP Percentile --      Pulse Rate 08/25/23 1206 (!) 58     Resp 08/25/23 1206 20     Temp 08/25/23 1206 98.7 F (37.1 C)     Temp Source 08/25/23 1206 Oral     SpO2 08/25/23 1206 99 %     Weight 08/25/23 1206 180 lb (81.6 kg)     Height 08/25/23 1206 5' 9 (1.753 m)     Head Circumference --      Peak Flow --      Pain Score 08/25/23 1206 5     Pain Loc --      Pain Education --      Exclude from Growth Chart --     Most recent vital signs: Vitals:   08/25/23 1208 08/25/23 1633  BP: (!) 159/87 (!) 148/88  Pulse:  62  Resp:  20  Temp:  98.5 F (36.9 C)  SpO2:  99%     General: Awake, interactive  CV:  Regular rate, good peripheral perfusion.  Resp:  Unlabored respirations.  Abd:  Nondistended, soft, mild generalized tenderness Neuro:  Significant weakness of left arm consistent with  patient's baseline, otherwise on 5 strength of the right upper and bilateral lower extremities.  Normal finger-nose testing with right arm.   ED Results / Procedures / Treatments   Labs (all labs ordered are listed, but only abnormal results are displayed) Labs Reviewed  COMPREHENSIVE METABOLIC PANEL WITH GFR - Abnormal; Notable for the following components:      Result Value   Sodium 128 (*)    Chloride 91 (*)    All other components within normal limits  URINALYSIS, ROUTINE W REFLEX MICROSCOPIC - Abnormal; Notable for the following components:   Color, Urine YELLOW (*)    APPearance HAZY (*)    All other components within normal limits  LIPASE, BLOOD  CBC     EKG EKG independently reviewed and interpreted by myself demonstrates:    RADIOLOGY Imaging independently reviewed and interpreted by myself demonstrates:  CT head without acute bleed CT A/P without acute abnormality, does note gallstones without evidence of cholecystitis   Formal Radiology Read:  CT ABDOMEN PELVIS W CONTRAST Result Date: 08/25/2023 CLINICAL DATA:  Acute abdominal  pain.  Nausea. EXAM: CT ABDOMEN AND PELVIS WITH CONTRAST TECHNIQUE: Multidetector CT imaging of the abdomen and pelvis was performed using the standard protocol following bolus administration of intravenous contrast. RADIATION DOSE REDUCTION: This exam was performed according to the departmental dose-optimization program which includes automated exposure control, adjustment of the mA and/or kV according to patient size and/or use of iterative reconstruction technique. CONTRAST:  100mL OMNIPAQUE IOHEXOL 300 MG/ML  SOLN COMPARISON:  None Available. FINDINGS: Lower Chest: No acute findings. Hepatobiliary: No suspicious hepatic masses identified. Small calcified gallstones are seen, but without signs of cholecystitis or biliary dilatation. Pancreas:  No mass or inflammatory changes. Spleen: Within normal limits in size and appearance. Adrenals/Urinary  Tract: No suspicious masses identified. No evidence of ureteral calculi or hydronephrosis. Unremarkable unopacified urinary bladder. Stomach/Bowel: No evidence of obstruction, inflammatory process or abnormal fluid collections. Normal appendix visualized. Vascular/Lymphatic: No pathologically enlarged lymph nodes. No acute vascular findings. Reproductive:  No mass or other significant abnormality. Other:  None. Musculoskeletal: No suspicious bone lesions identified. Right femoral intramedullary rod noted. Old L1 vertebral body compression fracture again noted, as seen on previous lumbar radiographs in 2022. IMPRESSION: No acute findings. Cholelithiasis. No radiographic evidence of cholecystitis. Electronically Signed   By: Norleen DELENA Kil M.D.   On: 08/25/2023 17:12   CT Head Wo Contrast Result Date: 08/25/2023 CLINICAL DATA:  Headache, new onset. Headache and nausea for few days. EXAM: CT HEAD WITHOUT CONTRAST TECHNIQUE: Contiguous axial images were obtained from the base of the skull through the vertex without intravenous contrast. RADIATION DOSE REDUCTION: This exam was performed according to the departmental dose-optimization program which includes automated exposure control, adjustment of the mA and/or kV according to patient size and/or use of iterative reconstruction technique. COMPARISON:  MRI head 04/08/2019. FINDINGS: Brain: No acute intracranial hemorrhage. No CT evidence of acute large territory infarct. Redemonstrated remote infarcts in the anterior right frontal lobe. Additional remote infarct in the left superior frontal gyrus. Nonspecific hypoattenuation in the periventricular and subcortical white matter favored to reflect chronic microvascular ischemic changes. Mild parenchymal volume loss. Remote lacunar infarct in the posterior right lentiform nucleus. Basilar cisterns are patent. No extra-axial fluid collections. Ventricles: The ventricles are normal. Vascular: Atherosclerotic calcifications of  the carotid siphons. No hyperdense vessel. Skull: No acute or aggressive finding. Orbits: Orbits are symmetric. Sinuses: The visualized paranasal sinuses are clear. Other: Mastoid air cells are clear. IMPRESSION: No CT evidence of acute intracranial abnormality. Remote infarcts in the bilateral frontal lobes. Remote lacunar infarct in the posterior right lentiform nucleus. Chronic microvascular ischemic changes and mild parenchymal volume loss. Electronically Signed   By: Donnice Mania M.D.   On: 08/25/2023 16:52    PROCEDURES:  Critical Care performed: No  Procedures   MEDICATIONS ORDERED IN ED: Medications  sodium chloride  0.9 % bolus 1,000 mL (0 mLs Intravenous Stopped 08/25/23 1634)  ondansetron  (ZOFRAN ) injection 4 mg (4 mg Intravenous Given 08/25/23 1336)  morphine  (PF) 4 MG/ML injection 4 mg (4 mg Intravenous Given 08/25/23 1338)  iohexol (OMNIPAQUE) 300 MG/ML solution 100 mL (100 mLs Intravenous Contrast Given 08/25/23 1633)     IMPRESSION / MDM / ASSESSMENT AND PLAN / ED COURSE  I reviewed the triage vital signs and the nursing notes.  Differential diagnosis includes, but is not limited to, colitis, appendicitis, biliary pathology, other acute intra-abdominal process, intracranial bleed, viral illness  Patient's presentation is most consistent with acute presentation with potential threat to life or bodily function.  57 year old male presenting  with vomiting, headache, abdominal pain.  Labs from triage notable for hyponatremia with sodium of 128, however I do note that this is not significantly different than patient's recent admission where he had most recent values of 129-131.  Has had some vomiting, possible component of hypovolemic hyponatremia.  Ordered for IV fluids as well as morphine  and Zofran  for symptomatic relief.  With headache and abdominal pain, will obtain CTs to further evaluate.  CT head without acute bleed.  CT abdomen pelvis without acute findings.  Notes  gallstones, no right upper quadrant tenderness on exam suggestive of acute cholecystitis.  Will provide information for follow-up with general surgery as needed.  Patient without further vomiting here.  Updated on results of workup.  He is comfortable with discharge.  Strict return precautions provided.  Patient discharged in stable condition.       FINAL CLINICAL IMPRESSION(S) / ED DIAGNOSES   Final diagnoses:  Nausea and vomiting, unspecified vomiting type  Acute nonintractable headache, unspecified headache type  Generalized abdominal pain  Gallstones     Rx / DC Orders   ED Discharge Orders          Ordered    ondansetron  (ZOFRAN ) 4 MG tablet  Every 6 hours PRN        08/25/23 1745             Note:  This document was prepared using Dragon voice recognition software and may include unintentional dictation errors.   Levander Slate, MD 08/25/23 [phone]

## 2023-08-25 NOTE — ED Notes (Signed)
 [facility] called for transport to [facility] , spoke with Alm
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
## 2023-08-25 NOTE — Discharge Instructions (Addendum)
 Your testing today was fortunately reassuring. I have included a prescription for nausea medication that you can take as needed. Follow-up with your PCP for further evaluation. Return to the ER for new or worsening symptoms.

## 2023-08-29 ENCOUNTER — Encounter: Payer: Self-pay | Admitting: Internal Medicine

## 2023-09-15 ENCOUNTER — Emergency Department

## 2023-09-15 ENCOUNTER — Encounter: Payer: Self-pay | Admitting: Emergency Medicine

## 2023-09-15 ENCOUNTER — Emergency Department
Admission: EM | Admit: 2023-09-15 | Discharge: 2023-09-15 | Disposition: A | Discharge status: Home / Self Care | Attending: Emergency Medicine | Admitting: Emergency Medicine

## 2023-09-15 ENCOUNTER — Other Ambulatory Visit: Payer: Self-pay

## 2023-09-15 DIAGNOSIS — M7989 Other specified soft tissue disorders: Secondary | ICD-10-CM | POA: Insufficient documentation

## 2023-09-15 LAB — CBC WITH DIFFERENTIAL/PLATELET
Abs Immature Granulocytes: 0.01 K/uL (ref 0.00–0.07)
Basophils Absolute: 0 K/uL (ref 0.0–0.1)
Basophils Relative: 0 %
Eosinophils Absolute: 0.2 K/uL (ref 0.0–0.5)
Eosinophils Relative: 4 %
HCT: 37.8 % — ABNORMAL LOW (ref 39.0–52.0)
Hemoglobin: 12.9 g/dL — ABNORMAL LOW (ref 13.0–17.0)
Immature Granulocytes: 0 %
Lymphocytes Relative: 25 %
Lymphs Abs: 1.3 K/uL (ref 0.7–4.0)
MCH: 30.5 pg (ref 26.0–34.0)
MCHC: 34.1 g/dL (ref 30.0–36.0)
MCV: 89.4 fL (ref 80.0–100.0)
Monocytes Absolute: 0.5 K/uL (ref 0.1–1.0)
Monocytes Relative: 10 %
Neutro Abs: 3.1 K/uL (ref 1.7–7.7)
Neutrophils Relative %: 61 %
Platelets: 307 K/uL (ref 150–400)
RBC: 4.23 MIL/uL (ref 4.22–5.81)
RDW: 12.6 % (ref 11.5–15.5)
WBC: 5.2 K/uL (ref 4.0–10.5)
nRBC: 0 % (ref 0.0–0.2)

## 2023-09-15 LAB — BASIC METABOLIC PANEL WITH GFR
Anion gap: 12 (ref 5–15)
BUN: 19 mg/dL (ref 6–20)
CO2: 25 mmol/L (ref 22–32)
Calcium: 9.1 mg/dL (ref 8.9–10.3)
Chloride: 99 mmol/L (ref 98–111)
Creatinine, Ser: 0.86 mg/dL (ref 0.61–1.24)
GFR, Estimated: >60 mL/min (ref >60)
Glucose, Bld: 96 mg/dL (ref 70–99)
Potassium: 4.3 mmol/L (ref 3.5–5.1)
Sodium: 136 mmol/L (ref 135–145)

## 2023-09-15 NOTE — ED Triage Notes (Signed)
 Patient to ED via [facility] from home for right foot swelling. Pt reports having surgery on foot approx 10 weeks ago. Pt states he took the brace off 3-4 days ago and has noticed increased swelling since then. States he does have some pain in foot when standing.

## 2023-09-15 NOTE — ED Provider Notes (Signed)
 [facility] Provider Note    Event Date/Time   First MD Initiated Contact with Patient 09/15/23 1542     (approximate)   History   Foot Swelling   HPI  Jared Grimes is a 58 y.o. male who presents to the ED for evaluation of Foot Swelling   I review a 7/1 podiatry clinic visit, cast removed during this visit.  5/7 ORIF for trimalleolar fracture on the right side.  Patient presents to the ED due to diffuse soft tissue swelling throughout his right lower extremity.  Reports that he does notice that in the past week but was not really looking at my foot prior to this and he does not know if it was present when the cast is removed.  No changes to the swelling over the past week but he just wanted the doctor to look at it and give him medicine to fix it.   Physical Exam   Triage Vital Signs: ED Triage Vitals [09/15/23 1440]  Encounter Vitals Group     BP (!) 143/99     Girls Systolic BP Percentile      Girls Diastolic BP Percentile      Boys Systolic BP Percentile      Boys Diastolic BP Percentile      Pulse Rate 92     Resp 17     Temp 98.1 F (36.7 C)     Temp Source Oral     SpO2 99 %     Weight 200 lb (90.7 kg)     Height 5' 9 (1.753 m)     Head Circumference      Peak Flow      Pain Score 3     Pain Loc      Pain Education      Exclude from Growth Chart     Most recent vital signs: Vitals:   09/15/23 1440  BP: (!) 143/99  Pulse: 92  Resp: 17  Temp: 98.1 F (36.7 C)  SpO2: 99%    General: Awake, no distress.  CV:  Good peripheral perfusion.  Resp:  Normal effort.  Abd:  No distention.  MSK:  No deformity noted.  Neuro:  No focal deficits appreciated. Other:  Diffuse soft tissue swelling of the right lower extremity, right foot, right calf does not really extend above the right knee.  Well-healed surgical incisions are present.  Strong DP pulse, brisk capillary refill without signs of ischemia or neurologic  deficits.   ED Results / Procedures / Treatments   Labs (all labs ordered are listed, but only abnormal results are displayed) Labs Reviewed  CBC WITH DIFFERENTIAL/PLATELET - Abnormal; Notable for the following components:      Result Value   Hemoglobin 12.9 (*)    HCT 37.8 (*)    All other components within normal limits  BASIC METABOLIC PANEL WITH GFR    EKG   RADIOLOGY Plain films right foot interpreted by me without evidence of acute fracture or dislocation.  No hardware alignment or soft tissue gas Venous ultrasound of the right leg interpreted by me without evidence of DVT  Official radiology report(s): US  Venous Img Lower Unilateral Right Result Date: 09/15/2023 CLINICAL DATA:  Recent fracture with surgery and immobilization. Swelling and bruising. EXAM: Right LOWER EXTREMITY VENOUS DOPPLER ULTRASOUND TECHNIQUE: Gray-scale sonography with compression, as well as color and duplex ultrasound, were performed to evaluate the deep venous system(s) from the level of the common femoral vein  through the popliteal and proximal calf veins. COMPARISON:  None Available. FINDINGS: VENOUS Normal compressibility of the common femoral, superficial femoral, and popliteal veins, as well as the visualized calf veins. Visualized portions of profunda femoral vein and great saphenous vein unremarkable. No filling defects to suggest DVT on grayscale or color Doppler imaging. Doppler waveforms show normal direction of venous flow, normal respiratory plasticity and response to augmentation. Limited views of the contralateral common femoral vein are unremarkable. OTHER None. Limitations: none IMPRESSION: No evidence of right lower extremity DVT. Electronically Signed   By: Ranell Bring M.D.   On: 09/15/2023 17:14   DG Foot Complete Right Result Date: 09/15/2023 CLINICAL DATA:  Foot swelling, recent surgery, pain EXAM: RIGHT FOOT COMPLETE - 3+ VIEW COMPARISON:  Jul 05, 2018 5 and prior studies FINDINGS:  Partially evaluated plate screw fixation hardware in the right distal tibia and fibular fractures with unchanged fracture alignment. No new fractures are identified. Joint spaces are preserved. Diffuse soft tissue swelling about the foot. IMPRESSION: Similar alignment of right distal tibia and fibula fracture fixation hardware. No new fractures are identified. Diffuse soft tissue swelling. Electronically Signed   By: Michaeline Blanch M.D.   On: 09/15/2023 15:19    PROCEDURES and INTERVENTIONS:  Procedures  Medications - No data to display   IMPRESSION / MDM / ASSESSMENT AND PLAN / ED COURSE  I reviewed the triage vital signs and the nursing notes.  Differential diagnosis includes, but is not limited to, DVT, arterial ischemia, venous insufficiency  {Patient presents with symptoms of an acute illness or injury that is potentially life-threatening.  Patient has been immobilized for greater than 2 months presents with swelling of the right leg.  No systemic symptoms.  No neurologic or vascular deficits.  Will ultrasound to assess for DVT.  Normal CBC and metabolic panel.  No concerns for superimposed infectious features.   Ultrasound negative for dvt  Clinical Course as of 09/15/23 1726  Fri Sep 15, 2023  1726 Reassessed and discussed reassuring ultrasound results.  Likely etiology of symptoms, follow-up with podiatry, physical therapy and appropriate ED return precautions [DS]    Clinical Course User Index [DS] Claudene Rover, MD     FINAL CLINICAL IMPRESSION(S) / ED DIAGNOSES   Final diagnoses:  Right leg swelling     Rx / DC Orders   ED Discharge Orders     None        Note:  This document was prepared using Dragon voice recognition software and may include unintentional dictation errors.   Claudene Rover, MD 09/15/23 1726

## 2024-02-07 ENCOUNTER — Encounter: Payer: Self-pay | Admitting: Gastroenterology

## 2024-02-07 ENCOUNTER — Encounter: Payer: Self-pay | Admitting: Anesthesiology

## 2024-02-07 ENCOUNTER — Ambulatory Visit: Payer: Self-pay | Admitting: Anesthesiology

## 2024-02-07 ENCOUNTER — Ambulatory Visit
Admission: RE | Admit: 2024-02-07 | Discharge: 2024-02-07 | Disposition: A | Discharge status: Home / Self Care | Attending: Gastroenterology | Admitting: Gastroenterology

## 2024-02-07 ENCOUNTER — Encounter: Admission: RE | Disposition: A | Payer: Self-pay | Source: Home / Self Care | Attending: Gastroenterology

## 2024-02-07 HISTORY — PX: POLYPECTOMY: SHX149

## 2024-02-07 HISTORY — PX: COLONOSCOPY: SHX5424

## 2024-02-07 SURGERY — COLONOSCOPY
Anesthesia: General

## 2024-02-07 MED ORDER — PROPOFOL 500 MG/50ML IV EMUL
INTRAVENOUS | Status: DC | PRN
  Administered 2024-02-07: 150 ug/kg/min via INTRAVENOUS

## 2024-02-07 MED ORDER — SODIUM CHLORIDE 0.9 % IV SOLN
INTRAVENOUS | Status: DC
  Administered 2024-02-07: 500 mL via INTRAVENOUS

## 2024-02-07 MED ORDER — PROPOFOL 10 MG/ML IV BOLUS
INTRAVENOUS | Status: DC | PRN
  Administered 2024-02-07: 40 mg via INTRAVENOUS
  Administered 2024-02-07: 10 mg via INTRAVENOUS

## 2024-02-07 NOTE — Op Note (Signed)
 [facility] Gastroenterology Patient Name: Jared Grimes Procedure Date: 02/07/2024 9:58 AM MRN: [id number] Account #: [id number] Date of Birth: Sep 26, 1965 Admit Type: Outpatient Age: 58 Room: [facility] ENDO ROOM 3 Gender: Male Note Status: Finalized Instrument Name: Colon Scope [id number] Procedure:             Colonoscopy Indications:           Abdominal pain, Rectal bleeding Providers:             Ruel Kung MD, MD Referring MD:          Ruel Kung MD, MD (Referring MD), Alm Needle                         (Referring MD) Medicines:             Monitored Anesthesia Care Complications:         No immediate complications. Procedure:             Pre-Anesthesia Assessment:                        - Prior to the procedure, a History and Physical was                         performed, and patient medications, allergies and                         sensitivities were reviewed. The patient's tolerance                         of previous anesthesia was reviewed.                        - The risks and benefits of the procedure and the                         sedation options and risks were discussed with the                         patient. All questions were answered and informed                         consent was obtained.                        - ASA Grade Assessment: II - A patient with mild                         systemic disease.                        After obtaining informed consent, the colonoscope was                         passed under direct vision. Throughout the procedure,                         the patient's blood pressure, pulse, and oxygen                         saturations were monitored  continuously. The                         Colonoscope was introduced through the anus and                         advanced to the the cecum, identified by the                         appendiceal orifice. The colonoscopy was performed                         with ease.  The patient tolerated the procedure well.                         The quality of the bowel preparation was adequate. The                         ileocecal valve, appendiceal orifice, and rectum were                         photographed. Findings:      The perianal and digital rectal examinations were normal.      A 10 mm polyp was found in the descending colon. The polyp was       semi-sessile. The polyp was removed with a hot snare. Resection and       retrieval were complete.      Two sessile polyps were found in the proximal ascending colon. The       polyps were 4 to 5 mm in size. These polyps were removed with a cold       snare. Resection and retrieval were complete.      The exam was otherwise without abnormality on direct and retroflexion       views. Impression:            - One 10 mm polyp in the descending colon, removed                         with a hot snare. Resected and retrieved.                        - Two 4 to 5 mm polyps in the proximal ascending                         colon, removed with a cold snare. Resected and                         retrieved.                        - The examination was otherwise normal on direct and                         retroflexion views. Recommendation:        - Discharge patient to home (with escort).                        - Resume previous diet.                        -  Continue present medications.                        - Await pathology results.                        - Repeat colonoscopy for surveillance based on                         pathology results.                        - Return to GI office PRN. Procedure Code(s):     --- Professional ---                        [phone], Colonoscopy, flexible; with removal of                         tumor(s), polyp(s), or other lesion(s) by snare                         technique Diagnosis Code(s):     --- Professional ---                        D12.4, Benign neoplasm of descending  colon                        D12.2, Benign neoplasm of ascending colon                        R10.9, Unspecified abdominal pain                        K62.5, Hemorrhage of anus and rectum CPT copyright 2022 [employer]. All rights reserved. The codes documented in this report are preliminary and upon coder review may  be revised to meet current compliance requirements. Ruel Kung, MD Ruel Kung MD, MD 02/07/2024 10:34:03 AM This report has been signed electronically. Number of Addenda: 0 Note Initiated On: 02/07/2024 9:58 AM Scope Withdrawal Time: 0 hours 14 minutes 2 seconds  Total Procedure Duration: 0 hours 16 minutes 5 seconds  Estimated Blood Loss:  Estimated blood loss: none.      [facility]

## 2024-02-07 NOTE — H&P (Signed)
 Ruel Kung , MD [address], [place], [place], [address] Phone: [phone] Fax: [phone]  Primary Care Physician:  Epifanio Alm SQUIBB, MD   Pre-Procedure History & Physical: HPI:  Jared Grimes is a 58 y.o. male is here for an colonoscopy.   Past Medical History:  Diagnosis Date   Hypertension    Seizures (HCC)    Stroke [facility])     Past Surgical History:  Procedure Laterality Date   COLONOSCOPY WITH PROPOFOL  N/A 10/22/2015   Procedure: COLONOSCOPY WITH PROPOFOL ;  Surgeon: Gladis RAYMOND Mariner, MD;  Location: [facility] ENDOSCOPY;  Service: Endoscopy;  Laterality: N/A;   COLONOSCOPY WITH PROPOFOL  N/A 01/19/2022   Procedure: COLONOSCOPY WITH PROPOFOL ;  Surgeon: Kung Ruel, MD;  Location: [facility] ENDOSCOPY;  Service: Gastroenterology;  Laterality: N/A;   ESOPHAGOGASTRODUODENOSCOPY (EGD) WITH PROPOFOL  N/A 09/08/2020   Procedure: ESOPHAGOGASTRODUODENOSCOPY (EGD) WITH PROPOFOL ;  Surgeon: Kung Ruel, MD;  Location: [facility] ENDOSCOPY;  Service: Gastroenterology;  Laterality: N/A;   ESOPHAGOGASTRODUODENOSCOPY (EGD) WITH PROPOFOL  N/A 10/29/2020   Procedure: ESOPHAGOGASTRODUODENOSCOPY (EGD) WITH PROPOFOL ;  Surgeon: Kung Ruel, MD;  Location: [facility] ENDOSCOPY;  Service: Gastroenterology;  Laterality: N/A;   NERVE SURGERY     ORIF ANKLE FRACTURE Right 07/05/2023   Procedure: OPEN REDUCTION INTERNAL FIXATION (ORIF) ANKLE FRACTURE;  Surgeon: Ashley Soulier, DPM;  Location: [facility] ORS;  Service: Orthopedics/Podiatry;  Laterality: Right;   VAGUS NERVE STIMULATOR INSERTION N/A 02/03/2015   Procedure: VAGAL NERVE STIMULATOR IMPLANT LEFT SIDED;  Surgeon: Gerldine Maizes, MD;  Location: [facility] NEURO ORS;  Service: Neurosurgery;  Laterality: N/A;    Prior to Admission medications   Medication Sig Start Date End Date Taking? Authorizing Provider  acetaminophen  (TYLENOL ) 325 MG tablet  Take 2 tablets (650 mg total) by mouth every 6 (six) hours as needed for mild pain (pain score 1-3), fever, moderate pain (pain score 4-6) or headache. 07/14/23   Von Bellis, MD  amLODipine  (NORVASC ) 5 MG tablet Take 5 mg by mouth daily. 08/24/20   [provider]  atenolol  (TENORMIN ) 50 MG tablet Take 50 mg by mouth daily. 07/28/20   [provider]  betamethasone dipropionate 0.05 % cream Apply 1 application topically 2 (two) times daily as needed. 07/28/20   [provider]  bisacodyl  (DULCOLAX) 10 MG suppository Place 1 suppository (10 mg total) rectally as needed for severe constipation. 07/14/23   Von Bellis, MD  bisacodyl  (DULCOLAX) 5 MG EC tablet Take 2 tablets (10 mg total) by mouth at bedtime as needed for moderate constipation. 07/14/23 07/13/24  Von Bellis, MD  Calcium  Carbonate-Vitamin D  600-200 MG-UNIT TABS Take 1 tablet by mouth 2 (two) times daily.    [provider]  carbamazepine  (TEGRETOL  XR) 200 MG 12 hr tablet Take 3 tablets (600 mg total) by mouth 2 (two) times daily. 800 mg every morning and 600 mg at bedtime 02/20/16   Patel, Sona, MD  cholecalciferol (VITAMIN D3) 25 MCG (1000 UNIT) tablet Take 1 tablet by mouth daily.    [provider]  Cyanocobalamin ([employer] B-12 PO) Take 2 tablets by mouth daily.    [provider]  DULoxetine  (CYMBALTA ) 20 MG capsule Take 20 mg by mouth daily. 07/29/20   [provider]  hydrALAZINE  (APRESOLINE ) 50 MG tablet Take 1 tablet (50 mg total) by mouth 3 (three) times daily as needed (SBP >150). 07/14/23   Von Bellis, MD  lamoTRIgine  (LAMICTAL  XR) 100 MG 24 hour tablet Take 300 mg by mouth daily.    [provider]  methocarbamol  (ROBAXIN ) 500 MG tablet Take 1 tablet (500 mg total) by mouth every 8 (eight) hours as needed for muscle spasms. 07/14/23   Von Bellis, MD  polyethylene glycol (MIRALAX ) 17 g packet Take 17 g by mouth daily. 07/14/23   Von Bellis, MD  tamsulosin   (FLOMAX ) 0.4 MG CAPS capsule Take 1 capsule (0.4 mg total) by mouth daily after supper. 07/14/23   Von Bellis, MD  traZODone  (DESYREL ) 100 MG tablet Take 200 mg by mouth at bedtime as needed. 07/28/20   [provider]    Allergies as of 01/10/2024   (No Known Allergies)    No family history on file.  Social History   Socioeconomic History   Marital status: Single    Spouse name: Not on file   Number of children: Not on file   Years of education: Not on file   Highest education level: Not on file  Occupational History   Not on file  Tobacco Use   Smoking status: Never   Smokeless tobacco: Never  Vaping Use   Vaping status: Never Used  Substance and Sexual Activity   Alcohol use: No   Drug use: No   Sexual activity: Not on file  Other Topics Concern   Not on file  Social History Narrative   Not on file   Social Drivers of Health   Financial Resource Strain: Low Risk  (01/09/2024)   Received from [facility]   Overall Financial Resource Strain (CARDIA)    Difficulty of Paying Living Expenses: Not very hard  Food Insecurity: No Food Insecurity (01/09/2024)   [facility]   Hunger Vital Sign    Within the past 12 months, you worried that your food would run out before you got the money to buy more.: Never true    Within the past 12 months, the food you bought just didn't last and you didn't have money to get more.: Never true  Transportation Needs: No Transportation Needs (01/09/2024)   Received from [facility] - Transportation    In the past 12 months, has lack of transportation kept you from medical appointments or from getting medications?: No    Lack of Transportation (Non-Medical): No  Physical Activity: Not on file  Stress: Not on file  Social Connections: Patient Declined (07/04/2023)   Social Connection and Isolation Panel    Frequency of Communication with Friends and  Family: Patient declined    Frequency of Social Gatherings with Friends and Family: Patient declined    Attends Religious Services: Patient declined    Database Administrator or Organizations: Patient declined    Attends Banker Meetings: Patient declined    Marital Status: Patient declined  Intimate Partner Violence: Not At Risk (07/04/2023)   Humiliation, Afraid, Rape, and Kick questionnaire    Fear of Current or Ex-Partner: No    Emotionally Abused: No    Physically Abused: No    Sexually Abused: No    Review of Systems: See HPI, otherwise negative ROS  Physical Exam: There were no vitals taken for this visit. General:   Alert,  pleasant and cooperative in NAD Head:  Normocephalic and atraumatic. Neck:  Supple; no masses or thyromegaly. Lungs:  Clear throughout to auscultation, normal respiratory effort.    Heart:  +S1, +S2, Regular rate and rhythm, No edema. Abdomen:  Soft, nontender and nondistended. Normal bowel sounds, without guarding, and without rebound.  Neurologic:  Alert and  oriented x4;  grossly normal neurologically.  Impression/Plan: Jared Grimes is here for an colonoscopy to be performed for Screening colonoscopy , rectal bleeding.   Risks, benefits, limitations, and alternatives regarding  colonoscopy have been reviewed with the patient.  Questions have been answered.  All parties agreeable.   Ruel Kung, MD  02/07/2024, 9:00 AM

## 2024-02-07 NOTE — H&P (Signed)
 Jared Grimes , MD [address], [place], [place], [address] Phone: [phone] Fax: [phone]  Primary Care Physician:  Epifanio Alm SQUIBB, MD   Pre-Procedure History & Physical: HPI:  Jared Grimes is a 58 y.o. male is here for an colonoscopy.   Past Medical History:  Diagnosis Date   Hypertension    Seizures (HCC)    Stroke [facility])     Past Surgical History:  Procedure Laterality Date   COLONOSCOPY WITH PROPOFOL  N/A 10/22/2015   Procedure: COLONOSCOPY WITH PROPOFOL ;  Surgeon: Gladis RAYMOND Mariner, MD;  Location: [facility] ENDOSCOPY;  Service: Endoscopy;  Laterality: N/A;   COLONOSCOPY WITH PROPOFOL  N/A 01/19/2022   Procedure: COLONOSCOPY WITH PROPOFOL ;  Surgeon: Grimes Ruel, MD;  Location: [facility] ENDOSCOPY;  Service: Gastroenterology;  Laterality: N/A;   ESOPHAGOGASTRODUODENOSCOPY (EGD) WITH PROPOFOL  N/A 09/08/2020   Procedure: ESOPHAGOGASTRODUODENOSCOPY (EGD) WITH PROPOFOL ;  Surgeon: Grimes Ruel, MD;  Location: [facility] ENDOSCOPY;  Service: Gastroenterology;  Laterality: N/A;   ESOPHAGOGASTRODUODENOSCOPY (EGD) WITH PROPOFOL  N/A 10/29/2020   Procedure: ESOPHAGOGASTRODUODENOSCOPY (EGD) WITH PROPOFOL ;  Surgeon: Grimes Ruel, MD;  Location: [facility] ENDOSCOPY;  Service: Gastroenterology;  Laterality: N/A;   FRACTURE SURGERY     NERVE SURGERY     ORIF ANKLE FRACTURE Right 07/05/2023   Procedure: OPEN REDUCTION INTERNAL FIXATION (ORIF) ANKLE FRACTURE;  Surgeon: Ashley Soulier, DPM;  Location: [facility] ORS;  Service: Orthopedics/Podiatry;  Laterality: Right;   VAGUS NERVE STIMULATOR INSERTION N/A 02/03/2015   Procedure: VAGAL NERVE STIMULATOR IMPLANT LEFT SIDED;  Surgeon: Gerldine Maizes, MD;  Location: [facility] NEURO ORS;  Service: Neurosurgery;  Laterality: N/A;    Prior to Admission medications   Medication Sig Start Date End Date Taking? Authorizing Provider   acetaminophen  (TYLENOL ) 325 MG tablet Take 2 tablets (650 mg total) by mouth every 6 (six) hours as needed for mild pain (pain score 1-3), fever, moderate pain (pain score 4-6) or headache. 07/14/23  Yes Von Bellis, MD  amLODipine  (NORVASC ) 5 MG tablet Take 5 mg by mouth daily. 08/24/20  Yes [provider]  atenolol  (TENORMIN ) 50 MG tablet Take 50 mg by mouth daily. 07/28/20  Yes [provider]  betamethasone dipropionate 0.05 % cream Apply 1 application topically 2 (two) times daily as needed. 07/28/20  Yes [provider]  bisacodyl  (DULCOLAX) 10 MG suppository Place 1 suppository (10 mg total) rectally as needed for severe constipation. 07/14/23  Yes Von Bellis, MD  Calcium  Carbonate-Vitamin D  600-200 MG-UNIT TABS Take 1 tablet by mouth 2 (two) times daily.   Yes [provider]  cholecalciferol (VITAMIN D3) 25 MCG (1000 UNIT) tablet Take 1 tablet by mouth daily.   Yes [provider]  Cyanocobalamin (CVS B-12 PO) Take 2 tablets by mouth daily.   Yes [provider]  DULoxetine  (CYMBALTA ) 20 MG capsule Take 20 mg by mouth daily. 07/29/20  Yes [provider]  hydrALAZINE  (APRESOLINE ) 50 MG tablet Take 1 tablet (50 mg total) by mouth 3 (three) times daily as needed (SBP >150). 07/14/23  Yes Von Bellis, MD  lamoTRIgine  (LAMICTAL  XR) 100 MG 24 hour tablet Take 300 mg by mouth daily.   Yes [provider]  methocarbamol  (ROBAXIN ) 500 MG tablet Take 1 tablet (500 mg total) by mouth every 8 (eight) hours as needed for muscle spasms. 07/14/23  Yes Von Bellis, MD  polyethylene glycol (MIRALAX ) 17 g packet Take 17 g by mouth daily. 07/14/23  Yes Von Bellis, MD  tamsulosin  (FLOMAX ) 0.4 MG CAPS capsule Take 1  capsule (0.4 mg total) by mouth daily after supper. 07/14/23  Yes Von Bellis, MD  traZODone  (DESYREL ) 100 MG tablet Take 200 mg by mouth at bedtime as needed. 07/28/20  Yes [provider]  bisacodyl  (DULCOLAX) 5  MG EC tablet Take 2 tablets (10 mg total) by mouth at bedtime as needed for moderate constipation. 07/14/23 07/13/24  Von Bellis, MD  carbamazepine  (TEGRETOL  XR) 200 MG 12 hr tablet Take 3 tablets (600 mg total) by mouth 2 (two) times daily. 800 mg every morning and 600 mg at bedtime Patient not taking: Reported on 02/07/2024 02/20/16   Patel, Sona, MD    Allergies as of 01/10/2024   (No Known Allergies)    History reviewed. No pertinent family history.  Social History   Socioeconomic History   Marital status: Single    Spouse name: Not on file   Number of children: Not on file   Years of education: Not on file   Highest education level: Not on file  Occupational History   Not on file  Tobacco Use   Smoking status: Never   Smokeless tobacco: Never  Vaping Use   Vaping status: Never Used  Substance and Sexual Activity   Alcohol use: No   Drug use: No   Sexual activity: Not on file  Other Topics Concern   Not on file  Social History Narrative   Not on file   Social Drivers of Health   Financial Resource Strain: Low Risk  (01/09/2024)   Received from [facility]   Overall Financial Resource Strain (CARDIA)    Difficulty of Paying Living Expenses: Not very hard  Food Insecurity: No Food Insecurity (01/09/2024)   Received from [facility]   Hunger Vital Sign    Within the past 12 months, you worried that your food would run out before you got the money to buy more.: Never true    Within the past 12 months, the food you bought just didn't last and you didn't have money to get more.: Never true  Transportation Needs: No Transportation Needs (01/09/2024)   Received [facility] - [facility]    In the past 12 months, has lack of transportation kept you from medical appointments or from getting medications?: No    Lack of Transportation (Non-Medical): No  Physical Activity: Not on file  Stress: Not on  file  Social Connections: Patient Declined (07/04/2023)   Social Connection and Isolation Panel    Frequency of Communication with Friends and Family: Patient declined    Frequency of Social Gatherings with Friends and Family: Patient declined    Attends Religious Services: Patient declined    Database Administrator or Organizations: Patient declined    Attends Banker Meetings: Patient declined    Marital Status: Patient declined  Intimate Partner Violence: Not At Risk (07/04/2023)   Humiliation, Afraid, Rape, and Kick questionnaire    Fear of Current or Ex-Partner: No    Emotionally Abused: No    Physically Abused: No    Sexually Abused: No    Review of Systems: See HPI, otherwise negative ROS  Physical Exam: BP (!) 130/92   Pulse 82   Temp (!) 97.5 F (36.4 C) (Temporal)   Resp 18   Ht 5' 8 (1.727 m)   Wt 87.5 kg   SpO2 98%   BMI 29.35 kg/m  General:   Alert,  pleasant and cooperative in NAD Head:  Normocephalic and atraumatic. Neck:  Supple; no masses or thyromegaly. Lungs:  Clear throughout to auscultation, normal respiratory effort.    Heart:  +S1, +S2, Regular rate and rhythm, No edema. Abdomen:  Soft, nontender and nondistended. Normal bowel sounds, without guarding, and without rebound.   Neurologic:  Alert and  oriented x4;  grossly normal neurologically.  Impression/Plan: Jared Grimes is here for an colonoscopy to be performed for rectal bleeding .  Risks, benefits, limitations, and alternatives regarding  colonoscopy have been reviewed with the patient.  Questions have been answered.  All parties agreeable.   Jared Kung, MD  02/07/2024, 10:01 AM

## 2024-02-07 NOTE — Anesthesia Preprocedure Evaluation (Addendum)
 Anesthesia Evaluation  Patient identified by MRN, date of birth, ID band Patient awake    Reviewed: Allergy & Precautions, NPO status , Patient's Chart, lab work & pertinent test results  History of Anesthesia Complications Negative for: history of anesthetic complications  Airway Mallampati: III  TM Distance: >3 FB Neck ROM: full    Dental  (+) Poor Dentition   Pulmonary neg pulmonary ROS   Pulmonary exam normal        Cardiovascular hypertension, On Medications Normal cardiovascular exam     Neuro/Psych Seizures -, Poorly Controlled,  PSYCHIATRIC DISORDERS  Depression    TBI, s/p vagus nerve stimulator placement TIA Neuromuscular disease (LUE with muscle wasting, hand contractures, no sensation due to past trauma.) CVA, Residual Symptoms    GI/Hepatic negative GI ROS, Neg liver ROS,,,  Endo/Other  negative endocrine ROS    Renal/GU      Musculoskeletal   Abdominal  (+) + obese  Peds  Hematology  (+) Blood dyscrasia, anemia   Anesthesia Other Findings Status post placement of VNS (vagus nerve stimulation) device. States his voice becomes hoarse every 5 minutes for a few minutes.   Past Medical History: No date: Hypertension No date: Seizures (HCC) No date: Stroke [facility])  Past Surgical History: 10/22/2015: COLONOSCOPY WITH PROPOFOL ; N/A     Comment:  Procedure: COLONOSCOPY WITH PROPOFOL ;  Surgeon: Gladis RAYMOND Mariner, MD;  Location: [facility] ENDOSCOPY;  Service:               Endoscopy;  Laterality: N/A; 01/19/2022: COLONOSCOPY WITH PROPOFOL ; N/A     Comment:  Procedure: COLONOSCOPY WITH PROPOFOL ;  Surgeon: Therisa Bi, MD;  Location: [facility] ENDOSCOPY;  Service:               Gastroenterology;  Laterality: N/A; 09/08/2020: ESOPHAGOGASTRODUODENOSCOPY (EGD) WITH PROPOFOL ; N/A     Comment:  Procedure: ESOPHAGOGASTRODUODENOSCOPY (EGD) WITH               PROPOFOL ;  Surgeon: Therisa Bi, MD;   Location: [facility]               ENDOSCOPY;  Service: Gastroenterology;  Laterality: N/A; 10/29/2020: ESOPHAGOGASTRODUODENOSCOPY (EGD) WITH PROPOFOL ; N/A     Comment:  Procedure: ESOPHAGOGASTRODUODENOSCOPY (EGD) WITH               PROPOFOL ;  Surgeon: Therisa Bi, MD;  Location: [facility]               ENDOSCOPY;  Service: Gastroenterology;  Laterality: N/A; No date: NERVE SURGERY 02/03/2015: VAGUS NERVE STIMULATOR INSERTION; N/A     Comment:  Procedure: VAGAL NERVE STIMULATOR IMPLANT LEFT SIDED;                Surgeon: Gerldine Maizes, MD;  Location: [facility] NEURO ORS;               Service: Neurosurgery;  Laterality: N/A;  BMI    Body Mass Index: 30.71 kg/m      Reproductive/Obstetrics negative OB ROS                              Anesthesia Physical Anesthesia Plan  ASA: 3  Anesthesia Plan: General   Post-op Pain Management:    Induction: Intravenous  PONV Risk Score and Plan: 2 and Propofol  infusion and  TIVA  Airway Management Planned: Natural Airway  Additional Equipment:   Intra-op Plan:   Post-operative Plan:   Informed Consent: I have reviewed the patients History and Physical, chart, labs and discussed the procedure including the risks, benefits and alternatives for the proposed anesthesia with the patient or authorized representative who has indicated his/her understanding and acceptance.       Plan Discussed with: Anesthesiologist, CRNA and Surgeon  Anesthesia Plan Comments:         Anesthesia Quick Evaluation

## 2024-02-07 NOTE — Anesthesia Procedure Notes (Signed)
 Procedure Name: MAC Date/Time: 02/07/2024 10:10 AM  Performed by: Brandy Almarie BROCKS, CRNAPre-anesthesia Checklist: Emergency Drugs available, Patient identified, Suction available and Patient being monitored Oxygen Delivery Method: Nasal cannula

## 2024-02-07 NOTE — Transfer of Care (Signed)
 Immediate Anesthesia Transfer of Care Note  Patient: Jared Grimes  Procedure(s) Performed: COLONOSCOPY POLYPECTOMY, INTESTINE  Patient Location: PACU  Anesthesia Type:General  Level of Consciousness: drowsy  Airway & Oxygen Therapy: Patient Spontanous Breathing  Post-op Assessment: Report given to RN and Post -op Vital signs reviewed and stable  Post vital signs: Reviewed and stable  Last Vitals:  Vitals Value Taken Time  BP 123/60 02/07/24 10:34  Temp    Pulse 78 02/07/24 10:35  Resp 15 02/07/24 10:35  SpO2 98 % 02/07/24 10:35  Vitals shown include unfiled device data.  Last Pain:  Vitals:   02/07/24 1034  TempSrc:   PainSc: Asleep         Complications: No notable events documented.

## 2024-02-08 LAB — SURGICAL PATHOLOGY

## 2024-02-08 NOTE — Anesthesia Postprocedure Evaluation (Signed)
 Anesthesia Post Note  Patient: Jared Grimes  Procedure(s) Performed: COLONOSCOPY POLYPECTOMY, INTESTINE  Patient location during evaluation: Endoscopy Anesthesia Type: General Level of consciousness: awake and alert Pain management: pain level controlled Vital Signs Assessment: post-procedure vital signs reviewed and stable Respiratory status: spontaneous breathing, nonlabored ventilation and respiratory function stable Cardiovascular status: blood pressure returned to baseline and stable Postop Assessment: no apparent nausea or vomiting Anesthetic complications: no   No notable events documented.   Last Vitals:  Vitals:   02/07/24 1054 02/07/24 1059  BP:  (!) 146/84  Pulse: (!) 49 77  Resp: 17 19  Temp:    SpO2: 100% 100%    Last Pain:  Vitals:   02/07/24 1059  TempSrc:   PainSc: 0-No pain                 Camellia Merilee Louder

## 2024-02-09 ENCOUNTER — Ambulatory Visit: Payer: Self-pay | Admitting: Gastroenterology

## 2024-03-14 ENCOUNTER — Other Ambulatory Visit: Payer: Self-pay | Admitting: Neurology

## 2024-03-14 DIAGNOSIS — R29898 Other symptoms and signs involving the musculoskeletal system: Secondary | ICD-10-CM

## 2024-03-19 ENCOUNTER — Ambulatory Visit
Admission: RE | Admit: 2024-03-19 | Discharge: 2024-03-19 | Disposition: A | Discharge status: Home / Self Care | Source: Ambulatory Visit | Attending: Neurology | Admitting: Neurology

## 2024-03-19 DIAGNOSIS — R29898 Other symptoms and signs involving the musculoskeletal system: Secondary | ICD-10-CM | POA: Diagnosis present

## 2024-03-19 NOTE — Progress Notes (Signed)
 Spoke with Dr Loreli all output has been placed to Zero Pre MRI.

## 2024-03-29 ENCOUNTER — Other Ambulatory Visit: Payer: Self-pay | Admitting: Neurosurgery

## 2024-04-23 ENCOUNTER — Ambulatory Visit (HOSPITAL_COMMUNITY): Admit: 2024-04-23 | Admitting: Neurosurgery
